# Patient Record
Sex: Male | Born: 1955 | Hispanic: No | Smoking: Former smoker
Health system: Southern US, Community
[De-identification: ages and names within clinical notes are randomized; demographics above are authoritative.]

## PROBLEM LIST (undated history)

## (undated) DIAGNOSIS — E785 Hyperlipidemia, unspecified: Secondary | ICD-10-CM

## (undated) DIAGNOSIS — N189 Chronic kidney disease, unspecified: Secondary | ICD-10-CM

## (undated) DIAGNOSIS — I219 Acute myocardial infarction, unspecified: Secondary | ICD-10-CM

## (undated) DIAGNOSIS — I2510 Atherosclerotic heart disease of native coronary artery without angina pectoris: Secondary | ICD-10-CM

## (undated) DIAGNOSIS — I739 Peripheral vascular disease, unspecified: Secondary | ICD-10-CM

## (undated) DIAGNOSIS — I495 Sick sinus syndrome: Secondary | ICD-10-CM

## (undated) DIAGNOSIS — I509 Heart failure, unspecified: Secondary | ICD-10-CM

## (undated) DIAGNOSIS — Z95810 Presence of automatic (implantable) cardiac defibrillator: Secondary | ICD-10-CM

## (undated) HISTORY — PX: ANGIOPLASTY: SHX39

## (undated) HISTORY — PX: CORONARY ARTERY BYPASS GRAFT: SHX141

## (undated) HISTORY — DX: Chronic kidney disease, unspecified: N18.9

## (undated) HISTORY — DX: Hyperlipidemia, unspecified: E78.5

## (undated) HISTORY — DX: Sick sinus syndrome: I49.5

## (undated) HISTORY — DX: Atherosclerotic heart disease of native coronary artery without angina pectoris: I25.10

## (undated) HISTORY — DX: Heart failure, unspecified: I50.9

## (undated) HISTORY — DX: Peripheral vascular disease, unspecified: I73.9

## (undated) HISTORY — PX: HERNIA REPAIR: SHX51

## (undated) HISTORY — PX: CERVICAL FUSION: SHX112

## (undated) HISTORY — DX: Presence of automatic (implantable) cardiac defibrillator: Z95.810

---

## 2000-06-13 ENCOUNTER — Ambulatory Visit (HOSPITAL_COMMUNITY): Admission: RE | Admit: 2000-06-13 | Discharge: 2000-06-13 | Payer: Self-pay | Admitting: Infectious Diseases

## 2000-06-13 ENCOUNTER — Encounter: Payer: Self-pay | Admitting: Infectious Diseases

## 2006-02-23 ENCOUNTER — Encounter: Payer: Self-pay | Admitting: Vascular Surgery

## 2006-02-23 ENCOUNTER — Inpatient Hospital Stay (HOSPITAL_COMMUNITY): Admission: EM | Admit: 2006-02-23 | Discharge: 2006-03-04 | Payer: Self-pay | Admitting: Emergency Medicine

## 2007-06-07 ENCOUNTER — Ambulatory Visit: Payer: Self-pay | Admitting: Vascular Surgery

## 2007-06-20 ENCOUNTER — Ambulatory Visit: Payer: Self-pay | Admitting: Vascular Surgery

## 2007-09-26 ENCOUNTER — Ambulatory Visit: Payer: Self-pay | Admitting: Vascular Surgery

## 2007-12-28 ENCOUNTER — Encounter: Payer: Self-pay | Admitting: Endocrinology

## 2008-01-26 ENCOUNTER — Ambulatory Visit: Payer: Self-pay | Admitting: Endocrinology

## 2008-01-26 DIAGNOSIS — E0590 Thyrotoxicosis, unspecified without thyrotoxic crisis or storm: Secondary | ICD-10-CM | POA: Insufficient documentation

## 2008-01-26 DIAGNOSIS — F39 Unspecified mood [affective] disorder: Secondary | ICD-10-CM

## 2008-01-26 DIAGNOSIS — I10 Essential (primary) hypertension: Secondary | ICD-10-CM | POA: Insufficient documentation

## 2008-01-31 ENCOUNTER — Encounter: Admission: RE | Admit: 2008-01-31 | Discharge: 2008-01-31 | Payer: Self-pay | Admitting: Endocrinology

## 2008-02-06 ENCOUNTER — Telehealth (INDEPENDENT_AMBULATORY_CARE_PROVIDER_SITE_OTHER): Payer: Self-pay | Admitting: *Deleted

## 2008-09-24 ENCOUNTER — Ambulatory Visit: Payer: Self-pay | Admitting: Vascular Surgery

## 2009-02-03 LAB — LIPID PANEL
Cholesterol: 163 mg/dL (ref 0–200)
HDL: 47 mg/dL (ref 35–70)
LDL Cholesterol: 92 mg/dL
Triglycerides: 119 mg/dL (ref 40–160)

## 2009-02-07 ENCOUNTER — Ambulatory Visit (HOSPITAL_COMMUNITY): Admission: RE | Admit: 2009-02-07 | Discharge: 2009-02-07 | Payer: Self-pay | Admitting: Cardiovascular Disease

## 2009-02-27 ENCOUNTER — Encounter (HOSPITAL_COMMUNITY): Admission: RE | Admit: 2009-02-27 | Discharge: 2009-05-28 | Payer: Self-pay | Admitting: Cardiovascular Disease

## 2009-07-25 LAB — BASIC METABOLIC PANEL
BUN: 27 mg/dL — AB (ref 4–21)
Creatinine: 2.1 mg/dL — AB (ref <=1.3)

## 2009-09-19 ENCOUNTER — Ambulatory Visit: Payer: Self-pay | Admitting: Vascular Surgery

## 2010-05-29 ENCOUNTER — Ambulatory Visit: Payer: Self-pay | Admitting: Vascular Surgery

## 2010-06-05 ENCOUNTER — Ambulatory Visit (HOSPITAL_COMMUNITY): Admission: RE | Admit: 2010-06-05 | Discharge: 2010-06-05 | Payer: Self-pay | Admitting: General Surgery

## 2010-09-03 ENCOUNTER — Encounter: Payer: Self-pay | Admitting: Cardiovascular Disease

## 2010-09-03 DIAGNOSIS — E785 Hyperlipidemia, unspecified: Secondary | ICD-10-CM | POA: Insufficient documentation

## 2010-09-03 DIAGNOSIS — N189 Chronic kidney disease, unspecified: Secondary | ICD-10-CM | POA: Insufficient documentation

## 2010-09-03 DIAGNOSIS — I252 Old myocardial infarction: Secondary | ICD-10-CM | POA: Insufficient documentation

## 2010-09-03 DIAGNOSIS — Z951 Presence of aortocoronary bypass graft: Secondary | ICD-10-CM | POA: Insufficient documentation

## 2010-09-03 DIAGNOSIS — I2510 Atherosclerotic heart disease of native coronary artery without angina pectoris: Secondary | ICD-10-CM | POA: Insufficient documentation

## 2010-09-03 DIAGNOSIS — I739 Peripheral vascular disease, unspecified: Secondary | ICD-10-CM | POA: Insufficient documentation

## 2010-09-03 DIAGNOSIS — I5022 Chronic systolic (congestive) heart failure: Secondary | ICD-10-CM | POA: Insufficient documentation

## 2010-09-25 ENCOUNTER — Ambulatory Visit (INDEPENDENT_AMBULATORY_CARE_PROVIDER_SITE_OTHER): Payer: Self-pay | Admitting: Cardiovascular Disease

## 2010-09-25 DIAGNOSIS — E7800 Pure hypercholesterolemia, unspecified: Secondary | ICD-10-CM

## 2010-09-25 DIAGNOSIS — Z951 Presence of aortocoronary bypass graft: Secondary | ICD-10-CM

## 2010-10-28 LAB — SURGICAL PCR SCREEN: Staphylococcus aureus: POSITIVE — AB

## 2010-10-28 LAB — COMPREHENSIVE METABOLIC PANEL
ALT: 25 U/L (ref 0–53)
AST: 22 U/L (ref 0–37)
Albumin: 4.2 g/dL (ref 3.5–5.2)
Alkaline Phosphatase: 35 U/L — ABNORMAL LOW (ref 39–117)
BUN: 20 mg/dL (ref 6–23)
CO2: 26 mEq/L (ref 19–32)
Calcium: 9.5 mg/dL (ref 8.4–10.5)
Chloride: 106 mEq/L (ref 96–112)
Creatinine, Ser: 1.96 mg/dL — ABNORMAL HIGH (ref 0.4–1.5)
GFR calc Af Amer: 43 mL/min — ABNORMAL LOW (ref >60)
GFR calc non Af Amer: 36 mL/min — ABNORMAL LOW (ref >60)
Glucose, Bld: 99 mg/dL (ref 70–99)
Potassium: 4.6 mEq/L (ref 3.5–5.1)
Sodium: 138 mEq/L (ref 135–145)
Total Bilirubin: 0.7 mg/dL (ref 0.3–1.2)
Total Protein: 7.5 g/dL (ref 6.0–8.3)

## 2010-10-28 LAB — CBC
HCT: 42.9 % (ref 39.0–52.0)
Hemoglobin: 14.7 g/dL (ref 13.0–17.0)
MCH: 31 pg (ref 26.0–34.0)
MCHC: 34.3 g/dL (ref 30.0–36.0)
MCV: 90.2 fL (ref 78.0–100.0)
Platelets: 177 10*3/uL (ref 150–400)
RBC: 4.75 MIL/uL (ref 4.22–5.81)
RDW: 13.9 % (ref 11.5–15.5)
WBC: 8.1 10*3/uL (ref 4.0–10.5)

## 2010-10-28 LAB — DIFFERENTIAL
Basophils Absolute: 0 10*3/uL (ref 0.0–0.1)
Basophils Relative: 1 % (ref 0–1)
Eosinophils Absolute: 0.2 10*3/uL (ref 0.0–0.7)
Eosinophils Relative: 3 % (ref 0–5)
Lymphocytes Relative: 18 % (ref 12–46)
Lymphs Abs: 1.4 10*3/uL (ref 0.7–4.0)
Monocytes Absolute: 0.6 10*3/uL (ref 0.1–1.0)
Monocytes Relative: 8 % (ref 3–12)
Neutro Abs: 5.8 10*3/uL (ref 1.7–7.7)
Neutrophils Relative %: 71 % (ref 43–77)

## 2010-10-28 LAB — PROTIME-INR
INR: 0.96 (ref 0.00–1.49)
Prothrombin Time: 13 seconds (ref 11.6–15.2)

## 2010-11-23 ENCOUNTER — Other Ambulatory Visit: Payer: Self-pay | Admitting: Cardiovascular Disease

## 2010-11-23 DIAGNOSIS — I10 Essential (primary) hypertension: Secondary | ICD-10-CM

## 2010-11-23 LAB — BASIC METABOLIC PANEL
BUN: 21 mg/dL (ref 6–23)
CO2: 18 mEq/L — ABNORMAL LOW (ref 19–32)
Calcium: 9.4 mg/dL (ref 8.4–10.5)
Chloride: 107 mEq/L (ref 96–112)
Creatinine, Ser: 1.78 mg/dL — ABNORMAL HIGH (ref 0.4–1.5)
GFR calc Af Amer: 48 mL/min — ABNORMAL LOW (ref >60)
GFR calc non Af Amer: 40 mL/min — ABNORMAL LOW (ref >60)
Glucose, Bld: 131 mg/dL — ABNORMAL HIGH (ref 70–99)
Potassium: 4.4 mEq/L (ref 3.5–5.1)
Sodium: 139 mEq/L (ref 135–145)

## 2010-11-23 LAB — CBC
HCT: 42.1 % (ref 39.0–52.0)
Hemoglobin: 14.2 g/dL (ref 13.0–17.0)
MCHC: 33.8 g/dL (ref 30.0–36.0)
MCV: 91 fL (ref 78.0–100.0)
Platelets: 163 10*3/uL (ref 150–400)
RBC: 4.62 MIL/uL (ref 4.22–5.81)
RDW: 13.6 % (ref 11.5–15.5)
WBC: 6.1 10*3/uL (ref 4.0–10.5)

## 2010-11-23 LAB — PROTIME-INR
INR: 1 (ref 0.00–1.49)
Prothrombin Time: 13.5 seconds (ref 11.6–15.2)

## 2010-11-23 LAB — APTT: aPTT: 28 seconds (ref 24–37)

## 2010-11-23 MED ORDER — LISINOPRIL 5 MG PO TABS: 5.0000 mg | ORAL_TABLET | Freq: Every day | ORAL | Status: DC

## 2010-11-23 NOTE — Telephone Encounter (Signed)
Fax received from pharmacy. Jodette Suheyb Raucci RN  

## 2010-11-24 ENCOUNTER — Other Ambulatory Visit: Payer: Self-pay | Admitting: Cardiovascular Disease

## 2010-11-24 DIAGNOSIS — I10 Essential (primary) hypertension: Secondary | ICD-10-CM

## 2010-11-24 MED ORDER — LISINOPRIL 5 MG PO TABS: 5.0000 mg | ORAL_TABLET | Freq: Every day | ORAL | Status: DC

## 2010-11-24 NOTE — Telephone Encounter (Signed)
Fax received from pharmacy. Refill completed. Jodette Cyntia Staley RN  

## 2010-12-25 ENCOUNTER — Other Ambulatory Visit: Payer: Self-pay | Admitting: Cardiovascular Disease

## 2010-12-29 NOTE — Assessment & Plan Note (Signed)
OFFICE VISIT   Logan Burton, Logan Burton  DOB:  1956-03-31                                       09/26/2007  CHART#:[id number]   I saw the patient in the office today for continued followup of his  peripheral vascular disease.  I had originally seen him in consultation  November 2008.  At that time, he had evidence of bilateral infrainguinal  arterial occlusive disease.  ABI on the right was 45% and on the left  59%.  He had successfully quit smoking after his open heart surgery in  July 2007.  We discussed the importance of a structured walking program,  and I also wrote him a prescription for cilostazol.  He comes in for a  routine followup visit.   Since I saw him last, he states the symptoms have improved somewhat.  He  is able to walk approximately 1/2 mile before experiencing symptoms.  His symptoms are more significant on the right side.  He experiences  calf claudication, which is relieved with rest.  He has had no history  of rest pain and no history of non-healing wounds.   REVIEW OF SYSTEMS:  He has had no recent chest pain, chest pressure,  palpitations, or arrhythmias.  He has had no bronchitis, asthma, or wheezing.   PHYSICAL EXAMINATION:  This is a pleasant 55 year old gentleman who  appears his stated age.  Blood pressure 122/80, heart rate is 67.  I do  not detect any carotid bruits.  Lungs clear bilaterally to auscultation.  On cardiac exam, he has a regular rate and rhythm.  His abdomen is soft  and nontender.  He has palpable femoral pulses.  I cannot palpate  popliteal or pedal pulses on either side.  Both feet appear adequately  perfused.  He has no ischemic ulcers on his feet.  He has no significant  lower extremity swelling.   Doppler studies in our office today show ABI of 60% on the right and 83%  on the left.  Both of these have improved compared to the study in  November.  He has monophasic Doppler signals in both feet.   Overall, I am  pleased with his progress.  I have written a prescription  for more cilostazol, as he does think that this helps.  I plan on seeing  him back in 1 year.  He knows to call sooner if he has problems.  We  have again discussed the importance of staying active and trying to get  on a structured walking program.   Di Kindle. Edilia Bo, M.D.  Electronically Signed   CSD/MEDQ  D:  09/26/2007  T:  09/28/2007  Job:  [id number]   cc:   Vesta Mixer, M.D.

## 2010-12-29 NOTE — Assessment & Plan Note (Signed)
OFFICE VISIT   Logan Burton, Logan Burton  DOB:  Dec 21, 1955                                       09/24/2008  CHART#:[id number]   I saw the patient in the office today for continued followup of his  peripheral vascular disease.  I last saw him a year ago in February  2009.  Since that time his claudication symptoms have remained stable.  He experiences claudication in both calves associated with ambulation  and relieved with rest.  He can walk for half a mile before experiencing  symptoms.  His symptoms have been stable and have not changed  significantly.  There are no other aggravating or alleviating factors.  He denies any history of rest pain and he has had no history of  nonhealing ulcers.   PAST MEDICAL HISTORY:  Significant for previous myocardial infarction in  2007 and also a history of hypertension.   SOCIAL HISTORY:  He quit tobacco in 2007 after his heart attack.   REVIEW OF SYSTEMS:  He has had no recent chest pain, chest pressure,  palpitations or arrhythmias.  He has had no bronchitis, asthma or  wheezing.   PHYSICAL EXAMINATION:  This is a pleasant 55 year old gentleman who  appears his stated age.  Blood pressure is 116/72, heart rate is 73.  Neck is supple.  There is no cervical lymphadenopathy.  I did not detect  any carotid bruits.  Lungs are clear bilaterally to auscultation.  Cardiac exam: He has a regular rate and rhythm.  He has a diminished  right femoral pulse with a palpable left femoral pulse.  I cannot  palpate popliteal or pedal pulses on either side.  Both feet appear  adequately perfused without ischemic ulcers.   Doppler study in our office today shows an ABI of 61% on the right and  79% on the left.  These are stable compared to a year ago.  On the right  side he has a monophasic dorsalis pedis and posterior tibial signal.  On  the left side he has a biphasic posterior tibial signal and a monophasic  dorsalis pedis signal.   Overall, I am pleased with his progress and I have encouraged him to  continue to stay on his structured walking program.  We will see him  back in 1 year for followup ABIs.  He knows to call sooner if he has  problems.   Di Kindle. Edilia Bo, M.D.  Electronically Signed   CSD/MEDQ  D:  09/24/2008  T:  09/25/2008  Job:  [id number]   cc:   Vesta Mixer, M.D.

## 2010-12-29 NOTE — Cardiovascular Report (Signed)
NAMEOLUWADARASIMI, REDMON NO.:  [id number]   MEDICAL RECORD NO.:  [id number]          PATIENT TYPE:  OIB   LOCATION:  2899                         FACILITY:  [facility]   PHYSICIAN:  Vesta Mixer, M.D. DATE OF BIRTH:  [birth year]   DATE OF PROCEDURE:  02/07/2009  DATE OF DISCHARGE:  02/07/2009                            CARDIAC CATHETERIZATION   Kolston Lacount is a 55 year old gentleman with a history of an inferolateral  myocardial infarction.  He underwent emergent coronary artery bypass  grafting 3 years ago.  He has recently been having severe shortness of  breath with exertion.  He had a stress echocardiogram, which was  abnormal.  He had LV dilatation and developed severe shortness of  breath.  He does not have any specific chest pain.  He was referred for  heart catheterization for further evaluation.   PROCEDURE:  Left heart catheterization with coronary angiography.   Right femoral artery was easily cannulated using a modified Seldinger  technique.   HEMODYNAMICS:  LV pressure was 131/11 with an aortic pressure of 131/75.   Left anterior descending artery is essentially normal.   The left anterior descending artery is severely and diffusely diseased.  There was a long 70% stenosis in the mid vessel.  There was a 50%  stenosis at the insertion of the left internal mammary artery.  Competitive flow can be seen from LIMA.   The left circumflex artery is a large vessel.  It gives off a very large  obtuse marginal artery, which then quickly branches into two very large  branches.  These were previously bypassed.  There was a subtotal  occlusion just after the bifurcation of these vessels.  There was TIMI  grade 1 flow down these vessels.  There was no evidence of competitive  flow from the saphenous vein graft.   The right coronary artery is severely diseased and is small.  It  supplied a very small posterior descending artery.   The saphenous vein graft to  the posterior descending artery is a very  small graft, but without critical stenosis.  It anastomosis to a very  small posterior descending artery.   The saphenous vein graft to the obtuse marginal artery is occluded  proximally.   The left internal mammary artery is patent.  The vessel is somewhat  tortuous.  At the anastomosis to the LAD, there was a 50% stenosis.  There was evidence of competitive flow from the native LAD.   The left ventriculogram was performed in a 30 RAO position.  It reveals  moderate left ventricular dysfunction.  The apex contracts fairly well.  The anterior base and the posterolateral wall are somewhat akinetic.  The ejection fraction is 35-40%.   COMPLICATIONS:  None.   CONCLUSIONS:  1. Severe coronary artery disease.  His left circumflex system is now      un-revascularized.  His left anterior descending artery is      partially revascularize.  I do not think that we will be able to      improve the flow down its circumflex  system with angioplasty or      stenting.  We will review these films with the surgeons.      Vesta Mixer, M.D.  Electronically Signed     PJN/MEDQ  D:  02/07/2009  T:  02/08/2009  Job:  [id number]   cc:   Evelene Croon, M.D.

## 2010-12-29 NOTE — H&P (Signed)
Logan Burton, RADIN NO.:  [id number]   MEDICAL RECORD NO.:  [id number]           PATIENT TYPE:   LOCATION:                                 FACILITY:   PHYSICIAN:  Vesta Mixer, M.D.      DATE OF BIRTH:   DATE OF ADMISSION:  02/07/2009  DATE OF DISCHARGE:                              HISTORY & PHYSICAL   Mr. Cobern is a middle-aged gentleman with a history of coronary artery  disease.  He is status post coronary artery bypass grafting.  He was  admitted to the hospital after having an abnormal stress echocardiogram.   Mr. Seals is a 55 year old gentleman, who presented with an acute  inferolateral myocardial infarction in July 2007.  We were unable to  open his circumflex artery by angioplasty, and he went for urgent  coronary artery bypass grafting.  He had the left internal mammary  artery to the LAD, a sequential saphenous vein graft to the second and  third obtuse marginal artery and a saphenous vein graft to the posterior  descending artery.  The patient had somewhat of a slow recovery.  He  continued to have episodes of intermittent chest pain and some shortness  breath.  Recently, he complained of severe shortness of breath with  exertion.  These episodes occur any time he exerts himself.  He is able  to walk fairly slowly at a comfortable pace, but any time he walks a  bit, he becomes very short of breath.  He denies any real chest pain.  He denies any PND or orthopnea.  He denies any nausea, vomiting,  syncope, or presyncope.   He had a stress echocardiogram today.  He was able to walk for 6 minutes  on a standard Bruce protocol treadmill test.  With exercise, he had left  ventricular dilatation.  His overall ejection fraction decreased from  the 40% range down to about 30%.  He is scheduled for heart  catheterization for further evaluation.   CURRENT MEDICATIONS:  1. Metoprolol 75 mg twice a day.  2. Aspirin 325 mg a day.  3. Lisinopril 5 mg a  day.  4. Citalopram 40 mg a day.  5. TriCor 145 mg a day.  6. Crestor 20 mg a day.   ALLERGIES:  None.   PAST MEDICAL HISTORY:  1. Coronary artery disease - status post coronary artery bypass      grafting.  2. History of myocardial infarction.  3. Congestive heart failure with an ejection fraction of around 40%.  4. Chronic renal insufficiency.  His creatinine has slowly climbed up,      and his most recent creatinine level is 2.0.  5. Peripheral vascular disease.  This was followed by Dr. Cari Caraway.  He has episodes of claudication, practically in his right      leg.  His ABI on the right side was 61%, and his ABI on the left      side is 79%.  6. History dyslipidemia.   SOCIAL HISTORY:  The patient  has a history of smoking in the past but  does not smoke since his heart attack.  He does not drink alcohol.   FAMILY HISTORY:  Positive for cardiac disease.   REVIEW OF SYSTEMS:  Reviewed, in the HPI.  He denies any heat or cold  intolerance.  He denies any weight gain or weight loss.  He denies any  blood in his urine or blood in his stool.  He denies any rash or skin  nodules.  He denies any problems with his eyes, ears, nose, and throat.  He denies any skin problems.  All other systems were reviewed and are  negative.   PHYSICAL EXAMINATION:  GENERAL:  He is a middle-aged gentleman, in no  acute distress.  He is alert and oriented x3, and his mood and affect  are normal.  NECK:  Supple.  He has 2+ carotids.  There are no bruits.  He has no  JVD.  HEENT:  His sclerae are nonicteric.  His mucous membranes are moist.  LUNGS:  Clear.  BACK:  Nontender.  HEART:  Regular rate.  His PMI is nondisplaced.  ABDOMEN:  Good bowel sounds.  There is no hepatosplenomegaly.  There is  no guarding or rebound.  EXTREMITIES:  No clubbing, cyanosis, or edema.  He has 1+ pulses in the  femoral arteries.  There is no palpable cords.  There is no rash.  SKIN:  Unremarkable.   NEUROLOGIC:  Nonfocal.  His gait is normal.   Mr. Jaros presents with dyspnea on exertion and has a positive stress  echocardiogram.  He has left ventricular dilatation with reduced left  ventricular systolic function with exercise.  We will need to proceed  with heart catheterization for further evaluation.  We have discussed  the risks, benefits, and options of heart catheterization.  He  understands and agrees to proceed.      Vesta Mixer, M.D.  Electronically Signed     PJN/MEDQ  D:  02/05/2009  T:  02/06/2009  Job:  [id number]   cc:   Evelene Croon, M.D.  Oley Balm Georgina Pillion, M.D.

## 2010-12-29 NOTE — Consult Note (Signed)
NEW PATIENT CONSULTATION   Logan Burton, Logan Burton  DOB:  08-Aug-1956                                       06/20/2007  CHART#:[id number]   I saw Logan Burton in the office today in consultation concerning his  bilateral lower extremity claudication.  He was referred by Vesta Mixer, M.D.   This is a pleasant 55 year old gentleman who states that he has pain in  his both calves associated with ambulation and relieved with rest for  over a year.  His symptoms have gradually progressed over the last six  months.  He occasionally gets paresthesias on his right foot.  He has  had no significant thigh or hip claudication.  He denies any history of  rest pain in his feet and has had no history of nonhealing ulcers.  His  symptoms occur at approximately 400 feet.   PAST MEDICAL HISTORY:  Significant for:  1. Coronary artery disease.  He underwent coronary revascularization      by Evelene Croon, M.D. in July of 2007.  2. In addition he has hypertension and hypercholesterolemia.  3. He denies any history of diabetes, history of previous myocardial      infarction, history of congestive heart failure or history of COPD.   PAST MEDICAL HISTORY:  Significant for:  1. Coronary artery bypass surgery in July of 2007.  2. Cervical disk surgery in 1996.   FAMILY HISTORY:  There is no history of premature cardiovascular  disease.   SOCIAL HISTORY:  He is divorced.  He does not have any children.  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  He had smoked  two packs-per-day of cigarettes since he was 55 years old, but quit when  he had his open heart surgery in July of 2007.   MEDICATIONS:  1. Aspirin 325 mg daily.  2. Metoprolol.  3. Lisinopril.  4. Citalopram.  5. Simvastatin.   REVIEW OF SYSTEMS:  GENERAL:  He has about a 20 pounds weight gain since  his coronary artery bypass surgery which maybe related to his tobacco  cessation.  He has had no problems  with his appetite or fever.  CARDIAC:  He does admit to dyspnea on exertion.  ORTHO/__________  :  He does  occasionally get some joint pain.  PULMONARY, GI, GU, NEURO,  PSYCHIATRIC, ENT AND HEMATOLOGIC REVIEW OF SYSTEMS:  Unremarkable.   PHYSICAL EXAMINATION:  This is a pleasant 55 year old gentleman who  appears his stated age and is in no acute distress.  Blood pressure is 123/73, heart rate is 78, saturation is 98%.  HEENT:  I do not detect any carotid bruits.  There is no cervical  lymphadenopathy.  LUNGS:  Clear bilaterally to auscultation.  CARDIAC EXAM:  He has a regular rate and rhythm.  ABDOMEN:  Soft and nontender.  He has normal pitch bowel sounds.  VASCULAR EXAM:  Reveals palpable radial and femoral pulses bilaterally.  I cannot palpate popliteal or pedal pulses on either side.  EXTREMITIES EXAM:  Reveals no significant lower extremity swelling.  He  has had vein harvested from the right leg previously for his open heart  surgery.  He has no ischemic ulcers and no rashes.  NEUROLOGIC EXAM:  He has no focal weakness or paresthesias.   He did have Doppler studies done in  our office which showed an ABI of  45% on the right and 59% on the left.  Doppler signals are monophasic in  both feet.   Logan Burton has evidence of infrainguinal arterial occlusive disease  bilaterally with fairly stable claudication.  I have encouraged him to  remain off of his cigarettes and he seems highly motivated for this.  We  have also discussed the importance of a structured walking program.  In  addition, I have given him a prescription for cilostazol.  We will try  to avoid revascularization unless his symptoms progress and became  disabling or he developed rest pain and a nonhealing ulcer.  I plan on  seeing him back in four months to follow his progress.  He knows to call  sooner if he has problems.  He understands that if his symptoms progress  we would proceed with  arteriography to see what  options we might have for revascularization.  Of note his options on the right will be limited as he has previously  had his vein taken for his open heart surgery.   Di Kindle. Edilia Bo, M.D.  Electronically Signed   CSD/MEDQ  D:  06/20/2007  Burton:  06/21/2007  Job:  [id number]   cc:   Vesta Mixer, M.D.

## 2011-01-01 NOTE — Cardiovascular Report (Signed)
NAMEJUANCARLOS, CRESCENZO NO.:  [id number]   MEDICAL RECORD NO.:  [id number]          PATIENT TYPE:  INP   LOCATION:  2807                         FACILITY:  [facility]   PHYSICIAN:  Vesta Mixer, M.D. DATE OF BIRTH:  1956-07-30   DATE OF PROCEDURE:  02/23/2006  DATE OF DISCHARGE:                              CARDIAC CATHETERIZATION   Mr. Hochmuth is a 55 year old gentleman with a history of hypertension and  cigarette smoking.  He presents to the hospital with 4 days of chest pain.  He was found to have ST-segment elevation in the inferolateral leads.  He is  referred for heart catheterization and emergent PCI.   PROCEDURE:  Left heart catheterization.  The attempt at PTCA of the first  obtuse marginal artery was unsuccessful.   The right femoral artery was easily cannulated using a modified Seldinger  technique.  The patient received 4000 units of heparin and a double-bolus  Integrilin drip after we gained access.  Because of a smoking history he was  having trouble maintaining his oxygen saturations.  We put him on 100% non-  rebreather resulting in 100% saturation of his arterial blood.   HEMODYNAMICS:  LV pressure was 113/22 with an aortic pressure of 115/73.   ANGIOGRAPHY:  Left main:  There are minor luminal irregularities.   The left anterior descending artery has proximal 50% stenosis just prior to  the first septal branch.  There is then an eccentric 90% stenosis.  There  are mild to moderate irregularities following this stenosis between 30% and  40%.  There are several small diagonal branches but no large diagonal  branches.   The left circumflex artery is an extremely large and dominant vessel.  There  is a very small first marginal vessel that has only minimal luminal  irregularities.  There appears to be a large second obtuse marginal artery  that originates at a bend in the vessel.  This second obtuse marginal artery  is occluded.  There is a very  faint amount of blood flow that seems to be  going down into that vessel although it is not clear.   The distal circumflex continues around in the AV groove.  There is a 60%  stenosis after the takeoff of the second obtuse marginal artery.  There is a  70-80% stenosis in the vessel as it gives off the posterior descending  artery.   The right coronary artery is very small and nondominant.  It supplies artery  marginal branches only.  It does supply a very, very tiny amount of the  inferior left ventricle.   Left ventriculogram was performed in a 30 RAO position.  It reveals global  left ventricular dysfunction with an ejection fraction of around 30%.  There  is lateral akinesis.  There is global hypokinesis.  There is preserved  contractility of the anterior wall.  There is moderate to severe hypokinesis  of the inferior wall.   PTCA:  The 6-French sheath was traded out for a 7-French sheath.  The left  main was engaged using a Judkins  left 4, 7-French guide.  The ACT was 234.   An Clinical cytogeneticist wire was placed down this vessel.  We made multiple  attempts to get down into the second obtuse marginal artery.  We were able  to track partially down but were never able to actually establish the fact  that we were in the lumen.  A followup angiography revealed just persistent  dye staining as it had been previously.  We were clearly just embedded in  the plaque and were not successful in getting across this stenosis.  The  stenosis was very hard and difficult and it feels to be much older than 65  days old.  After much thought and consideration we decided to stop the  procedure at this time.  It appears that further attempts would possibly  result in dissection of the vessel.  The troponin at this point came back at  45, indicating that this infarction has been going on for quite some time.  There is probably very little to gain from further attempts and, in fact,  very little to gain by  opening the vessel acutely.  Given the fact that  there is considerable risk of dissection, we decided to stop the procedure.   The patient needs to have his left anterior descending artery  revascularized.  Our options would include a very long stent of the proximal  and mid LAD.  The tight stenosis has at least 30 mm of moderate disease just  prior to that where it would not be feasible to the land the proximal edge  of a stent.  We may need to consider coronary artery bypass grafting.  We  will review the films.  The patient is stable leaving laboratory and in fact  has stated that his chest pain is better.  The patient has been advised to  stop smoking.   The patient received metoprolol 5 mg x2.  This helped with his tachycardia  and helped relieve his pain.           ______________________________  Vesta Mixer, M.D.     PJN/MEDQ  D:  02/23/2006  T:  02/23/2006  Job:  [id number]   cc:   Oley Balm. Georgina Pillion, M.D.  Fax: [phone]

## 2011-01-01 NOTE — Op Note (Signed)
NAMEHARUTYUN, Logan Burton NO.:  [id number]   MEDICAL RECORD NO.:  [id number]          PATIENT TYPE:  INP   LOCATION:  2301                         FACILITY:  [facility]   PHYSICIAN:  Kaylyn Layer. Michelle Piper, M.D.   DATE OF BIRTH:  [birth year]   DATE OF PROCEDURE:  DATE OF DISCHARGE:                                 OPERATIVE REPORT   PROCEDURE:  Transesophageal echocardiographic probe placement and monitoring  during coronary artery bypass grafting.   HISTORY:  I was consulted by Dr. Evelene Croon to place a transesophageal  echocardiogram probe into the patient who presented after having had a  significant myocardial infarction for coronary artery bypass grafting.  It  was noted on his catheterization that his inferior and lateral walls had  significant wall motion abnormalities and with a depressed ventricle in the  face of an acute MI, indications for transesophageal echocardiography was  there.   DESCRIPTION OF PROCEDURE:  After uneventful induction of anesthesia and  endotracheal intubation, the transesophageal echocardiogram was easily  passed into the patient's stomach.  Initial short access view of the left  ventricle showed some inferior wall hypokinesis as well as significant  lateral dyskinesis, however, the septal function was normal.  Turning to the  mitral valve, the valve leaflets capped normally.  Structure of the valve  looked normal in all views.  Placing color flow Doppler across the valve was  trace to 1+ mitral regurgitation into the left atrium.  Left atrium was  normal flow and the pulmonary veins was forward.  Interatrial septum was  normal.  Turning to the aortic valve, valve was tri-leaflet.  Structure  looked normal and there was no significance on placing color flow across the  valve and in the left ventricular outflow tract there was no evidence of  aortic insufficiency or aortic valve disorders.  Right side of the heart was  within normal limits.   The patient was placed on cardiopulmonary bypass by Dr. Laneta Simmers and underwent  coronary artery bypass grafting times 4.  At the end of the bypass just  prior to discontinuation from cardiopulmonary bypass, did another evaluation  which was grossly unchanged.  We discontinued from cardiopulmonary bypass  with minimal inotropes.  At this time the left ventricle showed depressed  function in the inferior wall on the cross section view as well as the  akinesis of the lateral wall.  The inferior wall looked somewhat improved  from preoperative.  Mitral valve, left atrium, aortic valve, interatrial  septum and right side of the heart all were unchanged from preoperative.   At the end of the procedure the transesophageal echo probe was removed from  the patient and the patient was taken in stable condition to the Intensive  Care Unit.           ______________________________  Kaylyn Layer. Michelle Piper, M.D.     KDO/MEDQ  D:  02/25/2006  T:  02/26/2006  Job:  [id number]   cc:   ANESTHESIA DEPARTMENT

## 2011-01-01 NOTE — Discharge Summary (Signed)
Logan Burton, Logan Burton NO.:  [id number]   MEDICAL RECORD NO.:  [id number]          PATIENT TYPE:  INP   LOCATION:  2037                         FACILITY:  [facility]   PHYSICIAN:  Evelene Croon, M.D.     DATE OF BIRTH:  04/29/1956   DATE OF ADMISSION:  02/23/2006  DATE OF DISCHARGE:                                 DISCHARGE SUMMARY   PRIMARY DIAGNOSIS:  Severe 3 vessel coronary artery disease status post  large inferior lateral myocardial infarction.   IN HOSPITAL DIAGNOSES:  1.  Pericarditis.  2.  Acute blood loss anemia postoperatively.  3.  Postoperative pulmonary edema.  4.  Volume overload postoperatively.   SECONDARY DIAGNOSES:  1.  Hypertension.  2.  Hyperlipidemia.  3.  Status post neck surgery for disk disease.  4.  History of lower spinal disk bulging.   IN HOSPITAL OPERATIONS/PROCEDURES:  1.  Cardiac catheterization with left heart catheterization in attempt at      PTCA of the first obtuse marginal artery, which was unsuccessful.  2.  Emergency coronary artery bypass grafting x4, using left internal      mammary artery graft to left anterior descending coronary      artery,sequential saphenous vein graft second, third obtuse marginal      branches of the left circumflex coronary, saphenous vein graft to      posterior descending branch of left circumflex coronary.  Saphenous vein      harvesting from right leg was done.  3.  Transesophageal echocardiogram cardiographic probe placement and      monitoring during coronary artery bypass grafting.   HISTORY AND PHYSICAL AND HOSPITAL COURSE:  The patient is a 55 year old  gentleman, who was admitted on February 23, 2006 with a 4-day history of chest  pain.  Electrocardiogram was done, which showed a inferior lateral ST-  elevation.  His initial CPK was elevated 2,999 with an MB of 277 and  troponin of 0.45.  The patient was taken to the Cath Lab emergently and this  showed the culprit to be a completely  occluded second marginal branch.  Left  circumflex is the dominant vessel.  There is a small first marginal branch.  Second marginal could not be visualized, it appears to cover a large area.  There are very faint collaterals to the lateral wall.  There is also 70% to  80% systolic circumflex stenosis before the posterior descending branch.  The right coronary with a nondominant  vessel had about 60% to 70% proximal  stenosis.  The LAD with large vessel was about 50% proximal disease defuse  and then 90% stenosis proximally.  Ejection fraction was about 30%.  Following catheterization, Dr.  Laneta Simmers was consulted.  The patient continued  to have some mild chest discomfort as well as pain in his left shoulder,  which was felt to be pericarditis.  He did have some asymmetric pulmonary  edema in chest x-ray.  The patient was started on Integrilin and was  continued to follow closely.  The plan was for patient to undergo surgery on  Monday February 28, 2006.  Cardiac enzymes were repeated, and CPK isoenzymes and  troponin slowly decreased as repeat electrocardiogram showed no acute  changes.  On February 25, 2006, the patient was seen by Dr. Elease Hashimoto, the patient  had stated that he did not experience any chest pain and was feeling quite  good.  Shortly after this, the patient began developing worsening pain in  his left chest and shoulder.  A repeat EKG was done, which showed no  significant changes.  He had no arrhythmias and remained hemodynamically  stable.  At that point, Dr. Laneta Simmers evaluated the patient and it was felt  mostly that this was ongoing pericarditis due to the large infarct.  Unfortunately, there is no way to be sure about this and Dr. Laneta Simmers  recommended undergoing emergency coronary artery bypass grafting.  He  discussed risks and benefits with the patient.  The patient acknowledged  understanding and agreed to proceed.   The patient was taken emergently to the operating room on February 25, 2006,  where he underwent emergent coronary artery bypass grafting x4 using a left  internal mammary artery graft to left anterior descending  coronary,sequential saphenous vein graft to second and third obtuse marginal  branches as well as circumflex coronary, saphenous vein graft to second and  third obtuse marginal branches as well as circumflex coronary, sequential  saphenous vein graft to posterior same branch of left circumflex coronary.  Endoscopic vein harvesting was done from the right leg.  The patient did  this procedure well and was transferred up to the Intensive Care Unit in  stable condition.  Immediately following surgery, the patient seemed to be  hemodynamically stable.  During his postoperative course he did develop  acute blood loss anemia.  Postoperatively, hemoglobin and hematocrit dropped  to 7.2 and 21.  The patient did receive 2 units of packed red blood cells at  that time .  Hemoglobin and hematocrit were monitored and it seemed to be  increased on postoperative day 2 to 8.4 and 25.  Over the next several days  this was monitored.  It remained stable.  By postoperative day 6, it had  increased to 9.6 and 29.4.  Also during the patient's postoperative course  he was seen to have persistent pulmonary edema.  He required excessive  diuresis.  He required BiPAP following extubation.  With excessive diuresis  and pulmonary toilet the patient was able to be weaned off BiPAP.  Chest x-  ray improved.  The patient continued to receive aggressive pulmonary toilet.  By postoperative day 6, he was able to be weaned off oxygen sating greater  than 90% on room air.  The patient's weight was also back near baseline  prior to discharge home.  Preoperative weight was 163 and he was at 164  pounds postoperative day 6.  The patient's chest tubes and lines were discontinued in the normal fashion.  He was transferred on postoperative day  4.  The patient did have some hypotensive  episodes and beta blockers as well  as HCTZ were held at that time.  Postoperative day 6, blood pressure  improved with systolic blood pressure being in the 120s.  He was started on  low-dose beta blocker at that time.  His blood pressure remained stable,  restart the patient's HCTZ.  The patient remained in normal sinus rhythm  during his postoperative course.  His incision was clean, dry and intact and  healing well.  He was  seen to have hyperglycemia preoperatively.  Did  require some Lantus insulin postoperatively.  He was able to be discontinued  from the Lantus insulin and site blood sugars remained stable.  The patient  was out of bed ambulating well.  He remained afebrile during his  postoperative course.  Creatinine was not entered and seemed to be stable as  well.  Last creatinine obtained was 7.3 on postoperative day 6.  A BNP was  checked on postoperative day 6, and seemed to be elevated at 1798.  The  patient was continued on diuretics and this was monitored.   DISCHARGE INSTRUCTIONS:  The patient pensively ready for discharge home in  the next 1 to 2 days.  A followup appointment has been arranged with Dr.  Laneta Simmers, for March 22, 2006 at 11:45 a.m.  The patient is to follow up with  Dr. Elease Hashimoto in 2 weeks.  He is to contact Dr. Elease Hashimoto for followup  appointment.  Mr. Plascencia received instructions on diet, activity level, and  incisional care.  He was told no driving till released to do so, no heavy  lifting over 10 pounds.  The patient told that he is allowed to shower,  washing his incisions using soap and waster.  He is to contact the office if  he develops any drainage or any openings from any of his incisions sites.  The patient was told to ambulate 3 to 4 times per day, progress as tolerated  and to continue his breathing exercises.  He was educated on diet to be low  fat, low salt.  He acknowledged understanding.   DISCHARGE MEDICATIONS:  1.  Aspirin 325 mg daily.  2.   Toprol XL 25 mg daily.  3.  Lexapro  20 mg daily.  4.  Diovan 160/25 mg daily.  5.  Oxycodone 50 mg 1-2 tabs every 4-6 hours p.r.n. pain.      Theda Belfast, [place]      Evelene Croon, M.D.  Electronically Signed    KMD/MEDQ  D:  03/03/2006  T:  03/03/2006  Job:  [id number]   cc:   Evelene Croon, M.D.  [address]  [place]  [place] [address]   Vesta Mixer, M.D.  Fax: [phone]

## 2011-01-01 NOTE — Op Note (Signed)
NAMEJEREMIE, GIANGRANDE NO.:  [id number]   MEDICAL RECORD NO.:  [id number]          PATIENT TYPE:  INP   LOCATION:  2301                         FACILITY:  [facility]   PHYSICIAN:  Evelene Croon, M.D.     DATE OF BIRTH:  [birth year]   DATE OF PROCEDURE:  02/25/2006  DATE OF DISCHARGE:                                 OPERATIVE REPORT   PREOPERATIVE DIAGNOSIS:  Severe three vessel coronary disease status post  large inferolateral myocardial infarction.   POSTOPERATIVE DIAGNOSIS:  Severe three vessel coronary disease status post  large inferolateral myocardial infarction.   OPERATIVE PROCEDURE:  Emergency median sternotomy, extracorporeal  circulation, coronary bypass graft surgery x4 using a left internal mammary  artery graft to the left anterior descending coronary, sequential saphenous  vein graft to the second and third obtuse marginal branches of the left  circumflex coronary, and a saphenous vein graft to the posterior descending  branch of the left circumflex coronary.  Endoscopic vein harvesting from the  right leg.   ATTENDING SURGEON:  Evelene Croon, M.D.   ASSISTANT:  Theda Belfast, P.A.-C.   ANESTHESIA:  General endotracheal.   CLINICAL HISTORY:  This patient is a 55 year old gentleman who was admitted  on February 23, 2006, with a four day history of chest pain.  Electrocardiogram  showed inferolateral ST elevation.  His initial CPK was elevated to 2999  with an MB 277 and troponin 0.45.  He was taken to the cath lab emergently  and this showed the culprit to be a completely occluded second marginal  branch.  The left circumflex was a dominant vessel.  There was a small first  marginal branch.  The second marginal could not be visualized but appeared  to cover a large area.  There were very faint collaterals to the lateral  wall.  There was also a 70-80% distal left circumflex stenosis before the  posterior descending branch.  The right coronary was a  nondominant vessel  that had about a 60-70% proximal stenosis.  The LAD was a large vessel was  50% proximal diffuse disease and then a 90% stenosis proximally.  Ejection  fraction was about 30%.  He continued to have some mild chest discomfort as  well as pain in his left shoulder which was felt to be pericarditis.  He did  have some asymmetric pulmonary edema on chest x-ray.  He was started on  Integrilin and we continued to follow him closely.  His CPK isoenzymes and  troponin enzymes slowly decreased as repeat electrocardiogram showed no  acute changes.  This morning when Dr. Elease Hashimoto saw him, he was not complaining  of any pain and looked quite good, but then shortly after that began  developing worsening pain in his left chest and shoulder.  When I saw him, I  felt that the pain was unchanged from his previous pain.  He did not have  any significant EKG changes.  He had no arrhythmias and remained  hemodynamically stable.  I felt this was most likely ongoing pericarditis,  it was a large  infarct, but there was no way to be sure about that, and  therefore, I recommended proceeding with coronary bypass graft surgery in  case his LAD lesion was causing ischemia.  I discussed the operative  procedure with the patient and his family including alternatives, benefits,  and risks including bleeding, blood transfusion, infection, stroke,  myocardial infarction, graft failure, and death.  He understood and agreed  to proceed.   OPERATIVE PROCEDURE:  The patient was taken to the operating room and placed  on the table in a supine position.  After the induction of general  endotracheal anesthesia, a Foley catheter was placed in the bladder using  sterile technique.  Then, the chest, abdomen, and both lower extremities  were prepped and draped in the usual sterile manner.  Transesophageal  echocardiogram was performed by anesthesiology.  This showed akinesis of the  lateral wall.  There was  anterior hypokinesis.  There was inferior  hypokinesis.  There is no significant mitral regurgitation.   Then, the chest was opened through a median sternotomy incision and the  pericardium opened in the midline.  Examination of the heart showed good  ventricular contractility.  The ascending aorta had no palpable plaques in  it.  There was a marked pericarditis present with turbid fluid present.  The  entire pericardial sac appeared inflamed.   Then, the left internal mammary artery was harvested from the chest wall as  a pedicle graft.  This was a medium caliber vessel with excellent blood flow  through it.  At the same time, a segment of greater saphenous vein was  harvested from the right leg using endoscopic vein harvest technique.  This  vein was of medium size and good quality.   Then, the patient was heparinized and when an adequate activated clotting  time was achieved, the distal ascending aorta was cannulated using a 20-  [place] aortic cannula for arterial in flow.  Venous outflow was achieved  using a two-stage venous cannula for the right atrial appendage.  An  antegrade cardioplegia and vent cannula was inserted in the aortic root.   The patient was placed on cardiopulmonary bypass and the distal coronaries  identified.  The LAD was a large graftable vessel that had moderate distal  disease in it.  The first marginal was a relatively small vessel.  The  second marginal was a large vessel.  There was also a large third marginal  branch which must have been also occluded.  There is a large area of infarct  around these two vessels encompassing most of the lateral wall.  The left  circumflex terminated as a moderate sized posterior descending branch.  The  right coronary was small and nondominant.  I tried locate it in the  atrioventricular groove but it was lying quite deep and was small and not  felt to be suitable for grafting.  Then, the aorta was crossclamped and 500  mL of cold blood antegrade  cardioplegia was administered in the aortic root with quick arrest the  heart.  Systemic hypothermia was used to 20 degrees centigrade and topical  hypothermic with iced saline was used.  A temperature probe was placed in  the septum and insulating pad in the pericardium.   The first distal anastomosis was performed to the second marginal branch.  The internal diameter was about 1.75 mm.  The conduit used was a segment of  greater saphenous vein.  Anastomosis was performed in a sequential side-to-  side  manner using continuous 7-0 Prolene suture.  Flow was administered  through the graft and was excellent.   The second distal anastomosis was performed to the third marginal branch.  The internal diameter was 1.75 mm.  The conduit used was the same segment of  greater saphenous vein.  The anastomosis was performed in a sequential end-  to-side manner using continuous 7-0 Prolene suture.  Flow was administered  through the graft and was excellent.  Then, a dose of cardioplegia was given  down the vein graft and in the aortic root.   A third distal anastomosis performed to the posterior descending branch of  the left circumflex coronary.  The internal diameter of about 1.6 mm.  The  conduit used was a second segment of greater saphenous vein. Anastomosis was  performed in an end-to-side manner continuous 7-0 Prolene suture.  Flow was  administered through the graft and was excellent.   The fourth distal anastomosis was performed to the mid portion of the left  anterior descending coronary.  The internal diameter of this vessel was  about 2.5 mm.  The conduit used was the left internal mammary graft and this  was brought through an opening in the left pericardium anterior to the  phrenic nerve.  It was anastomosed to the LAD in an end-to-side manner using  a continuous 8-0 Prolene suture.  The pedicle was sutured to the epicardium  with 6-0 Prolene sutures.  The  patient was rewarmed to 37 degrees  centigrade.  With the crossclamp in place, the two proximal vein graft  anastomoses were performed to the aortic root in an end-to-side manner using  continuous 6-0 Prolene suture.  Then, the clamp was removed from the mammary  pedicle.  There was rapid warming of the ventricular septum and return of  spontaneous ventricular fibrillation.  The crossclamp was removed with a  time of 72 minutes.  There was spontaneous return of sinus rhythm.   The proximal and distal anastomoses appeared hemostatic and the lie of the  grafts satisfactory.  Graft markers were placed around the proximal  anastomoses.  Two temporary right ventricular and right atrial pacing wires  were placed and brought out through the skin.  The patient was rewarmed to  37 degrees centigrade, he was weaned from cardiopulmonary bypass on no  inotropic agents.  Total bypass time was 98 minutes.  Cardiac function appeared excellent with a cardiac output of 5 liters minute.  Protamine was  given.  Venous and aortic cannulae were removed without difficulty.  The  patient was given 10 units of platelets since he was on Integrilin, aspirin,  Plavix, and was thrombocytopenia on pump with a platelet count 82,000.  This  resulted in adequate hemostasis.  Three chest tubes were placed, two in the  posterior pericardium, one in the left pleural space, one in the anterior  mediastinum.  The pericardium was not closed over the heart.  The sternum  was closed with #6 stainless steel wires.  The fascia was closed with  continuous #1 Vicryl suture.  The subcutaneous tissue was closed with  continuous 2-0 Vicryl and the skin with 3-0 Vicryl subcuticular closure.  The lower extremity vein harvest site was closed in layers in a similar  manner.  Sponge, needle, and instrument counts were correct according to the  scrub nurse.  Dry sterile dressings were applied over the incisions and  around the chest tubes  which were hooked to Pleur-Evac suction.  The patient  remained hemodynamically stable and was  transferred to the SICU in guarded  but stable condition.      Evelene Croon, M.D.  Electronically Signed     BB/MEDQ  D:  02/25/2006  T:  02/26/2006  Job:  [id number]   cc:   Vesta Mixer, M.D.  Fax: [phone]

## 2011-01-01 NOTE — H&P (Signed)
Logan Burton, BOECKMAN NO.:  [id number]   MEDICAL RECORD NO.:  [id number]          PATIENT TYPE:  INP   LOCATION:  2910                         FACILITY:  [facility]   PHYSICIAN:  Vesta Mixer, M.D. DATE OF BIRTH:  September 22, 1955   DATE OF ADMISSION:  02/23/2006  DATE OF DISCHARGE:                                HISTORY & PHYSICAL   HISTORY OF PRESENT ILLNESS:  Silvestre Mines is a 55 year old gentleman who  presented today as a code STEMI.  He had recently had onset of his chest  pain 4 days ago.  The pain was intermittent and was fairly severe.  It let  up a little bit one day, but then returned yesterday at 10:00 a.m..  He now  presents 24 hours later with persistent pain.  He originally presented to  his medical doctor.  He received nitroglycerin and became very hypotensive.  He was then brought to the emergency room for further management.   He was found to have ST-segment elevation on his EKG.  He was taken urgently  to the cath lab based on these results.   The patient has persistent episodes of chest pain.  The pain has eased up a  little bit since it started yesterday.  It is now perhaps a 5-6/10.   He had low saturations while he was in the emergency room.  He required 100%  non-rebreather.  The patient is two pack a day smoker and probably has  significant lung disease.   CURRENT MEDICATIONS:  1.  Diovan HCT 160 mg/25 mg once a day.  2.  Toprol XL 100 mg a day.  3.  Lexapro 20 mg a day.   ALLERGIES:  NO KNOWN DRUG ALLERGIES.   PAST MEDICAL HISTORY:  Hypertension.   SOCIAL HISTORY:  The patient smokes two packs a day.  He works as a  Pensions consultant for [employer].  He used to work at [employer].   FAMILY HISTORY:  Noncontributory.   REVIEW OF SYSTEMS:  As per HPI.   PHYSICAL EXAMINATION:  GENERAL:  He is a middle-aged gentleman in no acute  distress.  He is alert and oriented x3, and his mood and affect are normal.  VITAL SIGNS:  His blood pressure is  115/75, heart rate is 95.  HEENT:  2+ carotids.  He has no bruits, no JVD, no thyromegaly.  LUNGS:  Clear to auscultation.  HEART:  Regular rate, S1-S2, and somewhat tachycardia.  ABDOMEN:  Good bowel sounds and is nontender.  EXTREMITIES:  He has no clubbing, cyanosis or edema.  NEUROLOGIC:  Nonfocal.   EKG reveals ST-segment elevation in the inferolateral leads.   He is now admitted with an acute coronary syndrome.  His pain has actually  been going on for at least 24 hours and could be as much as 4 days.  We have  discussed the risks, benefits and options with the patient.  He understands  and agrees to proceed.  We will proceed with urgent catheterization.           ______________________________  Vesta Mixer, M.D.     PJN/MEDQ  D:  02/23/2006  T:  02/23/2006  Job:  [id number]   cc:   Oley Balm. Georgina Pillion, M.D.  Fax: [phone]

## 2011-01-01 NOTE — Consult Note (Signed)
Logan Burton, HORSEY NO.:  [id number]   MEDICAL RECORD NO.:  [id number]          PATIENT TYPE:  INP   LOCATION:  2910                         FACILITY:  [facility]   PHYSICIAN:  Evelene Croon, M.D.     DATE OF BIRTH:  [birth year]   DATE OF CONSULTATION:  02/23/2006  DATE OF DISCHARGE:                                   CONSULTATION   REFERRING PHYSICIAN:  Dr. Kristeen Miss.   REASON FOR CONSULTATION:  Severe three-vessel coronary artery disease status  post acute inferior posterior lateral myocardial infarction.   CLINICAL HISTORY:  I was asked to evaluate this patient for consideration of  coronary bypass surgery by Dr. Elease Hashimoto. He is a 55 year old gentleman with a  history of hypertension and two pack per day smoking since a teenager who  developed severe substernal chest pain on Sunday morning.  He described this  as mid substernal radiating into his left shoulder and down his left arm.  It was associated with some nausea and diaphoresis as well as numbness in  his left arm.  This pain was present most of the day on Sunday and overnight  on Sunday night with a waxing and waning quality.  It resolved completely  early Monday morning.  He said that he had no pain on Monday but then  developed pain yesterday while at work in the morning.  He continued to have  pain all day yesterday which again was severe associated with nausea,  diaphoresis, shortness of breath, and weakness.  He continued to work all  day and went home and continued to have pain all night last night and about  3 o'clock in the morning said that he was still having pain and could not  sleep all night due to the severity of it and decided to seek medical  attention.  He was taken by family to [facility] were he  was given four sublingual nitroglycerin without resolution of his pain and  he did develop hypotension.  He was transferred to [facility] emergency room  and was diagnosed with  an acute MI.  His initial EKG showed changes  consistent with an acute inferior posterior lateral MI.  Initial CPK was  2999 with a MB of 277 and a troponin of 0.45.  He was taken to the cath lab  acutely and this showed the culprit to be a 100% OM2 occlusion.  The left  circumflex was a large dominant vessel.  The second marginal branch is  filled very faintly by collaterals but I could not tell how large the vessel  was.  Given the size of the left circumflex and a small first marginal  branch and small posterior descending branch, I suspect the OM2 was a  vessel.  There was also about 70-80% distal left circumflex stenosis before  a small posterior descending branch.  The right coronary artery was a  nondominant vessel with 60-70% proximal stenosis.  The LAD was a large  vessel that wrapped around the apex.  It had 50% proximal diffuse stenosis  and  then a 90% complex stenosis after that.  Ejection fraction was about 30%  with mid inferior and posterior akinesis and lateral severe hypokinesis.  I  Dr. Elease Hashimoto attempted to open the second obtuse marginal branch but could not  get a wire to pass through the vessel.  Given the length of time he had been  having chest pain and the level of CPK elevation and troponin elevation, it  was felt that he most likely had a completed infarct and attempts to open  the vessel were aborted.  The patient was taken to the CCU in stable  condition having 2/10 chest pain.  At the present time, he said that he is  having mild anterior chest wall discomfort which is mainly associated with  taking deep breaths.  He denies any pain in his left shoulder or left arm at  this time.   REVIEW OF SYSTEMS:  GENERAL:  He denies any fever or chills.  He denies any  recent weight changes.  He denies fatigue.  EYES:  Negative.  ENT:  Negative.  Endocrine he denies diabetes and hypothyroidism.  CARDIOVASCULAR:  He denies any chest pain prior to the initial event on Sunday.   He denies  PND and orthopnea.  He denies peripheral edema.  He has had no palpitations.  He denies exertional shortness of breath.  RESPIRATORY:  He denies cough and  sputum production.  GI:  He denies nausea or vomiting prior to this episode  on Sunday.  He denies melena, bright red blood per rectum.  GU:  He denies  dysuria and hematuria.  MUSCULOSKELETAL:  He does have history of  degenerative disk disease status post surgery on his cervical spine and  bulging disks in his lower back.  PSYCHIATRIC:  Negative. VASCULAR:  He  denies claudication and phlebitis.   PAST MEDICAL HISTORY:  Significant for hypertension.  He has a questionable  history of hyperlipidemia. He is status post surgery on his neck for disk  disease.  He does have lower spinal disk bulging.   FAMILY HISTORY:  Negative for cardiac disease.   SOCIAL HISTORY:  He works at [employer]. He smokes  two packs per day and has since he was a teenager.  He denies alcohol abuse  and drug use.   PHYSICAL EXAMINATION:  VITAL SIGNS:  His blood pressure is 111/69, his pulse  is 90 and regular.  Respiratory rate is 14 and nonlabored. Oxygen saturation  on room air on a face mask is 100%.  GENERAL:  He is a well-developed [place] gentleman in no apparent  distress.  HEENT:  Exam shows him to be normocephalic and atraumatic.  Pupils are equal  and reactive to light accommodation.  Extraocular muscles are intact.  Throat is clear.  NECK:  Exam shows normal carotid pulses bilaterally.  There are no bruits.  There is no adenopathy or thyromegaly.  CARDIAC:  Exam shows a regular rate and rhythm with a normal S1 and S2.  There is no murmur, rub or gallop.  LUNGS:  Clear.  Abdominal exam shows active bowel sounds.  ABDOMEN:  Soft,  flat, nontender.  There are no palpable masses or organomegaly. EXTREMITY:  Exam shows no peripheral edema.  Pedal pulses are palpable  bilaterally.  SKIN:  Warm and dry.   NEUROLOGIC:  Exam shows him to be alert and oriented x3.  Motor and sensory  exams are grossly normal.   IMPRESSION:  Mr. Mecham has severe  three-vessel coronary artery disease status  post acute inferior posterolateral myocardial infarction beginning on Sunday  of this week.  He has continued to have mild chest pain since admission but  this is related to deep breathing and maybe pericarditis from his  infarction.  I suspect he had a fairly large infarction given the rise in  his enzymes.  He did present with asymmetric pulmonary edema on his chest x-  ray as well as shortness of breath and decreased O2 saturations and is a two  pack per day smoker.  I think that coronary artery bypass graft surgery  would probably be the best long-term treatment for this patient since he  would require a fairly long area of stenting in the LAD.  His culprit vessel  is completely occluded and he has remained stable.  I think surgery should  wait at least 5 days, so that his heart has time to recover from this  infarction and his lungs have time to resolve the pulmonary edema.  He will  have a much smoother perioperative course if his lungs are completely clear  prior to performing surgery.  I did discuss the operative procedure with he  and his family including alternatives, benefits, and risks including  bleeding, blood transfusion, infection, stroke, myocardial infarction, graft  failure, and death.  I also discussed the importance of maximum cardiac risk  factor reduction including complete  smoking cessation.  He would like to think about this and discuss it further  with Dr. Elease Hashimoto before making a decision about surgery.  I will continue to  follow the patient closely to aid in further decision making about  treatment.      Evelene Croon, M.D.  Electronically Signed     BB/MEDQ  D:  02/23/2006  T:  02/23/2006  Job:  [id number]   cc:   Vesta Mixer, M.D.  Fax: [phone]

## 2011-01-19 ENCOUNTER — Other Ambulatory Visit: Payer: Self-pay | Admitting: *Deleted

## 2011-01-19 MED ORDER — METOPROLOL SUCCINATE ER 200 MG PO TB24: 200.0000 mg | ORAL_TABLET | Freq: Every day | ORAL | Status: DC

## 2011-01-19 NOTE — Telephone Encounter (Signed)
Strength verified with pt, med reordered.Alfonso Ramus RN

## 2011-02-04 ENCOUNTER — Encounter: Payer: Self-pay | Admitting: Cardiovascular Disease

## 2011-03-15 ENCOUNTER — Other Ambulatory Visit: Payer: Self-pay | Admitting: *Deleted

## 2011-03-15 MED ORDER — FENOFIBRATE 145 MG PO TABS: 145.0000 mg | ORAL_TABLET | Freq: Every day | ORAL | Status: DC

## 2011-03-15 NOTE — Telephone Encounter (Signed)
escribe medication per fax request  

## 2011-03-16 ENCOUNTER — Other Ambulatory Visit: Payer: Self-pay | Admitting: Cardiovascular Disease

## 2011-03-16 MED ORDER — FENOFIBRATE 145 MG PO TABS: 145.0000 mg | ORAL_TABLET | Freq: Every day | ORAL | Status: DC

## 2011-03-16 NOTE — Telephone Encounter (Signed)
Pt needs refill for T[facility] in [place], pt states needs 3 month supplies, he is out of pills now, pt states changing from [facility] [place]

## 2011-03-16 NOTE — Telephone Encounter (Signed)
Called requesting refill on Tricor be sent to [facility] [place] . Will send.

## 2011-03-17 ENCOUNTER — Other Ambulatory Visit: Payer: Self-pay | Admitting: *Deleted

## 2011-03-17 MED ORDER — FENOFIBRATE 145 MG PO TABS: 145.0000 mg | ORAL_TABLET | Freq: Every day | ORAL | Status: DC

## 2011-03-17 NOTE — Telephone Encounter (Signed)
Fax received from pharmacy. Refill completed. Jodette Everlyn Farabaugh RN  

## 2011-07-16 ENCOUNTER — Other Ambulatory Visit: Payer: Self-pay | Admitting: Cardiology

## 2011-07-16 DIAGNOSIS — N189 Chronic kidney disease, unspecified: Secondary | ICD-10-CM

## 2011-07-23 ENCOUNTER — Encounter (INDEPENDENT_AMBULATORY_CARE_PROVIDER_SITE_OTHER): Payer: BC Managed Care – PPO | Admitting: Cardiology

## 2011-07-23 DIAGNOSIS — I714 Abdominal aortic aneurysm, without rupture: Secondary | ICD-10-CM

## 2011-07-23 DIAGNOSIS — N189 Chronic kidney disease, unspecified: Secondary | ICD-10-CM

## 2011-07-23 DIAGNOSIS — I701 Atherosclerosis of renal artery: Secondary | ICD-10-CM

## 2011-08-02 ENCOUNTER — Other Ambulatory Visit: Payer: Self-pay | Admitting: Nephrology

## 2011-08-02 DIAGNOSIS — I701 Atherosclerosis of renal artery: Secondary | ICD-10-CM

## 2011-08-18 ENCOUNTER — Other Ambulatory Visit: Payer: Self-pay | Admitting: *Deleted

## 2011-08-18 MED ORDER — ROSUVASTATIN CALCIUM 20 MG PO TABS: 20.0000 mg | ORAL_TABLET | Freq: Every day | ORAL | Status: DC

## 2011-08-18 NOTE — Telephone Encounter (Signed)
Pt needs appointment then refill can be made, Fax Received. Refill Completed. Logan Burton (R.M.A)    

## 2011-08-19 ENCOUNTER — Other Ambulatory Visit: Payer: Self-pay | Admitting: *Deleted

## 2011-08-19 ENCOUNTER — Ambulatory Visit
Admission: RE | Admit: 2011-08-19 | Discharge: 2011-08-19 | Disposition: A | Discharge status: Home / Self Care | Payer: BC Managed Care – PPO | Source: Ambulatory Visit | Attending: Nephrology | Admitting: Nephrology

## 2011-08-19 DIAGNOSIS — I701 Atherosclerosis of renal artery: Secondary | ICD-10-CM

## 2011-08-19 NOTE — Telephone Encounter (Signed)
Fax Received. Refill Completed. Logan Burton (R.M.A)   

## 2011-08-20 ENCOUNTER — Other Ambulatory Visit: Payer: Self-pay | Admitting: Nephrology

## 2011-08-23 ENCOUNTER — Other Ambulatory Visit (HOSPITAL_COMMUNITY): Payer: Self-pay | Admitting: Nephrology

## 2011-08-23 DIAGNOSIS — I701 Atherosclerosis of renal artery: Secondary | ICD-10-CM

## 2011-08-25 ENCOUNTER — Other Ambulatory Visit: Payer: Self-pay | Admitting: Radiology

## 2011-08-26 ENCOUNTER — Encounter (HOSPITAL_COMMUNITY): Payer: Self-pay | Admitting: General Practice

## 2011-08-26 ENCOUNTER — Other Ambulatory Visit: Payer: Self-pay | Admitting: Radiology

## 2011-08-26 ENCOUNTER — Observation Stay (HOSPITAL_COMMUNITY)
Admission: AD | Admit: 2011-08-26 | Discharge: 2011-08-27 | Disposition: A | Discharge status: Home / Self Care | Payer: BC Managed Care – PPO | Source: Ambulatory Visit | Attending: Interventional Radiology | Admitting: Interventional Radiology

## 2011-08-26 DIAGNOSIS — I2510 Atherosclerotic heart disease of native coronary artery without angina pectoris: Secondary | ICD-10-CM | POA: Insufficient documentation

## 2011-08-26 DIAGNOSIS — I701 Atherosclerosis of renal artery: Principal | ICD-10-CM | POA: Insufficient documentation

## 2011-08-26 DIAGNOSIS — Z951 Presence of aortocoronary bypass graft: Secondary | ICD-10-CM | POA: Insufficient documentation

## 2011-08-26 DIAGNOSIS — I509 Heart failure, unspecified: Secondary | ICD-10-CM | POA: Insufficient documentation

## 2011-08-26 DIAGNOSIS — E7800 Pure hypercholesterolemia, unspecified: Secondary | ICD-10-CM | POA: Insufficient documentation

## 2011-08-26 DIAGNOSIS — I252 Old myocardial infarction: Secondary | ICD-10-CM | POA: Insufficient documentation

## 2011-08-26 HISTORY — DX: Acute myocardial infarction, unspecified: I21.9

## 2011-08-26 LAB — MRSA PCR SCREENING: MRSA by PCR: NEGATIVE

## 2011-08-26 MED ORDER — SODIUM CHLORIDE 0.9 % IV SOLN
INTRAVENOUS | Status: DC
  Administered 2011-08-26 – 2011-08-27 (×2): via INTRAVENOUS
  Administered 2011-08-27: 125 mL/h via INTRAVENOUS

## 2011-08-27 ENCOUNTER — Inpatient Hospital Stay (HOSPITAL_COMMUNITY): Payer: BC Managed Care – PPO

## 2011-08-27 LAB — BASIC METABOLIC PANEL
BUN: 20 mg/dL (ref 6–23)
CO2: 22 mEq/L (ref 19–32)
Calcium: 9.2 mg/dL (ref 8.4–10.5)
Chloride: 109 mEq/L (ref 96–112)
Creatinine, Ser: 1.84 mg/dL — ABNORMAL HIGH (ref 0.50–1.35)

## 2011-08-27 LAB — CBC
HCT: 40.6 % (ref 39.0–52.0)
MCH: 29.7 pg (ref 26.0–34.0)
MCHC: 34 g/dL (ref 30.0–36.0)
MCV: 87.5 fL (ref 78.0–100.0)
Platelets: 170 10*3/uL (ref 150–400)
RDW: 13.5 % (ref 11.5–15.5)
WBC: 6.1 10*3/uL (ref 4.0–10.5)

## 2011-08-27 LAB — APTT: aPTT: 28 seconds (ref 24–37)

## 2011-08-27 MED ORDER — IODIXANOL 320 MG/ML IV SOLN
100.0000 mL | Freq: Once | INTRAVENOUS | Status: AC | PRN
  Administered 2011-08-27: 15 mL via INTRAVENOUS

## 2011-08-27 MED ORDER — CEFAZOLIN SODIUM 1-5 GM-% IV SOLN
INTRAVENOUS | Status: AC
  Administered 2011-08-27: 10:00:00
  Filled 2011-08-27: qty 50

## 2011-08-27 MED ORDER — HEPARIN SODIUM (PORCINE) 1000 UNIT/ML IJ SOLN
INTRAMUSCULAR | Status: AC
  Administered 2011-08-27: 5000 [IU] via INTRAVENOUS
  Filled 2011-08-27: qty 1

## 2011-08-27 MED ORDER — MIDAZOLAM HCL 5 MG/5ML IJ SOLN
INTRAMUSCULAR | Status: AC | PRN
  Administered 2011-08-27: 2 mg via INTRAVENOUS
  Administered 2011-08-27 (×2): 1 mg via INTRAVENOUS
  Administered 2011-08-27: 2 mg via INTRAVENOUS

## 2011-08-27 MED ORDER — FENTANYL CITRATE 0.05 MG/ML IJ SOLN
INTRAMUSCULAR | Status: AC | PRN
  Administered 2011-08-27 (×2): 50 ug via INTRAVENOUS
  Administered 2011-08-27 (×2): 100 ug via INTRAVENOUS

## 2011-08-27 NOTE — Discharge Summary (Signed)
Physician Discharge Summary  Patient ID: Logan Burton MRN: [id number] DOB/AGE: [birth year] 56 y.o.  Admit date: 08/26/2011 Discharge date: 08/27/2011  Admission Diagnoses: Left renal artery stenosis  Discharge Diagnoses: Left renal artery stenosis status post stent placement Secondary diagnoses: CHF, myocardial infarction, coronary artery disease, hypertension, hypercholesterolemia, renal insufficiency, peripheral vascular disease  Discharged Condition: good  Hospital Course: Logan Burton is a 56 year old male patient of Dr. Kathe Mariner who was referred to the interventional radiology service for further evaluation and treatment of a left renal artery stenosis in the setting of poorly controlled hypertension and renal insufficiency. Prior renal Doppler study demonstrated a very small right kidney and a 13 cm left kidney with a peak systolic velocity of greater than 300 cm/s at its origin. The patient has had a recent history of worsening renal insufficiency. Patient was admitted to [facility] on 08/26/2011 for intravenous hydration and Mucomyst prior to planned renal arteriogram and left renal artery stenting. On 08/27/2011 the patient underwent renal arteriogram with left renal artery stenting via IV consciuos sedation by Dr. Lenoria Farrier Hoss without complication. The patient tolerated the procedure well and was observed in the hospital for 6 hours post procedure. The patient was able to void without difficulty and tolerated his diet well. Patient had no significant complaints prior to discharge.  Consults: none  Significant Diagnostic Studies: Renal arteriogram with left renal artery stenting.  Treatments: Left renal artery stenting utilizing 15 cc's of contrast and CO2  Discharge Exam: Blood pressure 126/79, pulse 99, temperature 97.7 F (36.5 C), temperature source Oral, resp. rate 14, SpO2 99.00%. The patient is awake, alert and oriented. Chest is clear to auscultation bilaterally. Heart regular  rate and rhythm. Abdomen is soft, nontender to palpation, positive bowel sounds.Right femoral artery puncture site clean, dry and nontender. Results for orders placed during the hospital encounter of 08/26/11  APTT      Component Value Range   aPTT 28  24 - 37 (seconds)  BASIC METABOLIC PANEL      Component Value Range   Sodium 140  135 - 145 (mEq/L)   Potassium 4.2  3.5 - 5.1 (mEq/L)   Chloride 109  96 - 112 (mEq/L)   CO2 22  19 - 32 (mEq/L)   Glucose, Bld 132 (*) 70 - 99 (mg/dL)   BUN 20  6 - 23 (mg/dL)   Creatinine, Ser 4.09 (*) 0.50 - 1.35 (mg/dL)   Calcium 9.2  8.4 - 81.1 (mg/dL)   GFR calc non Af Amer 39 (*) >90 (mL/min)   GFR calc Af Amer 45 (*) >90 (mL/min)  CBC      Component Value Range   WBC 6.1  4.0 - 10.5 (K/uL)   RBC 4.64  4.22 - 5.81 (MIL/uL)   Hemoglobin 13.8  13.0 - 17.0 (g/dL)   HCT 91.4  78.2 - 95.6 (%)   MCV 87.5  78.0 - 100.0 (fL)   MCH 29.7  26.0 - 34.0 (pg)   MCHC 34.0  30.0 - 36.0 (g/dL)   RDW 21.3  08.6 - 57.8 (%)   Platelets 170  150 - 400 (K/uL)  PROTIME-INR      Component Value Range   Prothrombin Time 14.6  11.6 - 15.2 (seconds)   INR 1.12  0.00 - 1.49   MRSA PCR SCREENING      Component Value Range   MRSA by PCR NEGATIVE  NEGATIVE     Disposition:  discharged to home  Discharge Orders  Future Orders Please Complete By Expires   Discharge patient        Medication List  As of 08/27/2011  4:20 PM   TAKE these medications         amLODipine 5 MG tablet   Commonly known as: NORVASC   Take 5 mg by mouth every evening.      aspirin 325 MG tablet   Take 325 mg by mouth daily.      fenofibrate 145 MG tablet   Commonly known as: TRICOR   Take 1 tablet (145 mg total) by mouth daily.      metoprolol 200 MG 24 hr tablet   Commonly known as: TOPROL-XL   Take 1 tablet (200 mg total) by mouth daily.      RANEXA 1000 MG SR tablet   Generic drug: ranolazine   TAKE 1 TABLET BY MOUTH TWICE A DAY      rosuvastatin 20 MG tablet   Commonly  known as: CRESTOR   Take 1 tablet (20 mg total) by mouth daily.           Follow-up Information    Follow up with PATEL,JAY K., MD. ( continue follow up as scheduled)        Patient will be scheduled for follow up renal Doppler study in approximately 6 months with interventional radiology clinic visit afterwards with Dr. Bonnielee Haff. He will resume preprocedure diet. Post arteriogram instructions were reviewed with patient. He will contact our service at [phone] with any questions.  Signed: Chinita Pester 08/27/2011, 4:20 PM

## 2011-08-27 NOTE — ED Notes (Signed)
Patient denies pain and is resting comfortably.  

## 2011-08-27 NOTE — Procedures (Signed)
L RA stent 8 by 24 mm  No comp

## 2011-08-27 NOTE — ED Notes (Signed)
Renal artery pressure 100/74 (87) Aorta pressure  130/83   (101)

## 2011-08-27 NOTE — H&P (Signed)
[name] is an 56 y.o. male.   Chief Complaint: Poorly controlled HTN despite several antihypertensives.  Patient with presumed renal artery stenosis- presents for C02 renal angiogram with possible angioplasty and or stent.  HPI:   RADIOLOGIST EVALUATION AND MANAGEMENT - OUTPATIENT OFFICE CONSULT - LEVEL 3:   August 19, 2011   Zetta Bills, M.D. [facility] [address] [place], [place]  [address]   RE:  Logan Burton (Oct 23, 1955)   Dear Dr. Allena Katz:   Thank you for your referral of Logan Burton regarding renal insufficiency and renal artery stenosis.  He is a 57 year old male with a recent history of worsening renal insufficiency.  He also has a history of hypertension and is on amlodipine as well as metoprolol.    He was on lisinopril but this was removed secondary to an elevated creatinine level.  A subsequent renal Doppler demonstrates a very small right kidney and a 13 cm left kidney with a peak systolic velocity of greater than 300 cm per second at its origin.  He is referred for carbon dioxide renal artery stenting. He also has a history of coronary artery bypass and peripheral vascular disease but no history of stroke.  His blood pressure is relatively controlled according to the patient.   Past Medical History:  CHF, myocardial infarction, coronary artery disease, hypertension, hypercholesterolemia, renal insufficiency.   Past Surgical History:  Cervical fusion, coronary artery bypass, hernia repair.   Medications:  Crestor, metoprolol, Ranexa, TriCor, amlodipine, aspirin 325.   Allergies:  None.   Social History:  Negative tobacco and negative alcohol.   Review of Systems:  No fevers, chills, nausea, vomiting, diaphoresis.  No recent chest pain or dyspnea.   Physical Exam:  Blood pressure 120/80, pulse 80, respirations 16, temperature 98, pulse ox 97 percent on room air.  Heart:  Regular rate and rhythm without murmur.  Lungs are clear to  auscultation bilaterally.  Abdomen:  Soft and nontender.  Extremities:  No cyanosis, edema or clubbing.  Femoral pulses 2+ bilaterally. Dorsalis pedis and posterior tibial pulses are 1+ bilaterally. Neurological exam is nonfocal.   As described above, an ultrasound report demonstrates a markedly elevated velocity at the origin of the left renal artery worrisome for renal artery stenosis.   Labs:  Most recent creatinine performed 07/02/11 resulted in a creatinine of 1.87 and BUN of 19.  Hemoglobin was 14.4 and platelet count was 197,000.   In summary, Mr. Logan Burton does have what I believe is significant renal artery stenosis and because it is his primary kidney, he does have renal insufficiency.  His creatinine  clearance level is probably just above 30 with a creatinine less than 2.  Recommendations are for carbon dioxide renal artery stenting.  I will plan on admitting him the night before for hydration and Mucomyst with subsequent renal artery stenting and further hydration.  The procedure may require the usage of 10-20 cc iodinated contrast.   Thank you for the referral of this pleasant patient.   Sincerely,   Jolaine Click, M.D.   Lear Ng   08/27/2011 exam :  Past Medical History  Diagnosis Date  . Coronary artery disease   . 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 History of heart attack   . CHF (congestive heart failure)   . Chronic renal insufficiency   . PVD (peripheral vascular disease)   . Dyslipidemia   . Myocardial infarction     Past Surgical History  Procedure Date  .  Coronary artery bypass graft   . Cervical fusion   . Hernia repair   . Angioplasty    No complications with previous anesthesia per patient.   History reviewed. No pertinent family history. Social History:  reports that he quit smoking about 5 years ago. His smoking use included Cigarettes. He has a 60 pack-year smoking history. He has never used smokeless tobacco. He reports  that he drinks alcohol. He reports that he does not use illicit drugs.  Allergies: No Known Allergies  Medications Prior to Admission  Medication Dose Route Frequency Provider Last Rate Last Dose  . 0.9 %  sodium chloride infusion   Intravenous Continuous Robet Leu, [place] 125 mL/hr at 08/27/11 0848 125 mL/hr at 08/27/11 0848   Medications Prior to Admission  Medication Sig Dispense Refill  . amLODipine (NORVASC) 5 MG tablet Take 5 mg by mouth every evening.      Marland Kitchen aspirin 325 MG tablet Take 325 mg by mouth daily.        . fenofibrate (TRICOR) 145 MG tablet Take 1 tablet (145 mg total) by mouth daily.  90 tablet  3  . metoprolol (TOPROL-XL) 200 MG 24 hr tablet Take 1 tablet (200 mg total) by mouth daily.  90 tablet  1  . RANEXA 1000 MG SR tablet TAKE 1 TABLET BY MOUTH TWICE A DAY  180 tablet  3  . rosuvastatin (CRESTOR) 20 MG tablet Take 1 tablet (20 mg total) by mouth daily.  30 tablet  0    Results for orders placed during the hospital encounter of 08/26/11 (from the past 48 hour(s))  MRSA PCR SCREENING     Status: Normal   Collection Time   08/26/11  4:00 PM      Component Value Range Comment   MRSA by PCR NEGATIVE  NEGATIVE    APTT     Status: Normal   Collection Time   08/27/11  6:03 AM      Component Value Range Comment   aPTT 28  24 - 37 (seconds)   BASIC METABOLIC PANEL     Status: Abnormal   Collection Time   08/27/11  6:03 AM      Component Value Range Comment   Sodium 140  135 - 145 (mEq/L)    Potassium 4.2  3.5 - 5.1 (mEq/L)    Chloride 109  96 - 112 (mEq/L)    CO2 22  19 - 32 (mEq/L)    Glucose, Bld 132 (*) 70 - 99 (mg/dL)    BUN 20  6 - 23 (mg/dL)    Creatinine, Ser 1.61 (*) 0.50 - 1.35 (mg/dL)    Calcium 9.2  8.4 - 10.5 (mg/dL)    GFR calc non Af Amer 39 (*) >90 (mL/min)    GFR calc Af Amer 45 (*) >90 (mL/min)   CBC     Status: Normal   Collection Time   08/27/11  6:03 AM      Component Value Range Comment   WBC 6.1  4.0 - 10.5 (K/uL)    RBC 4.64  4.22 -  5.81 (MIL/uL)    Hemoglobin 13.8  13.0 - 17.0 (g/dL)    HCT 09.6  04.5 - 40.9 (%)    MCV 87.5  78.0 - 100.0 (fL)    MCH 29.7  26.0 - 34.0 (pg)    MCHC 34.0  30.0 - 36.0 (g/dL)    RDW 81.1  91.4 - 78.2 (%)    Platelets 170  150 - 400 (K/uL)   PROTIME-INR     Status: Normal   Collection Time   08/27/11  6:03 AM      Component Value Range Comment   Prothrombin Time 14.6  11.6 - 15.2 (seconds)    INR 1.12  0.00 - 1.49     No results found.  Review of Systems  Constitutional: Negative for fever and chills.  HENT: Negative.   Eyes: Negative.   Respiratory: Positive for shortness of breath.   Cardiovascular: Positive for orthopnea. Negative for chest pain and leg swelling.  Gastrointestinal: Negative.   Genitourinary: Negative.   Musculoskeletal: Negative.   Skin: Negative.   Neurological: Negative.  Negative for weakness.  Endo/Heme/Allergies: Negative.   Psychiatric/Behavioral: Negative.     Blood pressure 119/79, pulse 80, temperature 97.6 F (36.4 C), temperature source Oral, resp. rate 18, SpO2 97.00%. Physical Exam  Constitutional: He is oriented to person, place, and time. He appears well-developed and well-nourished.  HENT:  Head: Normocephalic and atraumatic.  Neck: Normal range of motion.  Cardiovascular: Normal rate and regular rhythm.  Exam reveals no gallop and no friction rub.   No murmur heard. Respiratory: Effort normal and breath sounds normal. He has no wheezes. He has no rales.  GI: Soft. Bowel sounds are normal. He exhibits no distension.  Musculoskeletal: Normal range of motion. He exhibits no edema.  Neurological: He is alert and oriented to person, place, and time.  Skin: Skin is warm and dry.  Psychiatric: He has a normal mood and affect. His behavior is normal. Judgment and thought content normal.     Assessment/Plan Patient was met in consultation in regards to procedure.  He was admitted overnight for hydration.  Procedure details reviewed with  patient.  He verbalized the understanding of potential complications including but not limited to infection, bleeding, vessel damage, worsening renal functioning, complications with moderate sedation and inability to be successful with procedure.  Written consent obtained. Physical exam and lab values are appropriate to proceed this morning.   Jahayra Mazo D 08/27/2011, 8:52 AM

## 2011-08-30 ENCOUNTER — Other Ambulatory Visit: Payer: Self-pay | Admitting: Interventional Radiology

## 2011-08-30 DIAGNOSIS — N289 Disorder of kidney and ureter, unspecified: Secondary | ICD-10-CM

## 2011-08-30 DIAGNOSIS — I701 Atherosclerosis of renal artery: Secondary | ICD-10-CM

## 2011-10-27 ENCOUNTER — Other Ambulatory Visit: Payer: Self-pay | Admitting: Cardiovascular Disease

## 2011-10-27 MED ORDER — METOPROLOL SUCCINATE ER 200 MG PO TB24: 200.0000 mg | ORAL_TABLET | Freq: Every day | ORAL | Status: DC

## 2011-10-27 NOTE — Telephone Encounter (Signed)
Fax Received. Refill Completed. Logan Burton (R.M.A). Pt will get more refills after Appointment.

## 2011-10-27 NOTE — Telephone Encounter (Signed)
Out of pills 

## 2011-11-13 ENCOUNTER — Encounter: Payer: Self-pay | Admitting: Cardiovascular Disease

## 2011-12-03 ENCOUNTER — Ambulatory Visit (INDEPENDENT_AMBULATORY_CARE_PROVIDER_SITE_OTHER): Payer: BC Managed Care – PPO | Admitting: Cardiovascular Disease

## 2011-12-03 ENCOUNTER — Encounter: Payer: Self-pay | Admitting: Cardiovascular Disease

## 2011-12-03 VITALS — BP 146/80 | HR 105 | Resp 18 | Ht 73.0 in | Wt 191.0 lb

## 2011-12-03 DIAGNOSIS — I509 Heart failure, unspecified: Secondary | ICD-10-CM

## 2011-12-03 DIAGNOSIS — I10 Essential (primary) hypertension: Secondary | ICD-10-CM

## 2011-12-03 DIAGNOSIS — I2510 Atherosclerotic heart disease of native coronary artery without angina pectoris: Secondary | ICD-10-CM

## 2011-12-03 DIAGNOSIS — E785 Hyperlipidemia, unspecified: Secondary | ICD-10-CM

## 2011-12-03 LAB — BASIC METABOLIC PANEL
BUN: 18 mg/dL (ref 6–23)
CO2: 25 mEq/L (ref 19–32)
Calcium: 9.7 mg/dL (ref 8.4–10.5)
GFR: 43.07 mL/min — ABNORMAL LOW (ref >60.00)
Glucose, Bld: 86 mg/dL (ref 70–99)

## 2011-12-03 LAB — HEPATIC FUNCTION PANEL
ALT: 33 U/L (ref 0–53)
AST: 25 U/L (ref 0–37)
Bilirubin, Direct: 0.1 mg/dL (ref 0.0–0.3)
Total Bilirubin: 0.5 mg/dL (ref 0.3–1.2)

## 2011-12-03 LAB — LIPID PANEL: Total CHOL/HDL Ratio: 3

## 2011-12-03 MED ORDER — METOPROLOL SUCCINATE ER 200 MG PO TB24: 200.0000 mg | ORAL_TABLET | Freq: Every day | ORAL | Status: DC

## 2011-12-03 MED ORDER — FENOFIBRATE 145 MG PO TABS: 145.0000 mg | ORAL_TABLET | Freq: Every day | ORAL | Status: DC

## 2011-12-03 NOTE — Assessment & Plan Note (Signed)
He's not having any episodes of chest pain. We'll continue with same medications. He was unable to afford Ranexa but fortunately he's not having any angina.

## 2011-12-03 NOTE — Assessment & Plan Note (Signed)
He has a history of congestive heart failure with an ejection fraction of around 45%. We will anticipate repeating an echocardiogram later this year. I would like to restart all of his medications prior to getting a  repeat echo.  He  is relatively asymptomatic at this time.

## 2011-12-03 NOTE — Patient Instructions (Signed)
Your physician recommends that you schedule a follow-up appointment in: 3 MONTHS   Your physician has recommended you make the following change in your medication:   STOP RANEXA DUE TO COST RESTART METOPROLOL XL 200 MG BY TAKING 1/4 OF A TABLET X 6 DAYS THEN INCREASE TO 1/2 TABLET FOR 6 DAYS THEN GO TO FULL 200 MG DOSE DAILY.   Your physician recommends that you return for a FASTING lipid profile: TODAY  Your physician recommends that you return for lab work in: 3 MONTHS/ BMET

## 2011-12-03 NOTE — Progress Notes (Signed)
Logan Burton Date of Birth  1956/06/26 [facility]     [place] Office  [address]    [address]   [address] [place], [place]  [address]    [place], [place]  [address] [phone]  Fax  [phone]  [phone]  Fax [phone]  Status post CABG 2. 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 Congestive heart failure-EF of 45% 4. Hyperlipidemia  5. Constipation  History of Present Illness:  Logan Burton is doing fairly well. It's been about again absence of staining. We lost contact with him when we moved our office. He ran out of medicines recently and presents today for followup visit. He has not had metoprolol in a week or so.  He continues to have episodes of shortness of breath particularly with any sort of exertion. His shortness breath has not worsened recently. He denies any episodes of chest pain. He is able to do all of his normal activities without any significant problems.  He stopped taking the Ranexa on his own because it was too expensive.  Current Outpatient Prescriptions on File Prior to Visit  Medication Sig Dispense Refill  . amLODipine (NORVASC) 5 MG tablet Take 5 mg by mouth every evening.      Marland Kitchen aspirin 325 MG tablet Take 325 mg by mouth daily.        . metoprolol (TOPROL-XL) 200 MG 24 hr tablet Take 1 tablet (200 mg total) by mouth daily.  30 tablet  0  . rosuvastatin (CRESTOR) 20 MG tablet Take 1 tablet (20 mg total) by mouth daily.  30 tablet  0  . fenofibrate (TRICOR) 145 MG tablet Take 1 tablet (145 mg total) by mouth daily.  90 tablet  3  . RANEXA 1000 MG SR tablet TAKE 1 TABLET BY MOUTH TWICE A DAY  180 tablet  3    No Known Allergies  Past Medical History  Diagnosis Date  . Coronary artery disease   . 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 History of heart attack   . CHF (congestive heart failure)   . Chronic renal insufficiency   . PVD (peripheral vascular disease)   . Dyslipidemia   . Myocardial  infarction     Past Surgical History  Procedure Date  . Coronary artery bypass graft   . Cervical fusion   . Hernia repair   . Angioplasty     History  Smoking status  . Former Smoker -- 2.0 packs/day for 30 years  . Types: Cigarettes  . Quit date: 02/23/2006  Smokeless tobacco  . Never Used    History  Alcohol Use  . Yes    only rare social occasions    No family history on file.  Reviw of Systems:  Reviewed in the HPI.  All other systems are negative.  Physical Exam: Blood pressure 146/80, pulse 105, resp. rate 18, height 6\' 1"  (1.854 m), weight 191 lb (86.637 kg). General: Well developed, well nourished, in no acute distress.  Head: Normocephalic, atraumatic, sclera non-icteric, mucus membranes are moist,   Neck: Supple. Carotids are 2 + without bruits. No JVD  Lungs: Clear bilaterally to auscultation.  Heart: regular rate.  normal  S1 S2. No murmurs, gallops or rubs.  Abdomen: Soft, non-tender, non-distended with normal bowel sounds. No hepatomegaly. No rebound/guarding. No masses.  Msk:  Strength and tone are normal  Extremities: No clubbing or cyanosis. No edema.  Distal pedal pulses are 2+ and  equal bilaterally.  Neuro: Alert and oriented X 3. Moves all extremities spontaneously.  Psych:  Responds to questions appropriately with a normal affect.  ECG:  Assessment / Plan:

## 2011-12-03 NOTE — Assessment & Plan Note (Signed)
We'll check fasting labs today. 

## 2011-12-03 NOTE — Assessment & Plan Note (Signed)
Logan Burton is doing well. His blood pressures up little bit but this is likely to be due to affected he's not taking his metoprolol recently.  We will refill his prescription for Toprol-XL 200 mg a day. He's to start with one quarter of a tablet a day for the first 4-6 days. He then can increase it to a half a tablet a day for the next 6 days. He can then resume his typical 200 mg a day dosing.  I seen again in 3 months.

## 2011-12-07 ENCOUNTER — Telehealth: Payer: Self-pay | Admitting: Cardiovascular Disease

## 2011-12-07 NOTE — Telephone Encounter (Signed)
Fu call °Patient returning your call °

## 2011-12-07 NOTE — Telephone Encounter (Signed)
Given results of labs.

## 2012-01-26 ENCOUNTER — Encounter: Payer: Self-pay | Admitting: Cardiovascular Disease

## 2012-03-03 ENCOUNTER — Encounter: Payer: Self-pay | Admitting: Cardiovascular Disease

## 2012-03-03 ENCOUNTER — Ambulatory Visit (INDEPENDENT_AMBULATORY_CARE_PROVIDER_SITE_OTHER): Payer: BC Managed Care – PPO | Admitting: Cardiovascular Disease

## 2012-03-03 VITALS — BP 119/71 | HR 76 | Ht 73.0 in | Wt 192.4 lb

## 2012-03-03 DIAGNOSIS — I509 Heart failure, unspecified: Secondary | ICD-10-CM

## 2012-03-03 DIAGNOSIS — I2510 Atherosclerotic heart disease of native coronary artery without angina pectoris: Secondary | ICD-10-CM

## 2012-03-03 DIAGNOSIS — E785 Hyperlipidemia, unspecified: Secondary | ICD-10-CM

## 2012-03-03 NOTE — Progress Notes (Addendum)
     Logan Burton Date of Birth  1956/02/17 [facility]     [place] Office  [address]    [address]   [address] [place], [place]  [address]    [place], [place]  [address] [phone]  Fax  [phone]  [phone]  Fax [phone]  Status post CABG 2. 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 Congestive heart failure-EF of 45% 4. Hyperlipidemia  5. Constipation  History of Present Illness:  Logan Burton is doing fairly well.   He continues to have episodes of shortness of breath particularly with any sort of exertion. His shortness breath has not worsened recently. He denies any episodes of chest pain. He is able to do all of his normal activities without any significant problems.  He still has significant dyspnea with heavy exertion.  He has been gradually getting stronger and stronger.   Current Outpatient Prescriptions on File Prior to Visit  Medication Sig Dispense Refill  . aspirin 325 MG tablet Take 325 mg by mouth daily.        Marland Kitchen co-enzyme Q-10 30 MG capsule Take 100 mg by mouth 2 (two) times daily.       . fenofibrate (TRICOR) 145 MG tablet Take 1 tablet (145 mg total) by mouth daily.  90 tablet  3  . metoprolol (TOPROL-XL) 200 MG 24 hr tablet Take 1 tablet (200 mg total) by mouth daily.  90 tablet  3  . rosuvastatin (CRESTOR) 20 MG tablet Take 1 tablet (20 mg total) by mouth daily.  30 tablet  0    No Known Allergies  Past Medical History  Diagnosis Date  . Coronary artery disease   . 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 History of heart attack   . CHF (congestive heart failure)   . Chronic renal insufficiency   . PVD (peripheral vascular disease)   . Dyslipidemia   . Myocardial infarction     Past Surgical History  Procedure Date  . Coronary artery bypass graft   . Cervical fusion   . Hernia repair   . Angioplasty     History  Smoking status  . Former Smoker -- 2.0 packs/day for 30 years  . Types: Cigarettes    . Quit date: 02/23/2006  Smokeless tobacco  . Never Used    History  Alcohol Use  . Yes    only rare social occasions    No family history on file.  Reviw of Systems:  Reviewed in the HPI.  All other systems are negative.  Physical Exam: Blood pressure 119/71, pulse 76, height 6\' 1"  (1.854 m), weight 192 lb 6.4 oz (87.272 kg). General: Well developed, well nourished, in no acute distress.  Head: Normocephalic, atraumatic, sclera non-icteric, mucus membranes are moist,   Neck: Supple. Carotids are 2 + without bruits. No JVD  Lungs: Clear bilaterally to auscultation.  Heart: regular rate.  normal  S1 S2. No murmurs, gallops or rubs.  Abdomen: Soft, non-tender, non-distended with normal bowel sounds. No hepatomegaly. No rebound/guarding. No masses.  Msk:  Strength and tone are normal  Extremities: No clubbing or cyanosis. No edema.  Distal pedal pulses are 2+ and equal bilaterally.  Neuro: Alert and oriented X 3. Moves all extremities spontaneously.  Psych:  Responds to questions appropriately with a normal affect.  ECG:  March 03, 2012 - NSR, LAE, RBBB. Inferior lateral MI - Old.   Assessment / Plan:

## 2012-03-03 NOTE — Assessment & Plan Note (Signed)
Harbor has significant CHF from ischemic heart disease.  He has gradually improved and is able to do most of his normal activities.  We will continue his current meds.  I will see in 6 months.

## 2012-03-03 NOTE — Patient Instructions (Addendum)
Your physician wants you to follow-up in: 6 MONTHS You will receive a reminder letter in the mail two months in advance. If you don't receive a letter, please call our office to schedule the follow-up appointment.  Your physician recommends that you return for a FASTING lipid profile: 6 MONTHS    

## 2012-03-08 ENCOUNTER — Other Ambulatory Visit: Payer: Self-pay | Admitting: Cardiology

## 2012-03-08 DIAGNOSIS — I701 Atherosclerosis of renal artery: Secondary | ICD-10-CM

## 2012-03-10 ENCOUNTER — Encounter (INDEPENDENT_AMBULATORY_CARE_PROVIDER_SITE_OTHER): Payer: BC Managed Care – PPO

## 2012-03-10 DIAGNOSIS — I701 Atherosclerosis of renal artery: Secondary | ICD-10-CM

## 2012-03-21 ENCOUNTER — Ambulatory Visit
Admission: RE | Admit: 2012-03-21 | Discharge: 2012-03-21 | Disposition: A | Discharge status: Home / Self Care | Payer: BC Managed Care – PPO | Source: Ambulatory Visit | Attending: Interventional Radiology | Admitting: Interventional Radiology

## 2012-03-21 DIAGNOSIS — I701 Atherosclerosis of renal artery: Secondary | ICD-10-CM

## 2012-03-21 DIAGNOSIS — N289 Disorder of kidney and ureter, unspecified: Secondary | ICD-10-CM

## 2012-04-03 NOTE — Addendum Note (Signed)
Addended by: Micki Riley C on: 04/03/2012 03:43 PM   Modules accepted: Orders

## 2012-06-06 ENCOUNTER — Other Ambulatory Visit: Payer: Self-pay | Admitting: *Deleted

## 2012-06-06 MED ORDER — METOPROLOL SUCCINATE ER 200 MG PO TB24: 200.0000 mg | ORAL_TABLET | Freq: Every day | ORAL | Status: DC

## 2012-09-05 ENCOUNTER — Other Ambulatory Visit: Payer: Self-pay | Admitting: Interventional Radiology

## 2012-09-05 DIAGNOSIS — I701 Atherosclerosis of renal artery: Secondary | ICD-10-CM

## 2012-09-18 ENCOUNTER — Other Ambulatory Visit: Payer: Self-pay | Admitting: Nephrology

## 2012-09-18 DIAGNOSIS — I701 Atherosclerosis of renal artery: Secondary | ICD-10-CM

## 2012-10-19 ENCOUNTER — Ambulatory Visit
Admission: RE | Admit: 2012-10-19 | Discharge: 2012-10-19 | Disposition: A | Discharge status: Home / Self Care | Payer: BC Managed Care – PPO | Source: Ambulatory Visit | Attending: Interventional Radiology | Admitting: Interventional Radiology

## 2012-10-19 ENCOUNTER — Ambulatory Visit
Admission: RE | Admit: 2012-10-19 | Discharge: 2012-10-19 | Disposition: A | Discharge status: Home / Self Care | Payer: BC Managed Care – PPO | Source: Ambulatory Visit | Attending: Nephrology | Admitting: Nephrology

## 2012-10-19 DIAGNOSIS — I701 Atherosclerosis of renal artery: Secondary | ICD-10-CM

## 2012-10-19 NOTE — Progress Notes (Signed)
No complaints, saw Dr Allena Katz 2 weeks ago and he did labs that were drawn in Jan. 2014 Next follow up with Dr Allena Katz will be in 4-6 months. BP's have been stable, however, creat. Level still high.

## 2012-10-30 ENCOUNTER — Telehealth: Payer: Self-pay | Admitting: Cardiovascular Disease

## 2012-10-30 DIAGNOSIS — E785 Hyperlipidemia, unspecified: Secondary | ICD-10-CM

## 2012-10-30 NOTE — Telephone Encounter (Signed)
New Problem:     Patient called in wanting to switch his pharmacy [facility] in [place] and wanted to know if he would be able to take a 90 day supply of the generic of rosuvastatin (CRESTOR) 20 MG tablet.  Please call back if you have any questions.

## 2012-10-30 NOTE — Telephone Encounter (Signed)
Pt wants a 90 day supply of a generic medication stating crestor is too expensive. Please review and advise.

## 2012-10-31 MED ORDER — ATORVASTATIN CALCIUM 80 MG PO TABS: 80.0000 mg | ORAL_TABLET | Freq: Every day | ORAL | Status: DC

## 2012-10-31 NOTE — Telephone Encounter (Signed)
     I'm going to try to start dictating the medication list when I typed your result notes. That will help [place] when we need to look back at lab results to try to remember which medication he was on that gave [place] the results .  We will discontinue the Crestor. Start atorvastatin 80 mg a day. Check fasting lipids, basic metabolic profile, and hepatic profile in 3 months.

## 2012-10-31 NOTE — Telephone Encounter (Signed)
Pt informed/ MAR adjusted, 3 month lab date set.

## 2012-10-31 NOTE — Addendum Note (Signed)
Addended by: Antony Odea on: 10/31/2012 04:43 PM   Modules accepted: Orders, Medications

## 2012-10-31 NOTE — Telephone Encounter (Signed)
I'm going to try to start dictating the medication list when I typed your result notes. That will help [place] when we need to look back at lab results to try to remember which medication he was on that gave [place] the results .  We will discontinue the Crestor. Start atorvastatin 80 mg a day. Check fasting lipids, basic metabolic profile, and hepatic profile in 3 months.

## 2012-12-20 ENCOUNTER — Other Ambulatory Visit: Payer: Self-pay | Admitting: *Deleted

## 2012-12-20 MED ORDER — FENOFIBRATE 145 MG PO TABS: 145.0000 mg | ORAL_TABLET | Freq: Every day | ORAL | Status: DC

## 2012-12-20 NOTE — Telephone Encounter (Signed)
Fax Received. Refill Completed. Harmani Neto Chowoe (R.M.A)   

## 2013-01-12 ENCOUNTER — Encounter: Payer: Self-pay | Admitting: Cardiovascular Disease

## 2013-01-12 ENCOUNTER — Ambulatory Visit (INDEPENDENT_AMBULATORY_CARE_PROVIDER_SITE_OTHER): Payer: BC Managed Care – PPO | Admitting: Cardiovascular Disease

## 2013-01-12 VITALS — BP 132/80 | HR 61 | Ht 73.0 in | Wt 197.0 lb

## 2013-01-12 DIAGNOSIS — I509 Heart failure, unspecified: Secondary | ICD-10-CM

## 2013-01-12 DIAGNOSIS — Z951 Presence of aortocoronary bypass graft: Secondary | ICD-10-CM

## 2013-01-12 DIAGNOSIS — I5022 Chronic systolic (congestive) heart failure: Secondary | ICD-10-CM

## 2013-01-12 DIAGNOSIS — R0989 Other specified symptoms and signs involving the circulatory and respiratory systems: Secondary | ICD-10-CM

## 2013-01-12 DIAGNOSIS — R0600 Dyspnea, unspecified: Secondary | ICD-10-CM

## 2013-01-12 NOTE — Progress Notes (Signed)
spo2 at rest= 95% Ambulation greater than 50 ft = 93% Post amb Rest spo2 = 98%

## 2013-01-12 NOTE — Patient Instructions (Addendum)
1) Your physician has requested that you have a lexiscan myoview.  Please follow instruction sheet, as given.  2) Your physician has requested that you have an echocardiogram. Echocardiography is a painless test that uses sound waves to create images of your heart. It provides your doctor with information about the size and shape of your heart and how well your heart's chambers and valves are working. This procedure takes approximately one hour. There are no restrictions for this procedure.  3) Your physician has recommended that you have a pulmonary function test. Pulmonary Function Tests are a group of tests that measure how well air moves in and out of your lungs.  Your physician recommends that you schedule a follow-up appointment in: in 3 months / dr Elease Hashimoto    DASH Diet The DASH diet stands for "Dietary Approaches to Stop Hypertension." It is a healthy eating plan that has been shown to reduce high blood pressure (hypertension) in as little as 14 days, while also possibly providing other significant health benefits. These other health benefits include reducing the risk of breast cancer after menopause and reducing the risk of type 2 diabetes, heart disease, colon cancer, and stroke. Health benefits also include weight loss and slowing kidney failure in patients with chronic kidney disease.  DIET GUIDELINES  Limit salt (sodium). Your diet should contain less than 1500 mg of sodium daily.  Limit refined or processed carbohydrates. Your diet should include mostly whole grains. Desserts and added sugars should be used sparingly.  Include small amounts of heart-healthy fats. These types of fats include nuts, oils, and tub margarine. Limit saturated and trans fats. These fats have been shown to be harmful in the body. CHOOSING FOODS  The following food groups are based on a 2000 calorie diet. See your Registered Dietitian for individual calorie needs. Grains and Grain Products (6 to 8 servings  daily)  Eat More Often: Whole-wheat bread, brown rice, whole-grain or wheat pasta, quinoa, popcorn without added fat or salt (air popped).  Eat Less Often: White bread, white pasta, white rice, cornbread. Vegetables (4 to 5 servings daily)  Eat More Often: Fresh, frozen, and canned vegetables. Vegetables may be raw, steamed, roasted, or grilled with a minimal amount of fat.  Eat Less Often/Avoid: Creamed or fried vegetables. Vegetables in a cheese sauce. Fruit (4 to 5 servings daily)  Eat More Often: All fresh, canned (in natural juice), or frozen fruits. Dried fruits without added sugar. One hundred percent fruit juice ( cup [237 mL] daily).  Eat Less Often: Dried fruits with added sugar. Canned fruit in light or heavy syrup. [employer], Fish, and Poultry (2 servings or less daily. One serving is 3 to 4 oz [85-114 g]).  Eat More Often: Ninety percent or leaner ground beef, tenderloin, sirloin. Round cuts of beef, chicken breast, [place] breast. All fish. Grill, bake, or broil your meat. Nothing should be fried.  Eat Less Often/Avoid: Fatty cuts of meat, [place], or chicken leg, thigh, or wing. Fried cuts of meat or fish. Dairy (2 to 3 servings)  Eat More Often: Low-fat or fat-free milk, low-fat plain or light yogurt, reduced-fat or part-skim cheese.  Eat Less Often/Avoid: Milk (whole, 2%).Whole milk yogurt. Full-fat cheeses. Nuts, Seeds, and Legumes (4 to 5 servings per week)  Eat More Often: All without added salt.  Eat Less Often/Avoid: Salted nuts and seeds, canned beans with added salt. Fats and Sweets (limited)  Eat More Often: Vegetable oils, tub margarines without trans fats, sugar-free gelatin.  Mayonnaise and salad dressings.  Eat Less Often/Avoid: Coconut oils, palm oils, butter, stick margarine, cream, half and half, cookies, candy, pie. FOR MORE INFORMATION The Dash Diet Eating Plan: www.dashdiet.org Document Released: 07/22/2011 Document Revised: 10/25/2011 Document  Reviewed: 07/22/2011 [facility] Patient Information 2014 [place], [place].

## 2013-01-12 NOTE — Progress Notes (Signed)
Logan Burton Date of Birth  Jun 20, 1956 [facility]     [place] Office  [address]    [address]   [address] [place], [place]  [address]    [place], [place]  [address] [phone]  Fax  [phone]  [phone]  Fax [phone]  Problem List:  1.  Status post CABG 2007 2. 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 Congestive heart failure-EF of 45% 4. Hyperlipidemia  5. Constipation 6. Left renal artery stenosis - s/p stenting ( [facility]) 7. CKD - baseline Creatinine is 2.0  History of Present Illness:  [name] is doing fairly well.   He continues to have episodes of shortness of breath particularly with any sort of exertion. His shortness breath has not worsened recently. He denies any episodes of chest pain. He is able to do all of his normal activities without any significant problems.  He still has significant dyspnea with heavy exertion.  He has been gradually getting stronger and stronger.  Jan 12, 2013:  Logan Burton continues to have some severe DOE.      Current Outpatient Prescriptions on File Prior to Visit  Medication Sig Dispense Refill  . amLODipine (NORVASC) 10 MG tablet Take 10 mg by mouth daily.      Marland Kitchen aspirin 325 MG tablet Take 325 mg by mouth daily.        Marland Kitchen co-enzyme Q-10 30 MG capsule Take 100 mg by mouth 2 (two) times daily.       . fenofibrate (TRICOR) 145 MG tablet Take 1 tablet (145 mg total) by mouth daily.  90 tablet  0  . metoprolol (TOPROL-XL) 200 MG 24 hr tablet Take 1 tablet (200 mg total) by mouth daily.  90 tablet  3   No current facility-administered medications on file prior to visit.    No Known Allergies  Past Medical History  Diagnosis Date  . Coronary artery disease   . 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 History of heart attack   . CHF (congestive heart failure)   . Chronic renal insufficiency   . PVD (peripheral vascular disease)   . Dyslipidemia   . Myocardial  infarction     Past Surgical History  Procedure Laterality Date  . Coronary artery bypass graft    . Cervical fusion    . Hernia repair    . Angioplasty      History  Smoking status  . Former Smoker -- 2.00 packs/day for 30 years  . Types: Cigarettes  . Quit date: 02/23/2006  Smokeless tobacco  . Never Used    History  Alcohol Use  . Yes    Comment: only rare social occasions    No family history on file.  Reviw of Systems:  Reviewed in the HPI.  All other systems are negative.  Physical Exam: Blood pressure 132/80, pulse 61, height 6\' 1"  (1.854 m), weight 197 lb (89.359 kg). General: Well developed, well nourished, in no acute distress.  Head: Normocephalic, atraumatic, sclera non-icteric, mucus membranes are moist,   Neck: Supple. Carotids are 2 + without bruits. No JVD  Lungs: Clear bilaterally to auscultation.  Heart: regular rate.  normal  S1 S2. No murmurs, gallops or rubs.  Abdomen: Soft, non-tender, non-distended with normal bowel sounds. No hepatomegaly. No rebound/guarding. No masses.  Msk:  Strength and tone are normal  Extremities: No clubbing or cyanosis. No edema.  Distal pedal pulses are  2+ and equal bilaterally.  Neuro: Alert and oriented X 3. Moves all extremities spontaneously.  Psych:  Responds to questions appropriately with a normal affect.  ECG:  Jan 12, 2013:  NSR at 23, RBBB, old lateral MI.    Assessment / Plan:

## 2013-01-12 NOTE — Assessment & Plan Note (Addendum)
Logan Burton is having more problems with shortness of breath with exertion. This had resolved  For a year or so but now seems to be worse. It is apparent that he still eating some extra salt. We'll give him information on the DASH diet.    We'll get an echocardiogram for further evaluation of his left ventricular size and function. We'll also get a Myoview study to make sure that is not having ischemic heart disease.  We'll check pulmonary  function tests.   Will order a CXR.  He has a long history cigarette smoking although he quit in 2007. We'll ambulate late him today with the pulse oximeter.   I seen again in 3 months for followup office visit.

## 2013-01-25 ENCOUNTER — Other Ambulatory Visit (INDEPENDENT_AMBULATORY_CARE_PROVIDER_SITE_OTHER): Payer: BC Managed Care – PPO

## 2013-01-25 ENCOUNTER — Ambulatory Visit (HOSPITAL_COMMUNITY): Payer: BC Managed Care – PPO | Attending: Cardiology | Admitting: Radiology

## 2013-01-25 VITALS — BP 114/79 | HR 56 | Ht 73.0 in | Wt 197.0 lb

## 2013-01-25 DIAGNOSIS — I10 Essential (primary) hypertension: Secondary | ICD-10-CM | POA: Insufficient documentation

## 2013-01-25 DIAGNOSIS — R0989 Other specified symptoms and signs involving the circulatory and respiratory systems: Secondary | ICD-10-CM | POA: Insufficient documentation

## 2013-01-25 DIAGNOSIS — I4510 Unspecified right bundle-branch block: Secondary | ICD-10-CM | POA: Insufficient documentation

## 2013-01-25 DIAGNOSIS — E785 Hyperlipidemia, unspecified: Secondary | ICD-10-CM

## 2013-01-25 DIAGNOSIS — R42 Dizziness and giddiness: Secondary | ICD-10-CM | POA: Insufficient documentation

## 2013-01-25 DIAGNOSIS — I739 Peripheral vascular disease, unspecified: Secondary | ICD-10-CM | POA: Insufficient documentation

## 2013-01-25 DIAGNOSIS — R5381 Other malaise: Secondary | ICD-10-CM | POA: Insufficient documentation

## 2013-01-25 DIAGNOSIS — R0600 Dyspnea, unspecified: Secondary | ICD-10-CM

## 2013-01-25 DIAGNOSIS — R9439 Abnormal result of other cardiovascular function study: Secondary | ICD-10-CM | POA: Insufficient documentation

## 2013-01-25 DIAGNOSIS — Z951 Presence of aortocoronary bypass graft: Secondary | ICD-10-CM

## 2013-01-25 DIAGNOSIS — R0609 Other forms of dyspnea: Secondary | ICD-10-CM | POA: Insufficient documentation

## 2013-01-25 DIAGNOSIS — R0602 Shortness of breath: Secondary | ICD-10-CM

## 2013-01-25 DIAGNOSIS — I5022 Chronic systolic (congestive) heart failure: Secondary | ICD-10-CM

## 2013-01-25 DIAGNOSIS — R002 Palpitations: Secondary | ICD-10-CM | POA: Insufficient documentation

## 2013-01-25 DIAGNOSIS — I2510 Atherosclerotic heart disease of native coronary artery without angina pectoris: Secondary | ICD-10-CM

## 2013-01-25 DIAGNOSIS — Z87891 Personal history of nicotine dependence: Secondary | ICD-10-CM | POA: Insufficient documentation

## 2013-01-25 LAB — LIPID PANEL
Cholesterol: 161 mg/dL (ref 0–200)
LDL Cholesterol: 105 mg/dL — ABNORMAL HIGH (ref 0–99)
VLDL: 18.6 mg/dL (ref 0.0–40.0)

## 2013-01-25 LAB — BASIC METABOLIC PANEL
BUN: 19 mg/dL (ref 6–23)
Chloride: 108 mEq/L (ref 96–112)
Creatinine, Ser: 1.8 mg/dL — ABNORMAL HIGH (ref 0.4–1.5)
GFR: 42.06 mL/min — ABNORMAL LOW (ref >60.00)
Glucose, Bld: 113 mg/dL — ABNORMAL HIGH (ref 70–99)
Potassium: 4.6 mEq/L (ref 3.5–5.1)

## 2013-01-25 LAB — HEPATIC FUNCTION PANEL
Alkaline Phosphatase: 36 U/L — ABNORMAL LOW (ref 39–117)
Bilirubin, Direct: 0 mg/dL (ref 0.0–0.3)
Total Bilirubin: 0.9 mg/dL (ref 0.3–1.2)
Total Protein: 7.3 g/dL (ref 6.0–8.3)

## 2013-01-25 MED ORDER — TECHNETIUM TC 99M SESTAMIBI GENERIC - CARDIOLITE
30.0000 | Freq: Once | INTRAVENOUS | Status: AC | PRN
  Administered 2013-01-25: 30 via INTRAVENOUS

## 2013-01-25 MED ORDER — REGADENOSON 0.4 MG/5ML IV SOLN
0.4000 mg | Freq: Once | INTRAVENOUS | Status: AC
  Administered 2013-01-25: 0.4 mg via INTRAVENOUS

## 2013-01-25 MED ORDER — TECHNETIUM TC 99M SESTAMIBI GENERIC - CARDIOLITE
10.0000 | Freq: Once | INTRAVENOUS | Status: AC | PRN
  Administered 2013-01-25: 10 via INTRAVENOUS

## 2013-01-25 NOTE — Progress Notes (Signed)
[facility] SITE 3 NUCLEAR MED [address] [place], [place] [address] [phone]    Cardiology Nuclear Med Study  Logan Burton is a 57 y.o. male     MRN : [id number]     DOB: February 26, 1956  Procedure Date: 01/25/2013  Nuclear Med Background Indication for Stress Test:  Evaluation for Ischemia and Graft Patency History:  '07 ILWMI>CABG; '10 Stress Echo:Abnormal, EF=50%; Cath:severe CAD, EF=35-40% Cardiac Risk Factors: History of Smoking, Hypertension, Lipids, PVD and RBBB  Symptoms:  Dizziness, DOE, Fatigue and Palpitations   Nuclear Pre-Procedure Caffeine/Decaff Intake:  None NPO After: 6pm   Lungs:  Clear. O2 Sat: 98% on room air. IV 0.9% NS with Angio Cath:  22g  IV Site: R Hand  IV Started by:  Bonnita Levan, RN  Chest Size (in):  44 Cup Size: n/a  Height: 6\' 1"  (1.854 m)  Weight:  197 lb (89.359 kg)  BMI:  Body mass index is 26 kg/(m^2). Tech Comments:  Toprol held x 48 hours    Nuclear Med Study 1 or 2 day study: 1 day  Stress Test Type:  Lexiscan  Reading MD: Marca Ancona, MD  Order Authorizing Provider:  Kristeen Miss, MD  Resting Radionuclide: Technetium 62m Sestamibi  Resting Radionuclide Dose: 11.0 mCi   Stress Radionuclide:  Technetium 68m Sestamibi  Stress Radionuclide Dose: 33.0 mCi           Stress Protocol Rest HR: 56 Stress HR: 75  Rest BP: 114/79 Stress BP: 121/79  Exercise Time (min): n/a METS: n/a   Predicted Max HR: 161 bpm % Max HR: 46.58 bpm Rate Pressure Product: 9075   Dose of Adenosine (mg):  n/a Dose of Lexiscan: 0.4 mg  Dose of Atropine (mg): n/a Dose of Dobutamine: n/a mcg/kg/min (at max HR)  Stress Test Technologist: Smiley Houseman, CMA-N  Nuclear Technologist:  Doyne Keel, CNMT     Rest Procedure:  Myocardial perfusion imaging was performed at rest 45 minutes following the intravenous administration of Technetium 68m Sestamibi.  Rest ECG: NSR-RBBB  Stress Procedure:  The patient received IV Lexiscan 0.4 mg over  15-seconds.  Technetium 34m Sestamibi injected at 30-seconds.  Quantitative spect images were obtained after a 45 minute delay.  Stress ECG: No significant change from baseline ECG  QPS Raw Data Images:  Normal; no motion artifact; normal heart/lung ratio. Stress Images:  Large, severe inferior and inferolateral perfusion defect. Rest Images:  Large, severe inferior and inferolateral perfusion defect.  Subtraction (SDS):  Fixed, large severe inferior and inferolateral perfusion defect.  Transient Ischemic Dilatation (Normal <1.22):  0.91 Lung/Heart Ratio (Normal <0.45):  0.29  Quantitative Gated Spect Images QGS EDV:  174 ml QGS ESV:  128 ml  Impression Exercise Capacity:  Lexiscan with no exercise. BP Response:  Normal blood pressure response. Clinical Symptoms:  Short of breath, lightheaded.  ECG Impression:  RBBB, no change with infusion.  Comparison with Prior Nuclear Study: No previous nuclear study performed  Overall Impression:  High risk stress nuclear study with large, severe fixed inferior and inferolateral perfusion defect.  There is no ischemia.  LV Ejection Fraction: 26%.  LV Wall Motion:  Inferior and inferolateral akinesis.  The remainder of the LV appears hypokinetic.   Marca Ancona 01/25/2013

## 2013-01-26 ENCOUNTER — Other Ambulatory Visit: Payer: BC Managed Care – PPO

## 2013-02-02 ENCOUNTER — Ambulatory Visit (HOSPITAL_COMMUNITY): Payer: BC Managed Care – PPO | Attending: Cardiology | Admitting: Radiology

## 2013-02-02 ENCOUNTER — Ambulatory Visit (INDEPENDENT_AMBULATORY_CARE_PROVIDER_SITE_OTHER): Payer: BC Managed Care – PPO | Admitting: Internal Medicine

## 2013-02-02 ENCOUNTER — Other Ambulatory Visit (HOSPITAL_COMMUNITY): Payer: BC Managed Care – PPO

## 2013-02-02 DIAGNOSIS — I2510 Atherosclerotic heart disease of native coronary artery without angina pectoris: Secondary | ICD-10-CM | POA: Insufficient documentation

## 2013-02-02 DIAGNOSIS — Z951 Presence of aortocoronary bypass graft: Secondary | ICD-10-CM

## 2013-02-02 DIAGNOSIS — R0600 Dyspnea, unspecified: Secondary | ICD-10-CM

## 2013-02-02 DIAGNOSIS — R0609 Other forms of dyspnea: Secondary | ICD-10-CM | POA: Insufficient documentation

## 2013-02-02 DIAGNOSIS — I5022 Chronic systolic (congestive) heart failure: Secondary | ICD-10-CM | POA: Insufficient documentation

## 2013-02-02 DIAGNOSIS — I509 Heart failure, unspecified: Secondary | ICD-10-CM | POA: Insufficient documentation

## 2013-02-02 DIAGNOSIS — R0989 Other specified symptoms and signs involving the circulatory and respiratory systems: Secondary | ICD-10-CM | POA: Insufficient documentation

## 2013-02-02 DIAGNOSIS — R0602 Shortness of breath: Secondary | ICD-10-CM

## 2013-02-02 LAB — PULMONARY FUNCTION TEST

## 2013-02-02 MED ORDER — PERFLUTREN PROTEIN A MICROSPH IV SUSP
2.0000 mL | Freq: Once | INTRAVENOUS | Status: AC
  Administered 2013-02-02: 2 mL via INTRAVENOUS

## 2013-02-02 NOTE — Progress Notes (Signed)
PFT done today. 

## 2013-02-02 NOTE — Progress Notes (Signed)
Echocardiogram performed with optison. Optison was given by Burna Mortimer Deal CNMT

## 2013-02-07 ENCOUNTER — Telehealth: Payer: Self-pay | Admitting: *Deleted

## 2013-02-07 DIAGNOSIS — R9430 Abnormal result of cardiovascular function study, unspecified: Secondary | ICD-10-CM

## 2013-02-07 DIAGNOSIS — R942 Abnormal results of pulmonary function studies: Secondary | ICD-10-CM

## 2013-02-07 NOTE — Telephone Encounter (Signed)
Message copied by Antony Odea on Wed Feb 07, 2013  4:56 PM ------      Message from: Rebecca, Deloris Ping      Created: Fri Feb 02, 2013 10:21 PM       Celine Ahr shows a large inferior lateral MI.  No ischemia.  Continue current meds.             ------

## 2013-02-07 NOTE — Telephone Encounter (Signed)
Pt is to be ref to EP for consult for low EF/ ICD

## 2013-02-07 NOTE — Telephone Encounter (Signed)
msg left to call back for pulm test results, informed him he will need to see a pulmonologist and he will here from our office to schedule, asked him to call for results which were DX: mild copd, i will take to MR to have scanned in.

## 2013-02-08 ENCOUNTER — Telehealth: Payer: Self-pay | Admitting: *Deleted

## 2013-02-08 NOTE — Telephone Encounter (Signed)
Message copied by Antony Odea on Thu Feb 08, 2013  3:55 PM ------      Message from: Omar Person B      Created: Thu Feb 08, 2013  3:30 PM      Regarding: RE: ep consult/pulmonary consult       Patient is aware and E-mail sent.       02-23-13 @ 3 pm with Dr. Sherene Sires      03-02-13 @ 8:30 with Dr. Graciela Husbands      ----- Message -----         From: Connye Burkitt         Sent: 02/08/2013  12:06 PM           To: Pricilla Holm, Antony Odea, RN      Subject: ep consult/pulmonary consult                                Message forward to sharon. We are working by [employer]      ----- Message -----         From: Antony Odea, RN         Sent: 02/07/2013   4:58 PM           To: Antony Odea, RN, [facility]            Needs app/ consult with EP for low EF,       He also needs pulm consult, pt needs Friday app with both please, thank you, jodette             ------

## 2013-02-08 NOTE — Telephone Encounter (Signed)
noted 

## 2013-02-23 ENCOUNTER — Ambulatory Visit (INDEPENDENT_AMBULATORY_CARE_PROVIDER_SITE_OTHER)
Admission: RE | Admit: 2013-02-23 | Discharge: 2013-02-23 | Disposition: A | Discharge status: Home / Self Care | Payer: BC Managed Care – PPO | Source: Ambulatory Visit | Attending: Internal Medicine | Admitting: Internal Medicine

## 2013-02-23 ENCOUNTER — Ambulatory Visit (INDEPENDENT_AMBULATORY_CARE_PROVIDER_SITE_OTHER): Payer: BC Managed Care – PPO | Admitting: Internal Medicine

## 2013-02-23 ENCOUNTER — Encounter: Payer: Self-pay | Admitting: Internal Medicine

## 2013-02-23 VITALS — BP 126/74 | HR 63 | Temp 98.6°F | Ht 72.0 in | Wt 198.0 lb

## 2013-02-23 DIAGNOSIS — J449 Chronic obstructive pulmonary disease, unspecified: Secondary | ICD-10-CM

## 2013-02-23 MED ORDER — ACLIDINIUM BROMIDE 400 MCG/ACT IN AEPB: 1.0000 | INHALATION_SPRAY | Freq: Two times a day (BID) | RESPIRATORY_TRACT | Status: DC

## 2013-02-23 NOTE — Patient Instructions (Addendum)
tudorza one twice daily x 6 week trial  Please remember to go to the x-ray department downstairs for your tests - we will call you with the results when they are available.     Please schedule a follow up office visit in 6 weeks, call sooner if needed

## 2013-02-23 NOTE — Progress Notes (Signed)
  Subjective:    Patient ID: Logan Burton, male    DOB: 02-Jan-1956  MRN: [id number]  HPI  57 yo [place] male quit smoking 2007 at cabg at wt 140 and progressive wt gain doe since then referred 02/23/2013 to pulmonary clinic  by [facility] to pulmonary clinic with abn pft.  Primary is [name]   02/23/2013 1st pulmonary eval cc indolent but progressive worse doe x 7 years esp x oneyear to point where sob to mailbox and back and occ sob at rest and p sleeping p 2 hours nightly. Also every time bends over. Has never tried an inhaler. No tendency to exacerbations   No obvious daytime variabilty or assoc chronic cough or cp or chest tightness, subjective wheeze overt sinus or hb symptoms. No unusual exp hx or h/o childhood pna/ asthma or knowledge of premature birth.    Also denies any obvious fluctuation of symptoms with weather or environmental changes or other aggravating or alleviating factors except as outlined above    Review of Systems  Constitutional: Negative for fever, chills, activity change, appetite change and unexpected weight change.  HENT: Negative for congestion, sore throat, rhinorrhea, sneezing, trouble swallowing, dental problem, voice change and postnasal drip.   Eyes: Negative for visual disturbance.  Respiratory: Positive for shortness of breath. Negative for cough and choking.   Cardiovascular: Negative for chest pain and leg swelling.  Gastrointestinal: Negative for nausea, vomiting and abdominal pain.  Genitourinary: Negative for difficulty urinating.  Musculoskeletal: Negative for arthralgias.  Skin: Negative for rash.  Psychiatric/Behavioral: Negative for behavioral problems and confusion.       Objective:   Physical Exam  amb very pleasant male nad  Wt Readings from Last 3 Encounters:  02/23/13 198 lb (89.812 kg)  01/25/13 197 lb (89.359 kg)  01/12/13 197 lb (89.359 kg)    HEENT mild turbinate edema.  Oropharynx edentulous with dentures in place no thrush  or excess pnd or cobblestoning.  No JVD or cervical adenopathy. Mild accessory muscle hypertrophy. Trachea midline, nl thryroid. Chest was hyperinflated by percussion with diminished breath sounds and slt  increased exp time without wheeze. Hoover sign positive at  End  inspiration. Regular rate and rhythm without murmur gallop or rub or increase P2 or edema.  Abd: mod obese no hsm, nl excursion. Ext warm without cyanosis or clubbing.    CXR  02/23/2013 :   1. No evidence of active cardiopulmonary disease.  2. 5 mm nodule overlapping the lower left lung. The density for  size favors calcification      Assessment & Plan:

## 2013-02-24 NOTE — Assessment & Plan Note (Addendum)
-   PFTs 02/02/13  FEV1  2.58 (66%) ratio 70 with FV curve c/w obst - 02/23/2013  Walked RA x 3 laps @ 185 ft each stopped due to  End of study, no sob or desat - tudorza trial 02/23/2013 >>>   When respiratory symptoms begin or become refractory well after a patient reports complete smoking cessation,  Especially when this wasn't the case while they were smoking, a red flag is raised based on the work of Dr Primitivo Gauze which states:  if you quit smoking when your best day FEV1 is still well preserved it is highly unlikely you will progress to severe disease - this is clearly the case here as well   That is to say, once the smoking stops,  the symptoms should not suddenly erupt or markedly worsen.  If so, the differential diagnosis should include  obesity/deconditioning,  LPR/Reflux/Aspiration syndromes,  occult CHF, or  especially side effect of medications commonly used in this population.  I strongly doubt his copd explains any of his symptoms but he may enough "small airways dz" to benefit from [place] trial  The proper method of use, as well as anticipated side effects, of a metered-dose inhaler are discussed and demonstrated to the patient. Improved effectiveness after extensive coaching during this visit to a level of approximately  90% so given 6 week trial

## 2013-03-02 ENCOUNTER — Encounter: Payer: Self-pay | Admitting: Internal Medicine

## 2013-03-02 ENCOUNTER — Ambulatory Visit (INDEPENDENT_AMBULATORY_CARE_PROVIDER_SITE_OTHER): Payer: BC Managed Care – PPO | Admitting: Internal Medicine

## 2013-03-02 VITALS — BP 111/74 | HR 59 | Ht 72.0 in | Wt 196.0 lb

## 2013-03-02 DIAGNOSIS — I2589 Other forms of chronic ischemic heart disease: Secondary | ICD-10-CM

## 2013-03-02 DIAGNOSIS — I739 Peripheral vascular disease, unspecified: Secondary | ICD-10-CM

## 2013-03-02 DIAGNOSIS — I255 Ischemic cardiomyopathy: Secondary | ICD-10-CM | POA: Insufficient documentation

## 2013-03-02 DIAGNOSIS — I428 Other cardiomyopathies: Secondary | ICD-10-CM

## 2013-03-02 DIAGNOSIS — I429 Cardiomyopathy, unspecified: Secondary | ICD-10-CM

## 2013-03-02 NOTE — Assessment & Plan Note (Signed)
He has claudication and has history of present illness that were quite striking 7 years ago. It would be appropriate for him to see somebody with regarding his vascular disease.

## 2013-03-02 NOTE — Assessment & Plan Note (Signed)
As above.

## 2013-03-02 NOTE — Patient Instructions (Addendum)
Your physician recommends that you schedule a follow-up appointment in: AS NEEDED  

## 2013-03-02 NOTE — Assessment & Plan Note (Signed)
The patient has ischemic cardiomyopathy and class III congestive heart failure. His medical regime is limited to high-dose beta blockers; he is on Toprol 200 mg. His creatinine is 1.8 and he is not on ACE inhibitors. Nor is he on Aldactone. It is also not on hydralazine nitrates. I think prior to pursuing device implantation, we should try to get him on maximum medical therapy. I sought to discuss this with his nephrologist today who is on vacation. We'll need to do that when he returns.  In the event that he is not a candidate for either ACE inhibitor therapy for Aldactone I would put him on hydralazine nitrates. He would then need reassessment of LV function.  Furthermore, with his right bundle branch block and class III symptoms if they do not improve, he would be a reasonable candidate to consider CRT

## 2013-03-02 NOTE — Progress Notes (Signed)
ELECTROPHYSIOLOGY CONSULT NOTE  Patient ID: Logan Burton, MRN: [id number], DOB/AGE: 1955-11-24 57 y.o. Admit date: (Not on file) Date of Consult: 03/02/2013  Primary Physician: [name], MD Primary Cardiologist: PNahser   Chief Complaint:     HPI Logan Burton is a 57 y.o. male   Referred for consideration of an ICD.  He has a history of ischemic cardiomyopathy. He had an inferolateral MI and underwent cardiac bypass surgery in 2007. Last catheterization 2010 demonstrated occlusion of his circumflex graft, patency of his LIMA with a 50% stenosis at the anastomosis,RCA bheavily diseased with the patent but small graft. Ejection fraction at that time was 35-40%  He has severe shortness of breath. He was recently seen by Dr. Sherene Sires.  Conclusions are not clear to me. No BNP is on the record.  He has renal insufficiency with creatinine 1.7 range. He is on beta blockers at high doses.  He has claudication. ABIs in 2000 at 0.40.5.    Past Medical History  Diagnosis Date  . Coronary artery disease   . 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 History of heart attack   . CHF (congestive heart failure)   . Chronic renal insufficiency   . PVD (peripheral vascular disease)   . Dyslipidemia   . Myocardial infarction       Surgical History:  Past Surgical History  Procedure Laterality Date  . Coronary artery bypass graft    . Cervical fusion    . Hernia repair    . Angioplasty       Home Meds: Prior to Admission medications   Medication Sig Start Date End Date Taking? Authorizing Provider  Aclidinium Bromide (TUDORZA PRESSAIR) 400 MCG/ACT AEPB Inhale 1 puff into the lungs 2 (two) times daily. One twice daily 02/23/13  Yes Nyoka Cowden, MD  amLODipine (NORVASC) 10 MG tablet Take 10 mg by mouth daily.   Yes Historical Provider, MD  aspirin 325 MG tablet Take 325 mg by mouth daily.     Yes Historical Provider, MD  atorvastatin (LIPITOR) 80 MG tablet Take 80 mg by  mouth daily.   Yes Historical Provider, MD  buPROPion (WELLBUTRIN SR) 150 MG 12 hr tablet Take 150 mg by mouth daily.   Yes Historical Provider, MD  escitalopram (LEXAPRO) 20 MG tablet Take 1 tablet by mouth daily. 12/18/12  Yes Historical Provider, MD  fenofibrate (TRICOR) 145 MG tablet Take 1 tablet (145 mg total) by mouth daily. 12/20/12  Yes Vesta Mixer, MD  metoprolol (TOPROL-XL) 200 MG 24 hr tablet Take 1 tablet (200 mg total) by mouth daily. 06/06/12  Yes Vesta Mixer, MD  Omega-3 Fatty Acids (FISH OIL) 600 MG CAPS Take 1 capsule by mouth daily.   Yes Historical Provider, MD  Vitamin D, Ergocalciferol, (DRISDOL) 50000 UNITS CAPS once a week. 11/23/12  Yes Historical Provider, MD       Allergies: No Known Allergies  History   Social History  . Marital Status: Divorced    Spouse Name: N/A    Number of Children: N/A  . Years of Education: N/A   Occupational History  . [employer]    Social History Main Topics  . Smoking status: Former Smoker -- 2.00 packs/day for 30 years    Types: Cigarettes    Quit date: 02/23/2006  . Smokeless tobacco: Never Used  . Alcohol Use: Yes     Comment: only rare social occasions  . Drug Use:  No  . Sexually Active: Not Currently   Other Topics Concern  . Not on file   Social History Narrative  . No narrative on file     No family history on file.   ROS:  Please see the history of present illness.   Negative except depression  All other systems reviewed and negative.    Physical Exam:   Blood pressure 111/74, pulse 59, height 6' (1.829 m), weight 196 lb (88.905 kg). General: Well developed, well nourished male in no acute distress. Head: Normocephalic, atraumatic, sclera non-icteric, no xanthomas, nares are without discharge. EENT: normal Lymph Nodes:  none Back: without scoliosis/kyphosis , no CVA tendersness Neck: Negative for carotid bruits. JVD not elevated. Lungs: Clear bilaterally to auscultation without wheezes,  rales, or rhonchi. Breathing is unlabored. Heart: RRR with S1 S2. No murmur , rubs, or gallops appreciated. Abdomen: Soft, non-tender, non-distended with normoactive bowel sounds. No hepatomegaly. No rebound/guarding. No obvious abdominal masses. Msk:  Strength and tone appear normal for age. Extremities: No clubbing or cyanosis. No edema.  Distal pedal pulses are 2+ and equal bilaterally. Skin: Warm and Dry Neuro: Alert and oriented X 3. CN III-XII intact Grossly normal sensory and motor function . Psych:  Responds to questions appropriately with a normal affect.      Labs: Cardiac Enzymes No results found for this basename: CKTOTAL, CKMB, TROPONINI,  in the last 72 hours CBC Lab Results  Component Value Date   WBC 6.1 08/27/2011   HGB 13.8 08/27/2011   HCT 40.6 08/27/2011   MCV 87.5 08/27/2011   PLT 170 08/27/2011   PROTIME: No results found for this basename: LABPROT, INR,  in the last 72 hours Chemistry No results found for this basename: NA, K, CL, CO2, BUN, CREATININE, CALCIUM, LABALBU, PROT, BILITOT, ALKPHOS, ALT, AST, GLUCOSE,  in the last 168 hours Lipids Lab Results  Component Value Date   CHOL 161 01/25/2013   HDL 37.00* 01/25/2013   LDLCALC 105* 01/25/2013   TRIG 93.0 01/25/2013   BNP No results found for this basename: probnp   Miscellaneous No results found for this basename: DDIMER    Radiology/Studies:  Dg Chest 2 View  02/23/2013   *RADIOLOGY REPORT*  Clinical Data: Shortness of breath, worsening.  COPD.  CHEST - 2 VIEW  Comparison: 06/03/2010.  Findings: No acute osseous findings.  No cardiomegaly.  Status post CABG.  Tortuous thoracic aorta, unchanged morphology from prior.  Hyperinflated lungs.  No infiltrate, edema, effusion, pneumothorax.  There is a 5 mm nodular opacity projecting over the peripheral left lower lung.  This was not seen previously.  Density for size favors calcification.  IMPRESSION:  1.  No evidence of active cardiopulmonary disease. 2.  5 mm  nodule overlapping the lower left lung.  The density for size favors calcification, but given COPD, CT confirmation or follow up radiography is suggested.   Original Report Authenticated By: Tiburcio Pea    EKG sinus rhythm at 59 Intervals 15/14/44 Axis CCXLVII Inferior lateral Q waves   Assessment and Plan:    Sherryl Manges

## 2013-04-06 ENCOUNTER — Ambulatory Visit: Payer: BC Managed Care – PPO | Admitting: Internal Medicine

## 2013-04-13 ENCOUNTER — Encounter: Payer: Self-pay | Admitting: Internal Medicine

## 2013-04-13 ENCOUNTER — Ambulatory Visit (INDEPENDENT_AMBULATORY_CARE_PROVIDER_SITE_OTHER): Payer: BC Managed Care – PPO | Admitting: Internal Medicine

## 2013-04-13 VITALS — BP 118/70 | HR 55 | Temp 98.0°F | Ht 72.0 in | Wt 193.0 lb

## 2013-04-13 DIAGNOSIS — J449 Chronic obstructive pulmonary disease, unspecified: Secondary | ICD-10-CM

## 2013-04-13 NOTE — Progress Notes (Signed)
  Subjective:    Patient ID: Logan Burton, male    DOB: 1956-01-25  MRN: [id number]  Primary is Logan Burton   Brief patient profile:  57 yo [place] male quit smoking 2007 at cabg at wt 140 and progressive wt gain doe since then referred 02/23/2013 to pulmonary clinic  by [facility] to pulmonary clinic with minimal airflow osbruction by pfts 02/02/13     HPI 02/23/2013 1st pulmonary eval cc indolent but progressive worse doe x 7 years esp x one year to point where sob to mailbox and back and occ sob at rest and p sleeping p 2 hours nightly. Also every time bends over. Has never tried an inhaler. No tendency to exacerbations. rec tudorza  04/13/2013 f/u ov/[place] [facility]  Chief Complaint  Patient presents with  . Follow-up    Pt reports breathing is unchanged since his last visit. No new co's today.     Admits very inactive, no change doe   No obvious daytime variabilty or assoc chronic cough or cp or chest tightness, subjective wheeze overt sinus or hb symptoms. No unusual exp hx or h/o childhood pna/ asthma or knowledge of premature birth.   Sleeping ok without nocturnal  or early am exacerbation  of respiratory  c/o's or need for noct saba. Also denies any obvious fluctuation of symptoms with weather or environmental changes or other aggravating or alleviating factors except as outlined above  Current Medications, Allergies, Past Medical History, Past Surgical History, Family History, and Social History were reviewed in [employer] record.  ROS  The following are not active complaints unless bolded sore throat, dysphagia, dental problems, itching, sneezing,  nasal congestion or excess/ purulent secretions, ear ache,   fever, chills, sweats, unintended wt loss, pleuritic or exertional cp, hemoptysis,  orthopnea pnd or leg swelling, presyncope, palpitations, heartburn, abdominal pain, anorexia, nausea, vomiting, diarrhea  or change in bowel or urinary habits, change in stools or  urine, dysuria,hematuria,  rash, arthralgias, visual complaints, headache, numbness weakness or ataxia or problems with walking or coordination,  change in mood/affect or memory.             Objective:   Physical Exam  amb very pleasant male nad  Wt Readings from Last 3 Encounters:  04/13/13 193 lb (87.544 kg)  03/02/13 196 lb (88.905 kg)  02/23/13 198 lb (89.812 kg)      HEENT mild turbinate edema.  Oropharynx edentulous with dentures in place no thrush or excess pnd or cobblestoning.  No JVD or cervical adenopathy. Mild accessory muscle hypertrophy. Trachea midline, nl thryroid. Chest was hyperinflated by percussion with diminished breath sounds and slt  increased exp time without wheeze. Hoover sign positive at  End  inspiration. Regular rate and rhythm without murmur gallop or rub or increase P2 or edema.  Abd: mod obese no hsm, nl excursion. Ext warm without cyanosis or clubbing.    CXR  02/23/2013 :   1. No evidence of active cardiopulmonary disease.  2. 5 mm nodule overlapping the lower left lung. The density for  size favors calcification      Assessment & Plan:

## 2013-04-13 NOTE — Patient Instructions (Addendum)
We will call you in Jan 2015 and make sure you have a follow up cxr   Weight control is simply a matter of calorie balance which needs to be tilted in your favor by eating less and exercising more.  To get the most out of exercise, you need to be continuously aware that you are short of breath, but never out of breath, for 30 minutes daily. As you improve, it will actually be easier for you to do the same amount of exercise  in  30 minutes so always push to the level where you are short of breath.  If this does not result in gradual weight reduction then I strongly recommend you see a nutritionist with a food diary x 2 weeks so that we can work out a negative calorie balance which is universally effective in steady weight loss programs.  Think of your calorie balance like you do your bank account where in this case you want the balance to go down so you must take in less calories than you burn up.  It's just that simple:  Hard to do, but easy to understand.  Good luck!   Once you've been walking consistently try off tudorza to see what difference if any this makes

## 2013-04-14 NOTE — Assessment & Plan Note (Signed)
-   PFTs 02/02/13  FEV1  2.58 (66%) ratio 70 with FV curve c/w obst - 02/23/2013  Walked RA x 3 laps @ 185 ft each stopped due to  End of study, no sob or desat - tudorza trial 02/23/2013 > no def benefit so d/c when samples done   I had an extended discussion with the patient today lasting 15 to 20 minutes of a 25 minute visit on the following issues:  His main problem appears to be obesity deconditioning, not copd> See instructions for specific recommendations which were reviewed directly with the patient who was given a copy with highlighter outlining the key components.

## 2013-04-20 ENCOUNTER — Other Ambulatory Visit: Payer: Self-pay | Admitting: Cardiovascular Disease

## 2013-04-26 ENCOUNTER — Other Ambulatory Visit: Payer: Self-pay | Admitting: Cardiovascular Disease

## 2013-04-27 ENCOUNTER — Other Ambulatory Visit: Payer: Self-pay | Admitting: Cardiovascular Disease

## 2013-04-27 ENCOUNTER — Ambulatory Visit (INDEPENDENT_AMBULATORY_CARE_PROVIDER_SITE_OTHER): Payer: BC Managed Care – PPO | Admitting: Cardiovascular Disease

## 2013-04-27 ENCOUNTER — Encounter: Payer: Self-pay | Admitting: Cardiovascular Disease

## 2013-04-27 VITALS — BP 106/77 | HR 60 | Ht 72.0 in | Wt 195.6 lb

## 2013-04-27 DIAGNOSIS — I5022 Chronic systolic (congestive) heart failure: Secondary | ICD-10-CM

## 2013-04-27 DIAGNOSIS — I10 Essential (primary) hypertension: Secondary | ICD-10-CM

## 2013-04-27 DIAGNOSIS — I509 Heart failure, unspecified: Secondary | ICD-10-CM

## 2013-04-27 DIAGNOSIS — N289 Disorder of kidney and ureter, unspecified: Secondary | ICD-10-CM

## 2013-04-27 MED ORDER — HYDRALAZINE HCL 25 MG PO TABS: 12.5000 mg | ORAL_TABLET | Freq: Three times a day (TID) | ORAL | Status: DC

## 2013-04-27 MED ORDER — AMLODIPINE BESYLATE 5 MG PO TABS: 5.0000 mg | ORAL_TABLET | Freq: Every day | ORAL | Status: DC

## 2013-04-27 NOTE — Progress Notes (Addendum)
Gerre Pebbles Date of Birth  07/10/1956 [facility]     [facility]  [address]    [address]   [address] [place], [place]  [address]    [place], [place]  [address] [phone]  Fax  [phone]  [phone]  Fax [phone]  Problem List:  1.  Status post CABG 2007 2. 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 Congestive heart failure-EF of 45% 4. Hyperlipidemia  5. Constipation 6. Left renal artery stenosis - s/p stenting ( [facility]) 7. CKD - baseline Creatinine is 2.0  History of Present Illness:  Vandy is doing fairly well.   He continues to have episodes of shortness of breath particularly with any sort of exertion. His shortness breath has not worsened recently. He denies any episodes of chest pain. He is able to do all of his normal activities without any significant problems.  He still has significant dyspnea with heavy exertion.  He has been gradually getting stronger and stronger.  Jan 12, 2013:  Edwar continues to have some severe DOE.     Sept. 12, 2014:  Yosmar is about the same.  He has been tried on an inhaler.  He had an echocardiogram in June, 2014. His son have an ejection fraction of around 30-35%.  He has been seen by Dr. Graciela Husbands. Dr. Graciela Husbands wanted him on additional medical therapy before considering an ICD. He has chronic kidney disease with a creatinine of around 1.8. We have avoided ACE inhibitors because of that creatinine. We have also been very cautious about starting Aldactone.   He had PFTs and was found to have mild COPD.     Current Outpatient Prescriptions on File Prior to Visit  Medication Sig Dispense Refill  . Aclidinium Bromide (TUDORZA PRESSAIR) 400 MCG/ACT AEPB Inhale 1 puff into the lungs 2 (two) times daily. One twice daily  1 each  11  . amLODipine (NORVASC) 10 MG tablet Take 10 mg by mouth daily.      Marland Kitchen aspirin 325 MG tablet Take 325 mg by mouth daily.        Marland Kitchen atorvastatin (LIPITOR) 80 MG  tablet TAKE 1 TABLET (80 MG TOTAL) BY MOUTH DAILY.  90 tablet  1  . buPROPion (WELLBUTRIN SR) 150 MG 12 hr tablet Take 150 mg by mouth daily.      Marland Kitchen escitalopram (LEXAPRO) 20 MG tablet Take 1 tablet by mouth daily.      . metoprolol (TOPROL-XL) 200 MG 24 hr tablet Take 1 tablet (200 mg total) by mouth daily.  90 tablet  3  . Omega-3 Fatty Acids (FISH OIL) 600 MG CAPS Take 1 capsule by mouth daily.      . Vitamin D, Ergocalciferol, (DRISDOL) 50000 UNITS CAPS once a week.       No current facility-administered medications on file prior to visit.    No Known Allergies  Past Medical History  Diagnosis Date  . Coronary artery disease   . 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 History of heart attack   . CHF (congestive heart failure)   . Chronic renal insufficiency   . PVD (peripheral vascular disease)   . Dyslipidemia   . Myocardial infarction     Past Surgical History  Procedure Laterality Date  . Coronary artery bypass graft    . Cervical fusion    . Hernia repair    . Angioplasty  History  Smoking status  . Former Smoker -- 2.00 packs/day for 30 years  . Types: Cigarettes  . Quit date: 02/23/2006  Smokeless tobacco  . Never Used    History  Alcohol Use  . Yes    Comment: only rare social occasions    No family history on file.  Reviw of Systems:  Reviewed in the HPI.  All other systems are negative.  Physical Exam: Blood pressure 106/77, pulse 60, height 6' (1.829 m). General: Well developed, well nourished, in no acute distress.  Head: Normocephalic, atraumatic, sclera non-icteric, mucus membranes are moist,   Neck: Supple. Carotids are 2 + without bruits. No JVD  Lungs: Clear bilaterally to auscultation.  Heart: regular rate.  normal  S1 S2. No murmurs, gallops or rubs.  Abdomen: Soft, non-tender, non-distended with normal bowel sounds. No hepatomegaly. No rebound/guarding. No masses.  Msk:  Strength and tone are  normal  Extremities: No clubbing or cyanosis. No edema.  Distal pedal pulses are 2+ and equal bilaterally.  Neuro: Alert and oriented X 3. Moves all extremities spontaneously.  Psych:  Responds to questions appropriately with a normal affect.  ECG:  Jan 12, 2013:  NSR at 66, RBBB, old lateral MI.    Assessment / Plan:

## 2013-04-27 NOTE — Patient Instructions (Addendum)
Your physician recommends that you schedule a follow-up appointment in: 3 WEEKS WITH [name] NP Your physician recommends that you return for lab work in: 3 WEEKS // BMET  Your physician recommends that you schedule a follow-up appointment in: 3 MONTHS WITH DR [facility]  Your physician has recommended you make the following change in your medication:   DECREASE AMLODIPINE TO 5 MG DAILY START HYDRALAZINE 12.5 MG THREE TIMES A DAY/ EVERY 8 HOURS, TABLET WILL BE 25 MG AND YOU WILL BREAK IN HALF.

## 2013-05-18 ENCOUNTER — Ambulatory Visit (INDEPENDENT_AMBULATORY_CARE_PROVIDER_SITE_OTHER): Payer: BC Managed Care – PPO | Admitting: Nurse Practitioner

## 2013-05-18 ENCOUNTER — Other Ambulatory Visit: Payer: BC Managed Care – PPO

## 2013-05-18 ENCOUNTER — Encounter: Payer: Self-pay | Admitting: Nurse Practitioner

## 2013-05-18 ENCOUNTER — Other Ambulatory Visit: Payer: Self-pay | Admitting: Cardiovascular Disease

## 2013-05-18 VITALS — BP 110/70 | HR 64 | Ht 72.0 in | Wt 194.4 lb

## 2013-05-18 DIAGNOSIS — I5022 Chronic systolic (congestive) heart failure: Secondary | ICD-10-CM

## 2013-05-18 LAB — BASIC METABOLIC PANEL
BUN: 22 mg/dL (ref 6–23)
CO2: 24 mEq/L (ref 19–32)
Calcium: 9.5 mg/dL (ref 8.4–10.5)
Chloride: 108 mEq/L (ref 96–112)
Creatinine, Ser: 1.8 mg/dL — ABNORMAL HIGH (ref 0.4–1.5)
GFR: 42.57 mL/min — ABNORMAL LOW (ref >60.00)
Glucose, Bld: 102 mg/dL — ABNORMAL HIGH (ref 70–99)
Potassium: 4.4 mEq/L (ref 3.5–5.1)
Sodium: 137 mEq/L (ref 135–145)

## 2013-05-18 MED ORDER — HYDRALAZINE HCL 25 MG PO TABS: 25.0000 mg | ORAL_TABLET | Freq: Three times a day (TID) | ORAL | Status: DC

## 2013-05-18 NOTE — Progress Notes (Signed)
Logan Burton Date of Birth: 12-07-1955 Medical Record #[id number]  History of Present Illness: Mr. Logan Burton is seen back today for a 3 week check. Seen for Dr. Elease Hashimoto. 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 Reduced EF, HLD, left renal artery stenosis with past stenting and CKD - with only one functioning kidney. Last catheterization 2010 demonstrated occlusion of his circumflex graft, patency of his LIMA with a 50% stenosis at the anastomosis,RCA heavily diseased with a patent but small graft. Ejection fraction at that time was 35-40%  Has had his last echo in June - EF is 30 to 35% - Dr. Graciela Husbands wanted him on additional medical therapy before considering ICD. He has not been on ACE due to his CKD and being cautious about starting Aldactone as well.   Last here 3 weeks - started Hydralazine and cut the Norvasc back as well. No nitrates added.   Comes in today. Here alone. Remains short of breath - says it is no worse but no better. Weight is stable. No swelling. No chest pain. Little dizziness if stands up too quick. No syncope. Using Viagra for ED - not on his list. He is not interested in taking nitrates.   Current Outpatient Prescriptions  Medication Sig Dispense Refill  . Aclidinium Bromide (TUDORZA PRESSAIR) 400 MCG/ACT AEPB Inhale 1 puff into the lungs 2 (two) times daily. One twice daily  1 each  11  . amLODipine (NORVASC) 5 MG tablet Take 1 tablet (5 mg total) by mouth daily.  90 tablet  3  . aspirin 325 MG tablet Take 325 mg by mouth daily.        Marland Kitchen atorvastatin (LIPITOR) 80 MG tablet TAKE 1 TABLET (80 MG TOTAL) BY MOUTH DAILY.  90 tablet  1  . buPROPion (WELLBUTRIN SR) 150 MG 12 hr tablet Take 150 mg by mouth daily.      Marland Kitchen escitalopram (LEXAPRO) 20 MG tablet Take 1 tablet by mouth daily.      . fenofibrate (TRICOR) 145 MG tablet Take 1 tab daily      . hydrALAZINE (APRESOLINE) 25 MG tablet Take 0.5 tablets (12.5 mg total) by mouth 3 (three) times daily.  45  tablet  6  . metoprolol (TOPROL-XL) 200 MG 24 hr tablet Take 1 tablet (200 mg total) by mouth daily.  90 tablet  3  . Omega-3 Fatty Acids (FISH OIL) 600 MG CAPS Take 1 capsule by mouth daily.      . Vitamin D, Ergocalciferol, (DRISDOL) 50000 UNITS CAPS once a week.       No current facility-administered medications for this visit.    No Known Allergies  Past Medical History  Diagnosis Date  . Coronary artery disease   . 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 History of heart attack   . CHF (congestive heart failure)   . Chronic renal insufficiency   . PVD (peripheral vascular disease)   . Dyslipidemia   . Myocardial infarction     Past Surgical History  Procedure Laterality Date  . Coronary artery bypass graft    . Cervical fusion    . Hernia repair    . Angioplasty      History  Smoking status  . Former Smoker -- 2.00 packs/day for 30 years  . Types: Cigarettes  . Quit date: 02/23/2006  Smokeless tobacco  . Never Used    History  Alcohol Use  . Yes  Comment: only rare social occasions    History reviewed. No pertinent family history.  Review of Systems: The review of systems is per the HPI.  All other systems were reviewed and are negative.  Physical Exam: BP 110/70  Pulse 64  Ht 6' (1.829 m)  Wt 194 lb 6.4 oz (88.179 kg)  BMI 26.36 kg/m2 Patient is a very pleasant [place] who is in no acute distress. Skin is warm and dry. Color is normal.  HEENT is unremarkable. Normocephalic/atraumatic. PERRL. Sclera are nonicteric. Neck is supple. No masses. No JVD. Lungs are clear. Cardiac exam shows a regular rate and rhythm. Abdomen is soft. Extremities are without edema. Gait and ROM are intact. No gross neurologic deficits noted.  LABORATORY DATA: BMET is pending  Lab Results  Component Value Date   WBC 6.1 08/27/2011   HGB 13.8 08/27/2011   HCT 40.6 08/27/2011   PLT 170 08/27/2011   GLUCOSE 113* 01/25/2013   CHOL 161 01/25/2013   TRIG 93.0  01/25/2013   HDL 37.00* 01/25/2013   LDLCALC 105* 01/25/2013   ALT 20 01/25/2013   AST 19 01/25/2013   NA 138 01/25/2013   K 4.6 01/25/2013   CL 108 01/25/2013   CREATININE 1.8* 01/25/2013   BUN 19 01/25/2013   CO2 24 01/25/2013   INR 1.12 08/27/2011   Echo Study Conclusions from June 2014  - Left ventricle: The cavity size was normal. Wall thickness was increased in a pattern of mild LVH. Systolic function was moderately to severely reduced. The estimated ejection fraction was in the range of 30% to 35%. There is akinesis of the inferolateral and posterior myocardium. Features are consistent with a pseudonormal left ventricular filling pattern, with concomitant abnormal relaxation and increased filling pressure (grade 2 diastolic dysfunction). - Left atrium: The atrium was mildly dilated. - Right ventricle: The cavity size was mildly dilated. Systolic function was mildly reduced.  Myoview Impression from June 2014  Exercise Capacity: Lexiscan with no exercise.  BP Response: Normal blood pressure response.  Clinical Symptoms: Short of breath, lightheaded.  ECG Impression: RBBB, no change with infusion.  Comparison with Prior Nuclear Study: No previous nuclear study performed  Overall Impression: High risk stress nuclear study with large, severe fixed inferior and inferolateral perfusion defect. There is no ischemia.  LV Ejection Fraction: 26%. LV Wall Motion: Inferior and inferolateral akinesis. The remainder of the LV appears hypokinetic.  Marca Ancona  01/25/2013   Assessment / Plan: 1. Ischemic CM - EF of 30% - trying to manage medically. On high dose beta blocker. Low dose hydralazine added at last visit. He is not really wanting to use nitrates since he does take drugs for ED which would be a contraindication. I have explained to him that this is not the best option. Norvasc is stopped today. Hydralazine is increased.   2. Dyspnea - hard to say but probably multifactorial - seems  like Dr. Sherene Sires has felt this is more deconditioning/obesity related - may need CPX in the future to further define.   3. CKD - tells me that he only has one functioning kidney ?congenital - will recheck labs today.   Patient is agreeable to this plan and will call if any problems develop in the interim.   Rosalio Macadamia, RN, ANP-C [facility] [address] [place], [place]  [address]

## 2013-05-18 NOTE — Patient Instructions (Addendum)
Stop the Norvasc  Increase the Hydralazine to a whole tablet three times a day  We will check labs today  I will see you in a month  Call the [facility] office at [phone] if you have any questions, problems or concerns.

## 2013-05-21 NOTE — Assessment & Plan Note (Addendum)
Logan Burton continues to have progressive systolic CHF.  Dr. Graciela Husbands has seen him for consideration of ICD and thought that he needed additional medications before we could consider him for ICD.   He has stage 3 CKD - Creatinine is around 1.8.  K is normal.   I do not want to try Aldactone given the risk of hyperkalemia.  He is also not a candidate for ACE-inhibitor or ARB because of his renal dysfunction.    Will start to transition from amlodipine to hydralazine.  Will decrease his amlodipine to 1/2 current dose and start Hydralizine.  Will see him back in several weeks and DC amlodipine and increase hydralazine as tolerated.

## 2013-06-15 ENCOUNTER — Encounter (INDEPENDENT_AMBULATORY_CARE_PROVIDER_SITE_OTHER): Payer: BC Managed Care – PPO

## 2013-06-15 ENCOUNTER — Encounter: Payer: Self-pay | Admitting: Nurse Practitioner

## 2013-06-15 ENCOUNTER — Ambulatory Visit (INDEPENDENT_AMBULATORY_CARE_PROVIDER_SITE_OTHER): Payer: BC Managed Care – PPO | Admitting: Nurse Practitioner

## 2013-06-15 VITALS — BP 120/90 | HR 59 | Ht 72.0 in | Wt 194.1 lb

## 2013-06-15 DIAGNOSIS — I255 Ischemic cardiomyopathy: Secondary | ICD-10-CM

## 2013-06-15 DIAGNOSIS — R002 Palpitations: Secondary | ICD-10-CM

## 2013-06-15 DIAGNOSIS — I2589 Other forms of chronic ischemic heart disease: Secondary | ICD-10-CM

## 2013-06-15 NOTE — Progress Notes (Signed)
Logan Burton Date of Birth: 03/21/1956 Medical Record #[id number]  History of Present Illness: Logan Burton is seen back today for a 1 month check. Seen for Dr. Elease Hashimoto. 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 Reduced EF, HLD, left renal artery stenosis with past stenting and CKD - with only one functioning kidney. Last catheterization 2010 demonstrated occlusion of his circumflex graft, patency of his LIMA with a 50% stenosis at the anastomosis,RCA heavily diseased with a patent but small graft. Ejection fraction at that time was 35-40%   Has had his last echo in June - EF is 30 to 35% - Dr. Graciela Husbands wanted him on additional medical therapy before considering ICD. He has not been on ACE due to his CKD and being cautious about starting Aldactone as well.   Was started on Hydralazine and his Norvasc was cut back as well by Dr. Elease Hashimoto. No nitrates added. I then saw him a month ago. He was still short of breath - says it is no worse but no better. Weight is stable. No swelling. No chest pain. Little dizziness if stands up too quick. No syncope. Using Viagra for ED - not on his list and was not interested in taking nitrates. I stopped his Norvasc altogether and increased his Hydralazine.   Comes back today. Here alone. Remains short of breath. No worse but no better. Having more "skips" of his heart - this makes his anxious - happens mostly at night. Feels ok on his medicines but admits that it is hard for him to take hydralazine TID. Still not interested in taking nitrates due to therapy for ED. Not a candidiate for ACE/ARB or aldactone due to CKD and having only one kidney. Some dizziness at times. No swelling. Weight is stable.    Current Outpatient Prescriptions  Medication Sig Dispense Refill  . aspirin 325 MG tablet Take 325 mg by mouth daily.        Marland Kitchen atorvastatin (LIPITOR) 80 MG tablet TAKE 1 TABLET (80 MG TOTAL) BY MOUTH DAILY.  90 tablet  1  . buPROPion (WELLBUTRIN SR) 150 MG  12 hr tablet Take 150 mg by mouth daily.      Marland Kitchen escitalopram (LEXAPRO) 20 MG tablet Take 1 tablet by mouth daily.      . fenofibrate (TRICOR) 145 MG tablet Take 1 tab daily      . hydrALAZINE (APRESOLINE) 25 MG tablet Take 1 tablet (25 mg total) by mouth 3 (three) times daily.  270 tablet  3  . metoprolol (TOPROL-XL) 200 MG 24 hr tablet TAKE 1 TABLET BY MOUTH EVERY DAY  90 tablet  2  . Omega-3 Fatty Acids (FISH OIL) 600 MG CAPS Take 1 capsule by mouth daily.      . Vitamin D, Ergocalciferol, (DRISDOL) 50000 UNITS CAPS once a week.       No current facility-administered medications for this visit.    No Known Allergies  Past Medical History  Diagnosis Date  . Coronary artery disease   . 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 History of heart attack   . CHF (congestive heart failure)   . Chronic renal insufficiency   . PVD (peripheral vascular disease)   . Dyslipidemia   . Myocardial infarction     Past Surgical History  Procedure Laterality Date  . Coronary artery bypass graft    . Cervical fusion    . Hernia repair    .  Angioplasty      History  Smoking status  . Former Smoker -- 2.00 packs/day for 30 years  . Types: Cigarettes  . Quit date: 02/23/2006  Smokeless tobacco  . Never Used    History  Alcohol Use  . Yes    Comment: only rare social occasions    No family history on file.  Review of Systems: The review of systems is per the HPI.  All other systems were reviewed and are negative.  Physical Exam: BP 120/90  Pulse 59  Ht 6' (1.829 m)  Wt 194 lb 1.9 oz (88.052 kg)  BMI 26.32 kg/m2  SpO2 95% Patient is very pleasant and in no acute distress. Skin is warm and dry. Color is normal.  HEENT is unremarkable. Normocephalic/atraumatic. PERRL. Sclera are nonicteric. Neck is supple. No masses. No JVD. Lungs are clear. Cardiac exam shows a regular rate and rhythm. Abdomen is soft. Extremities are without edema. Gait and ROM are intact. No gross  neurologic deficits noted.  LABORATORY DATA: Lab Results  Component Value Date   WBC 6.1 08/27/2011   HGB 13.8 08/27/2011   HCT 40.6 08/27/2011   PLT 170 08/27/2011   GLUCOSE 102* 05/18/2013   CHOL 161 01/25/2013   TRIG 93.0 01/25/2013   HDL 37.00* 01/25/2013   LDLCALC 105* 01/25/2013   ALT 20 01/25/2013   AST 19 01/25/2013   NA 137 05/18/2013   K 4.4 05/18/2013   CL 108 05/18/2013   CREATININE 1.8* 05/18/2013   BUN 22 05/18/2013   CO2 24 05/18/2013   INR 1.12 08/27/2011   Echo Study Conclusions from June 2014   - Left ventricle: The cavity size was normal. Wall thickness was increased in a pattern of mild LVH. Systolic function was moderately to severely reduced. The estimated ejection fraction was in the range of 30% to 35%. There is akinesis of the inferolateral and posterior myocardium. Features are consistent with a pseudonormal left ventricular filling pattern, with concomitant abnormal relaxation and increased filling pressure (grade 2 diastolic dysfunction). - Left atrium: The atrium was mildly dilated. - Right ventricle: The cavity size was mildly dilated. Systolic function was mildly reduced.  Myoview Impression from June 2014  Exercise Capacity: Lexiscan with no exercise.  BP Response: Normal blood pressure response.  Clinical Symptoms: Short of breath, lightheaded.  ECG Impression: RBBB, no change with infusion.  Comparison with Prior Nuclear Study: No previous nuclear study performed  Overall Impression: High risk stress nuclear study with large, severe fixed inferior and inferolateral perfusion defect. There is no ischemia.  LV Ejection Fraction: 26%. LV Wall Motion: Inferior and inferolateral akinesis. The remainder of the LV appears hypokinetic.  Marca Ancona  01/25/2013   Assessment / Plan:  1. Ischemic CM - EF of 30% - trying to manage medically. On high dose beta blocker. Now on hydralazine as well. He is not really wanting to use nitrates since he does take drugs  for ED which would be a contraindication. Not a candidate for ACE/ARB or aldactone with CKD and only one functioning kidney. May need to consider referral to CHF clinic. Will get him back to talk with Dr. Graciela Husbands about ICD.   2. Dyspnea - hard to say but probably multifactorial - seems like Dr. Sherene Sires has felt this is more deconditioning/obesity related - but has decreased LV function as well. Will proceed with CPX in the future to further define.   3. CKD - not a candidate for ACE/ARB/aldactone.  4. Palpitations -  he is quite worried - happens every day - will place 24 hour Holter.   Patient is agreeable to this plan and will call if any problems develop in the interim.   Rosalio Macadamia, RN, ANP-C  [facility]  [address]  [place], [place] [address]

## 2013-06-15 NOTE — Patient Instructions (Signed)
We are going to place a heart monitor for 24 hours - this will look at your "skips"  We are going to arrange for a cardiopulmonary stress test - this will help tell us more about why you are short of breath  Stay on your current medicines  I am going to get you a visit again with Dr. Graciela Husbands  We need to think about going to the Heart failure clinic  See Dr. Elease Hashimoto in December as planned  Call the [facility] office at [phone] if you have any questions, problems or concerns.

## 2013-06-21 ENCOUNTER — Other Ambulatory Visit: Payer: Self-pay

## 2013-06-27 ENCOUNTER — Telehealth: Payer: Self-pay | Admitting: *Deleted

## 2013-06-27 NOTE — Telephone Encounter (Signed)
lmtcb for results of the 24 hr monitor, NSR/ no significant arrhythmias seen.

## 2013-06-27 NOTE — Telephone Encounter (Signed)
Follow Up:  Pt states he is calling for his results. Pt states it is ok to leave a voice mail with his results.

## 2013-06-27 NOTE — Telephone Encounter (Signed)
Spoke with pt/ he wonders if he still needs to have another appointment with Dr Graciela Husbands.  Review last note from [name] NP.  Please advise.

## 2013-06-28 NOTE — Telephone Encounter (Signed)
He appears to be fairly well medically optimized.  Please set up apt with Dr. Graciela Husbands

## 2013-06-28 NOTE — Telephone Encounter (Signed)
Before we schedule him for an apt with Dr  Graciela Husbands, we should get a repeat echo in January - 3 months after the last medication adjustment

## 2013-07-02 NOTE — Telephone Encounter (Signed)
Spoke with Dr Nahser/ pt will keep the already scheduled app and will order echo after 12/23 app with him and he is aware he has an app with dr Graciela Husbands 12/17. No changes to be made with the arrangements.

## 2013-07-05 ENCOUNTER — Ambulatory Visit (HOSPITAL_COMMUNITY): Payer: BC Managed Care – PPO | Attending: Cardiovascular Disease

## 2013-07-05 DIAGNOSIS — R0989 Other specified symptoms and signs involving the circulatory and respiratory systems: Secondary | ICD-10-CM | POA: Insufficient documentation

## 2013-07-05 DIAGNOSIS — I255 Ischemic cardiomyopathy: Secondary | ICD-10-CM

## 2013-07-05 DIAGNOSIS — I2589 Other forms of chronic ischemic heart disease: Secondary | ICD-10-CM

## 2013-07-05 DIAGNOSIS — R0609 Other forms of dyspnea: Secondary | ICD-10-CM | POA: Insufficient documentation

## 2013-07-17 ENCOUNTER — Other Ambulatory Visit: Payer: Self-pay | Admitting: Cardiovascular Disease

## 2013-07-17 ENCOUNTER — Telehealth: Payer: Self-pay | Admitting: Nurse Practitioner

## 2013-07-17 NOTE — Telephone Encounter (Signed)
New Problem: ° °Pt is requesting a call back with his test results.  °

## 2013-08-01 ENCOUNTER — Encounter: Payer: Self-pay | Admitting: Internal Medicine

## 2013-08-01 ENCOUNTER — Ambulatory Visit (INDEPENDENT_AMBULATORY_CARE_PROVIDER_SITE_OTHER): Payer: BC Managed Care – PPO | Admitting: Internal Medicine

## 2013-08-01 VITALS — BP 116/70 | HR 60 | Ht 72.0 in | Wt 192.0 lb

## 2013-08-01 DIAGNOSIS — I509 Heart failure, unspecified: Secondary | ICD-10-CM

## 2013-08-01 DIAGNOSIS — I255 Ischemic cardiomyopathy: Secondary | ICD-10-CM

## 2013-08-01 DIAGNOSIS — I2589 Other forms of chronic ischemic heart disease: Secondary | ICD-10-CM

## 2013-08-01 DIAGNOSIS — I5022 Chronic systolic (congestive) heart failure: Secondary | ICD-10-CM

## 2013-08-01 DIAGNOSIS — N183 Chronic kidney disease, stage 3 unspecified: Secondary | ICD-10-CM

## 2013-08-01 NOTE — Assessment & Plan Note (Addendum)
We have reviewed the role of an ICD in the potential benefits of CRT-D is a IIb indication this patient with class III heart failure and a QRS duration 134 with right bundle branch block.  I still wonder whether it be a candidate for ACE inhibitors and/or Aldactone. Recent recommendations for Aldactone are from GFR greater than 30. I will defer these decisions to heart failure consultation and will reassess left ventricular function after therapy is initiated. As noted previously he is disinclined 2 take nitrates  We'll arrange for him to be seen in heart failure clinic and following maximization of heart failure medications we can proceed with ICD implantation his left ventricular function remains depressed. At that juncture, CRT-D would also be appropriate

## 2013-08-01 NOTE — Patient Instructions (Signed)
Your physician recommends that you schedule a follow-up appointment in 12 weeks with Dr. Elease Hashimoto  We will set up new evaluation with Dr. Gala Romney with heart failure clinic. (see Dr Graciela Husbands after you see Dr. Gala Romney)  Your physician recommends that you schedule a follow-up appointment in: 8 weeks with Dr. Graciela Husbands .  Your physician recommends that you continue on your current medications as directed. Please refer to the Current Medication list given to you today.

## 2013-08-01 NOTE — Progress Notes (Signed)
      Patient Care Team: [name], MD as PCP - General (Family Medicine) [name], MD as Attending Physician (Family Medicine)   HPI  Logan Burton is a 57 y.o. male Seen in followup for consideration of an ICD. At that time he had an ischemic cardio myopathy and was on beta blockers. Other medications were discussed  He saw Dr. Jamse Mead it was his a  recommendation that he avoid ACE inhibitors and/or Aldactone. Hydralazine nitrates were chosen. Nitrates were not used because of his desire to use Viagra. He is referred back for consideration of an ICD.  Cardiopulmonary stress was also markedly abnormal  ECG July 14 demonstrated right bundle branch block with a QRS duration of 135 ms  He has class III symptoms, and short of breath with effort above normal exertion. There've been no edema. Past Medical History  Diagnosis Date  . Coronary artery disease   . 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 History of heart attack   . CHF (congestive heart failure)   . Chronic renal insufficiency   . PVD (peripheral vascular disease)   . Dyslipidemia   . Myocardial infarction     Past Surgical History  Procedure Laterality Date  . Coronary artery bypass graft    . Cervical fusion    . Hernia repair    . Angioplasty      Current Outpatient Prescriptions  Medication Sig Dispense Refill  . aspirin 325 MG tablet Take 325 mg by mouth daily.        Marland Kitchen atorvastatin (LIPITOR) 80 MG tablet TAKE 1 TABLET (80 MG TOTAL) BY MOUTH DAILY.  90 tablet  1  . buPROPion (WELLBUTRIN SR) 150 MG 12 hr tablet Take 150 mg by mouth daily.      Marland Kitchen escitalopram (LEXAPRO) 20 MG tablet Take 1 tablet by mouth daily.      . fenofibrate (TRICOR) 145 MG tablet Take 1 tab daily      . fenofibrate (TRICOR) 145 MG tablet TAKE 1 TABLET BY MOUTH EVERY DAY  90 tablet  0  . hydrALAZINE (APRESOLINE) 25 MG tablet Take 1 tablet (25 mg total) by mouth 3 (three) times daily.  270 tablet  3  . metoprolol  (TOPROL-XL) 200 MG 24 hr tablet TAKE 1 TABLET BY MOUTH EVERY DAY  90 tablet  2  . Omega-3 Fatty Acids (FISH OIL) 600 MG CAPS Take 1 capsule by mouth daily.      . Vitamin D, Ergocalciferol, (DRISDOL) 50000 UNITS CAPS once a week.       No current facility-administered medications for this visit.    No Known Allergies  Review of Systems negative except from HPI and PMH  Physical Exam BP 116/70  Pulse 60  Ht 6' (1.829 m)  Wt 192 lb (87.091 kg)  BMI 26.03 kg/m2 Well developed and well nourished in no acute distress HENT normal E scleral and icterus clear Neck Supple JVP f8 carotids brisk and full Clear to ausculation  Regular rate and rhythm, no murmurs gallops or rub Soft with active bowel sounds No clubbing cyanosis none Edema Alert and oriented, grossly normal motor and sensory function Skin Warm and Dry  ECG from July demonstrated sinus rhythm with right bundle branch block and a QRS of 134 Assessment and  Plan

## 2013-08-02 NOTE — Assessment & Plan Note (Signed)
As above  is modest renal insufficiency attenuate some benefit from an ICD I don't think his renal insufficiency is sufficient to preclude further afterload reduction

## 2013-08-07 ENCOUNTER — Ambulatory Visit: Payer: BC Managed Care – PPO | Admitting: Cardiovascular Disease

## 2013-08-24 ENCOUNTER — Encounter (HOSPITAL_COMMUNITY): Payer: Self-pay

## 2013-08-24 ENCOUNTER — Ambulatory Visit (HOSPITAL_COMMUNITY)
Admission: RE | Admit: 2013-08-24 | Discharge: 2013-08-24 | Disposition: A | Discharge status: Home / Self Care | Payer: BC Managed Care – PPO | Source: Ambulatory Visit | Attending: Internal Medicine | Admitting: Internal Medicine

## 2013-08-24 VITALS — BP 126/78 | HR 66 | Ht 72.0 in | Wt 196.1 lb

## 2013-08-24 DIAGNOSIS — Z87891 Personal history of nicotine dependence: Secondary | ICD-10-CM | POA: Insufficient documentation

## 2013-08-24 DIAGNOSIS — I4510 Unspecified right bundle-branch block: Secondary | ICD-10-CM | POA: Insufficient documentation

## 2013-08-24 DIAGNOSIS — I2510 Atherosclerotic heart disease of native coronary artery without angina pectoris: Secondary | ICD-10-CM | POA: Insufficient documentation

## 2013-08-24 DIAGNOSIS — I739 Peripheral vascular disease, unspecified: Secondary | ICD-10-CM

## 2013-08-24 DIAGNOSIS — I2589 Other forms of chronic ischemic heart disease: Secondary | ICD-10-CM | POA: Insufficient documentation

## 2013-08-24 DIAGNOSIS — Z79899 Other long term (current) drug therapy: Secondary | ICD-10-CM | POA: Insufficient documentation

## 2013-08-24 DIAGNOSIS — I509 Heart failure, unspecified: Secondary | ICD-10-CM

## 2013-08-24 DIAGNOSIS — N189 Chronic kidney disease, unspecified: Secondary | ICD-10-CM

## 2013-08-24 DIAGNOSIS — I5022 Chronic systolic (congestive) heart failure: Secondary | ICD-10-CM

## 2013-08-24 DIAGNOSIS — E785 Hyperlipidemia, unspecified: Secondary | ICD-10-CM | POA: Insufficient documentation

## 2013-08-24 DIAGNOSIS — I252 Old myocardial infarction: Secondary | ICD-10-CM | POA: Insufficient documentation

## 2013-08-24 DIAGNOSIS — N269 Renal sclerosis, unspecified: Secondary | ICD-10-CM | POA: Insufficient documentation

## 2013-08-24 DIAGNOSIS — Z951 Presence of aortocoronary bypass graft: Secondary | ICD-10-CM | POA: Insufficient documentation

## 2013-08-24 DIAGNOSIS — Z7982 Long term (current) use of aspirin: Secondary | ICD-10-CM | POA: Insufficient documentation

## 2013-08-24 MED ORDER — ISOSORBIDE MONONITRATE ER 30 MG PO TB24: 30.0000 mg | ORAL_TABLET | Freq: Every day | ORAL | Status: DC

## 2013-08-24 MED ORDER — DIGOXIN 125 MCG PO TABS: 0.0625 mg | ORAL_TABLET | ORAL | Status: DC

## 2013-08-24 NOTE — Patient Instructions (Addendum)
Follow up in 3-4 months  Take Imdur 30 mg daily  Take digoxin 0.0625 mg every other day   Do the following things EVERYDAY: 1) Weigh yourself in the morning before breakfast. Write it down and keep it in a log. 2) Take your medicines as prescribed 3) Eat low salt foods-Limit salt (sodium) to 2000 mg per day.  4) Stay as active as you can everyday 5) Limit all fluids for the day to less than 2 liters

## 2013-08-24 NOTE — Progress Notes (Signed)
Patient ID: Logan Burton, male   DOB: 02-21-1956, 58 y.o.   MRN: [id number]  EP: Dr Caryl Comes Cardiology: Dr Acie Fredrickson Pulmonary : Dr Melvyn Novas  PCP: Dr Modena Morrow Vascular: Dr Scot Dock Nephrology: Dr Posey Pronto  Referring MD : Dr Caryl Comes  HPI: Mr Wasserman is a 58 year old with a history of ICM, S/P CABG [id number] , chronic systolic heart failure [id number] EF 30-35%, CRI creatinine baseline 2.0, L renal artery stenosis S/P stent 2013, atrophic  r kidney , RBBB, PAD, and hyperlipidemia referred to the HF clinic by Dr Caryl Comes. He has not been on ace/spiro due CKD. He has been on hydralazine but not nitrates due to viagra use.   Evaluated by Dr Caryl Comes 07/2013 for possible CRT-D and at that time additional medication titration was recommended.   Overall he feels like he has noticed increased fatigue and dyspnea over the last 6 months.  worse. Denies SOB at rest and when walking slowly. SOB and CP when walking briskly or exercising. Gets SOB at work due to hectic pace. Complains SOB with steps. Occasionally PND. + Orthopnea Sleep on 2 pillows. Weight at home 190-192 pounds. He does not take diuretics. Snores. He says he would like to try nitrates and will not take viagra. Works  full time.  6/12/ 2014  Nuclear Stress test - LVEF 26% Inferior and inferolateral akinesis with the remainder LV hypokinetic.   02/07/2009 LHC LAD 70% stenosis in the mid vessel and 50% stenosis at the insertion of the left internal mammary artery. Circumflex occluded.   PFTs 02/02/13 FEV1 2.58 (66%) ratio 70 with FV curve c/w obst  CPX 07/05/13  Peak VO2: 15.4 (48.2% predicted peak VO2) VE/VCO2 slope: 41.7 OUES: 1.51 Peak RER: 1.16  Labs 05/18/13 K 4.4 Creatinine 1.8  FH: No family members with cardiac history SH: Divorced.  Lives alone.  Quit smoking 2007. Does not drink alcohol. Works full time to sterilize surgical equipment   Review of Systems:     Cardiac Review of Systems: {Y] = yes [ ]  = no  Chest Pain [   Y ]  Resting SOB [   ] Exertional SOB   [Y  ]  Orthopnea [ Y ]   Pedal Edema [   ]    Palpitations [  ] Syncope  [  ]   Presyncope [   ]  General Review of Systems: [Y] = yes [  ]=no Constitional: recent weight change [  ]; anorexia [  ]; fatigue [  ]; nausea [  ]; night sweats [  ]; fever [  ]; or chills [  ];                                                                                                                                          Dental: poor dentition[  ]; Last Dentist visit:   Eye : blurred vision [  ];  diplopia [   ]; vision changes [  ];  Amaurosis fugax[  ]; Resp: cough [  ];  wheezing[  ];  hemoptysis[  ]; shortness of breath[  ]; paroxysmal nocturnal dyspnea[ Y ]; dyspnea on exertion[  Y]; or orthopnea[ Y ];  GI:  gallstones[  ], vomiting[  ];  dysphagia[  ]; melena[  ];  hematochezia [  ]; heartburn[  ];   Hx of  Colonoscopy[  ]; GU: kidney stones [  ]; hematuria[  ];   dysuria [  ];  nocturia[  ];  history of     obstruction [  ];                 Skin: rash, swelling[  ];, hair loss[  ];  peripheral edema[  ];  or itching[  ]; Musculosketetal: myalgias[  ];  joint swelling[  ];  joint erythema[  ];  joint pain[  ];  back pain[  ];  Heme/Lymph: bruising[  ];  bleeding[  ];  anemia[  ];  Neuro: TIA[  ];  headaches[  ];  stroke[  ];  vertigo[  ];  seizures[  ];   paresthesias[  ];  difficulty walking[  ];  Psych:depression[  ]; anxiety[  ];  Endocrine: diabetes[  ];  thyroid dysfunction[  ];  Immunizations: Flu Jazmín.Cullens  ]; Pneumococcal[ Y ];  Other:    Past Medical History  Diagnosis Date  . Coronary artery disease   . 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 History of heart attack   . CHF (congestive heart failure)   . Chronic renal insufficiency   . PVD (peripheral vascular disease)   . Dyslipidemia   . Myocardial infarction     Current Outpatient Prescriptions  Medication Sig Dispense Refill  . aspirin 325 MG tablet Take 325 mg by mouth daily.        Marland Kitchen atorvastatin (LIPITOR) 80 MG  tablet TAKE 1 TABLET (80 MG TOTAL) BY MOUTH DAILY.  90 tablet  1  . buPROPion (WELLBUTRIN SR) 150 MG 12 hr tablet Take 150 mg by mouth daily.      Marland Kitchen escitalopram (LEXAPRO) 20 MG tablet Take 1 tablet by mouth daily.      . fenofibrate (TRICOR) 145 MG tablet TAKE 1 TABLET BY MOUTH EVERY DAY  90 tablet  0  . hydrALAZINE (APRESOLINE) 25 MG tablet Take 1 tablet (25 mg total) by mouth 3 (three) times daily.  270 tablet  3  . metoprolol (TOPROL-XL) 200 MG 24 hr tablet TAKE 1 TABLET BY MOUTH EVERY DAY  90 tablet  2  . Omega-3 Fatty Acids (FISH OIL) 600 MG CAPS Take 1 capsule by mouth daily.      . Vitamin D, Ergocalciferol, (DRISDOL) 50000 UNITS CAPS once a week.       No current facility-administered medications for this encounter.     No Known Allergies  History   Social History  . Marital Status: Divorced    Spouse Name: N/A    Number of Children: N/A  . Years of Education: N/A   Occupational History  . [employer]    Social History Main Topics  . Smoking status: Former Smoker -- 2.00 packs/day for 30 years    Types: Cigarettes    Quit date: 02/23/2006  . Smokeless tobacco: Never Used  . Alcohol Use: Yes     Comment: only rare social occasions  . Drug Use: No  .  Sexual Activity: Not Currently   Other Topics Concern  . Not on file   Social History Narrative  . No narrative on file    No family history on file.  PHYSICAL EXAM: Filed Vitals:   08/24/13 1002  BP: 126/78  Pulse: 66   General:  Well appearing. No respiratory difficulty HEENT: normal Neck: supple. no JVD. Carotids 2+ bilat; no bruits. No lymphadenopathy or thryomegaly appreciated. Cor: PMI nondisplaced. Regular rate & rhythm. No rubs, gallops or murmurs. Lungs: clear Abdomen: soft, nontender, nondistended. No hepatosplenomegaly. No bruits or masses. Good bowel sounds. Extremities: no cyanosis, clubbing, rash, edema Neuro: alert & oriented x 3, cranial nerves grossly intact. moves all 4 extremities  w/o difficulty. Affect pleasant.    No results found for this or any previous visit (from the past 24 hour(s)). No results found.   ASSESSMENT & PLAN:  1. Chronic Systolic Heart Failure ICM, 01/2013 ECHO EF 30-35%  NYHA II-III. SOB walking briskly. Volume status stable.  Continue Toprol XL 200 mg daily . Continue hydralazine 25 mg tid. Add Imdur 30 mg daily . He understands he can not use viagra  Will not add ace or spiro due to CKD. L RAS--> S/P stent 2013 .  Add digoxin 0. 0625 mg every other day. Follow dig level closely. Check Dig level in 2 weeks  CPX results dicussed. Mod--Severel limitation due to HF.  Will need RHC to further assess hemodynamics at some point.   Briefly discussed advanced therapies- to include heart transplant, LVAD and inotropes.   Encouraged to start exercising to improve endurance.  2. RBBB  3. CKD creatinine baseline 2.0 - no ace, spiro 4. L renal aretery stenosis S/P stent 2013. Atrophic R kidney.  Follow up in 3 months    CLEGG,AMY NP-C  11:27 AM   Patient seen and examined with Darrick Grinder, NP. We discussed all aspects of the encounter. I agree with the assessment and plan as stated above. i have reviewed Mr. Gabbard situation with him at length. Currently NYHA III. CPX test shows moderate to severe HF limitation but does not yet meet criteria for advanced therapies. We discussed advanced therapies at length and I told him that he would likely not be a candidate for heart transplant due to severe PAD and renal failure however he might be VAD candidate down the road if renal function is stable.  We will follow him closely in HF clinic. Encouraged him to start exercise program. We also discussed applying for disability if he can no longer keep up with his job.  Will start digoxin cautiously. No ACE/ARB due to solitary kidney. Titrate hydralazine/nitrates as tolerated.   Benay Spice 6:32 PM

## 2013-08-29 ENCOUNTER — Telehealth: Payer: Self-pay | Admitting: *Deleted

## 2013-08-29 DIAGNOSIS — R911 Solitary pulmonary nodule: Secondary | ICD-10-CM

## 2013-08-29 NOTE — Telephone Encounter (Signed)
Message copied by Rosana Berger on Wed Aug 29, 2013  9:37 AM ------      Message from: Christinia Gully B      Created: Fri Apr 13, 2013  4:35 PM       Be sure he has f/u cxr this month re LLL nodule ------

## 2013-08-29 NOTE — Telephone Encounter (Signed)
LMTCB

## 2013-08-30 NOTE — Telephone Encounter (Signed)
lmomtcb x 2  

## 2013-08-30 NOTE — Telephone Encounter (Signed)
Pt called back and he is aware that he will need to come in for the cxr this month.  Will forward back to leslie to make her aware that the pt did call back.

## 2013-09-05 ENCOUNTER — Telehealth (HOSPITAL_COMMUNITY): Payer: Self-pay | Admitting: *Deleted

## 2013-09-05 ENCOUNTER — Encounter (HOSPITAL_COMMUNITY): Payer: Self-pay | Admitting: *Deleted

## 2013-09-05 NOTE — Telephone Encounter (Signed)
Pt called c/o increased fatigue since last OV, as discussed then he feels like he needs disability that work is getting to much for him and he would like a note to get out of work starting on Mon 1/26, discussed w/Amy Clegg, NP she is ok with this and recommends f/u appt.  Pt is aware and agreeable, appt sch for 2/4, letter completed and mailed to pt

## 2013-09-07 ENCOUNTER — Ambulatory Visit (INDEPENDENT_AMBULATORY_CARE_PROVIDER_SITE_OTHER): Payer: BC Managed Care – PPO | Admitting: *Deleted

## 2013-09-07 ENCOUNTER — Ambulatory Visit (INDEPENDENT_AMBULATORY_CARE_PROVIDER_SITE_OTHER)
Admission: RE | Admit: 2013-09-07 | Discharge: 2013-09-07 | Disposition: A | Discharge status: Home / Self Care | Payer: BC Managed Care – PPO | Source: Ambulatory Visit | Attending: Internal Medicine | Admitting: Internal Medicine

## 2013-09-07 DIAGNOSIS — I10 Essential (primary) hypertension: Secondary | ICD-10-CM

## 2013-09-07 DIAGNOSIS — I509 Heart failure, unspecified: Secondary | ICD-10-CM

## 2013-09-07 DIAGNOSIS — I5022 Chronic systolic (congestive) heart failure: Secondary | ICD-10-CM

## 2013-09-07 DIAGNOSIS — R911 Solitary pulmonary nodule: Secondary | ICD-10-CM

## 2013-09-07 DIAGNOSIS — N289 Disorder of kidney and ureter, unspecified: Secondary | ICD-10-CM

## 2013-09-07 LAB — BASIC METABOLIC PANEL
BUN: 25 mg/dL — ABNORMAL HIGH (ref 6–23)
CALCIUM: 9.2 mg/dL (ref 8.4–10.5)
CHLORIDE: 109 meq/L (ref 96–112)
CO2: 23 meq/L (ref 19–32)
CREATININE: 2 mg/dL — AB (ref 0.4–1.5)
GFR: 36.66 mL/min — ABNORMAL LOW (ref >60.00)
Glucose, Bld: 119 mg/dL — ABNORMAL HIGH (ref 70–99)
Potassium: 4.2 mEq/L (ref 3.5–5.1)
Sodium: 139 mEq/L (ref 135–145)

## 2013-09-08 LAB — DIGOXIN LEVEL: DIGOXIN LVL: 0.3 ng/mL — AB (ref 0.8–2.0)

## 2013-09-10 NOTE — Progress Notes (Signed)
Quick Note:  Spoke with pt and notified of results per Dr. Wert. Pt verbalized understanding and denied any questions.  ______ 

## 2013-09-14 ENCOUNTER — Ambulatory Visit: Payer: BC Managed Care – PPO | Admitting: Internal Medicine

## 2013-09-19 ENCOUNTER — Encounter (HOSPITAL_COMMUNITY): Payer: Self-pay

## 2013-09-19 ENCOUNTER — Encounter (HOSPITAL_COMMUNITY): Payer: Self-pay | Admitting: Cardiology

## 2013-09-19 ENCOUNTER — Other Ambulatory Visit (HOSPITAL_COMMUNITY): Payer: Self-pay | Admitting: Adult Health

## 2013-09-19 ENCOUNTER — Ambulatory Visit (HOSPITAL_COMMUNITY)
Admission: RE | Admit: 2013-09-19 | Discharge: 2013-09-19 | Disposition: A | Discharge status: Home / Self Care | Payer: BC Managed Care – PPO | Source: Ambulatory Visit | Attending: Internal Medicine | Admitting: Internal Medicine

## 2013-09-19 VITALS — BP 130/64 | HR 65 | Resp 16 | Wt 194.0 lb

## 2013-09-19 DIAGNOSIS — Z79899 Other long term (current) drug therapy: Secondary | ICD-10-CM | POA: Insufficient documentation

## 2013-09-19 DIAGNOSIS — I5022 Chronic systolic (congestive) heart failure: Secondary | ICD-10-CM

## 2013-09-19 DIAGNOSIS — I2510 Atherosclerotic heart disease of native coronary artery without angina pectoris: Secondary | ICD-10-CM | POA: Insufficient documentation

## 2013-09-19 DIAGNOSIS — N189 Chronic kidney disease, unspecified: Secondary | ICD-10-CM | POA: Insufficient documentation

## 2013-09-19 DIAGNOSIS — I509 Heart failure, unspecified: Secondary | ICD-10-CM | POA: Insufficient documentation

## 2013-09-19 DIAGNOSIS — I774 Celiac artery compression syndrome: Secondary | ICD-10-CM | POA: Insufficient documentation

## 2013-09-19 DIAGNOSIS — I4510 Unspecified right bundle-branch block: Secondary | ICD-10-CM | POA: Insufficient documentation

## 2013-09-19 DIAGNOSIS — Z951 Presence of aortocoronary bypass graft: Secondary | ICD-10-CM | POA: Insufficient documentation

## 2013-09-19 DIAGNOSIS — Z7982 Long term (current) use of aspirin: Secondary | ICD-10-CM | POA: Insufficient documentation

## 2013-09-19 DIAGNOSIS — Z87891 Personal history of nicotine dependence: Secondary | ICD-10-CM | POA: Insufficient documentation

## 2013-09-19 DIAGNOSIS — N269 Renal sclerosis, unspecified: Secondary | ICD-10-CM | POA: Insufficient documentation

## 2013-09-19 DIAGNOSIS — E785 Hyperlipidemia, unspecified: Secondary | ICD-10-CM | POA: Insufficient documentation

## 2013-09-19 DIAGNOSIS — I252 Old myocardial infarction: Secondary | ICD-10-CM | POA: Insufficient documentation

## 2013-09-19 DIAGNOSIS — I739 Peripheral vascular disease, unspecified: Secondary | ICD-10-CM | POA: Insufficient documentation

## 2013-09-19 LAB — CBC WITH DIFFERENTIAL/PLATELET
Basophils Absolute: 0 10*3/uL (ref 0.0–0.1)
Basophils Relative: 0 % (ref 0–1)
Eosinophils Absolute: 0.3 10*3/uL (ref 0.0–0.7)
Eosinophils Relative: 3 % (ref 0–5)
HCT: 45.4 % (ref 39.0–52.0)
Hemoglobin: 15.5 g/dL (ref 13.0–17.0)
LYMPHS PCT: 25 % (ref 12–46)
Lymphs Abs: 2.2 10*3/uL (ref 0.7–4.0)
MCH: 29.8 pg (ref 26.0–34.0)
MCHC: 34.1 g/dL (ref 30.0–36.0)
MCV: 87.1 fL (ref 78.0–100.0)
MONOS PCT: 11 % (ref 3–12)
Monocytes Absolute: 0.9 10*3/uL (ref 0.1–1.0)
NEUTROS PCT: 61 % (ref 43–77)
Neutro Abs: 5.3 10*3/uL (ref 1.7–7.7)
PLATELETS: 206 10*3/uL (ref 150–400)
RBC: 5.21 MIL/uL (ref 4.22–5.81)
RDW: 14.6 % (ref 11.5–15.5)
WBC: 8.8 10*3/uL (ref 4.0–10.5)

## 2013-09-19 LAB — PROTIME-INR
INR: 1.01 (ref 0.00–1.49)
PROTHROMBIN TIME: 13.1 s (ref 11.6–15.2)

## 2013-09-19 MED ORDER — DIGOXIN 125 MCG PO TABS: 0.0625 mg | ORAL_TABLET | ORAL | Status: DC

## 2013-09-19 MED ORDER — ISOSORBIDE MONONITRATE ER 30 MG PO TB24: 30.0000 mg | ORAL_TABLET | Freq: Every day | ORAL | Status: DC

## 2013-09-19 NOTE — Patient Instructions (Signed)
Pt scheduled for [place] 09/20/13 cpt 93453 icd9 428.0 With pts current insurance: [employer] no pre cert is required ref #[id number]

## 2013-09-19 NOTE — Patient Instructions (Signed)
        Do the following things EVERYDAY: 1) Weigh yourself in the morning before breakfast. Write it down and keep it in a log. 2) Take your medicines as prescribed 3) Eat low salt foods-Limit salt (sodium) to 2000 mg per day.  4) Stay as active as you can everyday 5) Limit all fluids for the day to less than 2 liters

## 2013-09-19 NOTE — Progress Notes (Signed)
Patient ID: Logan Burton, male   DOB: 08/23/1955, 57 y.o.   MRN: [id number]  EP: Dr Klein Cardiology: Dr Nahser Pulmonary : Dr Wert  PCP: Dr Spear Vascular: Dr Dickson Nephrology: Dr Patel  Referring MD : Dr Klein  HPI: Logan Burton is a 57 year old with a history of ICM, S/P CABG 2007 , chronic systolic heart failure 01/2013 EF 30-35%, CRI creatinine baseline 2.0, L renal artery stenosis S/P stent 2013, atrophic  r kidney , RBBB, PAD, and hyperlipidemia referred to the HF clinic by Dr Klein. He has not been on ace/spiro due CKD. He has been on hydralazine but not nitrates due to viagra use.   Evaluated by Dr Klein 07/2013 for possible CRT-D and at that time additional medication titration was recommended.   He returns for an acute work indue to increased fatigue. Increased fatigued with exertion. He stopped working about 1 week ago. SOB after walking 50 feet. + PND. Denies CP. Compliant with medications. Weight at home 192 pounds.   6/12/ 2014  Nuclear Stress test - LVEF 26% Inferior and inferolateral akinesis with the remainder LV hypokinetic.   02/07/2009 LHC LAD 70% stenosis in the mid vessel and 50% stenosis at the insertion of the left internal mammary artery. Circumflex occluded.   PFTs 02/02/13 FEV1 2.58 (66%) ratio 70 with FV curve c/w obst  CPX 07/05/13  Peak VO2: 15.4 (48.2% predicted peak VO2) VE/VCO2 slope: 41.7 OUES: 1.51 Peak RER: 1.16  Labs 05/18/13 K 4.4 Creatinine 1.8 Labs 09/07/13 K 4.2 Creatinine 2.0 Dig level 0.3   FH: No family members with cardiac history SH: Divorced.  Lives alone.  Quit smoking 2007. Does not drink alcohol. Works full time to sterilize surgical equipment     Past Medical History  Diagnosis Date  . Coronary artery disease   . 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 History of heart attack   . CHF (congestive heart failure)   . Chronic renal insufficiency   . PVD (peripheral vascular disease)   . Dyslipidemia   . Myocardial  infarction     Current Outpatient Prescriptions  Medication Sig Dispense Refill  . aspirin 325 MG tablet Take 325 mg by mouth daily.        . atorvastatin (LIPITOR) 80 MG tablet TAKE 1 TABLET (80 MG TOTAL) BY MOUTH DAILY.  90 tablet  1  . buPROPion (WELLBUTRIN SR) 150 MG 12 hr tablet Take 150 mg by mouth daily.      . digoxin (LANOXIN) 0.125 MG tablet Take 0.5 tablets (0.0625 mg total) by mouth every other day.  15 tablet  6  . escitalopram (LEXAPRO) 20 MG tablet Take 1 tablet by mouth daily.      . fenofibrate (TRICOR) 145 MG tablet TAKE 1 TABLET BY MOUTH EVERY DAY  90 tablet  0  . hydrALAZINE (APRESOLINE) 25 MG tablet Take 1 tablet (25 mg total) by mouth 3 (three) times daily.  270 tablet  3  . isosorbide mononitrate (IMDUR) 30 MG 24 hr tablet Take 1 tablet (30 mg total) by mouth daily.  30 tablet  6  . metoprolol (TOPROL-XL) 200 MG 24 hr tablet TAKE 1 TABLET BY MOUTH EVERY DAY  90 tablet  2  . Omega-3 Fatty Acids (FISH OIL) 600 MG CAPS Take 1 capsule by mouth daily.      . Vitamin D, Ergocalciferol, (DRISDOL) 50000 UNITS CAPS once a week.       No current   facility-administered medications for this encounter.     No Known Allergies  History   Social History  . Marital Status: Divorced    Spouse Name: N/A    Number of Children: N/A  . Years of Education: N/A   Occupational History  . [employer]    Social History Main Topics  . Smoking status: Former Smoker -- 2.00 packs/day for 30 years    Types: Cigarettes    Quit date: 02/23/2006  . Smokeless tobacco: Never Used  . Alcohol Use: Yes     Comment: only rare social occasions  . Drug Use: No  . Sexual Activity: Not Currently   Other Topics Concern  . Not on file   Social History Narrative  . No narrative on file    No family history on file.  PHYSICAL EXAM: Filed Vitals:   09/19/13 1521  BP: 130/64  Pulse: 65  Resp: 16   General:  Well appearing. No respiratory difficulty Nephew present  HEENT:  normal Neck: supple. no JVD. Carotids 2+ bilat; no bruits. No lymphadenopathy or thryomegaly appreciated. Cor: PMI nondisplaced. Regular rate & rhythm. No rubs, gallops or murmurs. Lungs: clear Abdomen: soft, nontender, nondistended. No hepatosplenomegaly. No bruits or masses. Good bowel sounds. Extremities: no cyanosis, clubbing, rash, edema Neuro: alert & oriented x 3, cranial nerves grossly intact. moves all 4 extremities w/o difficulty. Affect pleasant.    No results found for this or any previous visit (from the past 24 hour(s)). No results found.   ASSESSMENT & PLAN:  1. Chronic Systolic Heart Failure ICM, 01/2013 ECHO EF 30-35%  NYHA IIIb. Functional decline over the last few weeks.  Volume status stable.  Continue Toprol XL 200 mg daily . Continue hydralazine 25 mg tid. Add Imdur 30 mg daily . He understands he can not use viagra  Will not add ace or spiro due to CKD. L RAS--> S/P stent 2013 .  Add digoxin 0. 0625 mg every other day. Follow dig level closely. Check Dig level in 2 weeks  CPX reviewed 2014.  Mod--Severel limitation due to HF. Will need repeat at some point.  Will schedule RHC tomorrow to evaluate hemodynamics at some point.  May require Milrinone if low output HF noted.    Encouraged to start exercising to improve endurance.  Check ECHO  2. RBBB  3. CKD creatinine baseline 2.0 - no ace, spiro 4. L renal aretery stenosis S/P stent 2013. Atrophic R kidney.  Follow up 1 month.  CLEGG,AMY NP-C  3:38 PM   Patient seen and examined with Logan Grinder, NP. We discussed all aspects of the encounter. I agree with the assessment and plan as stated above. He has progressive HF symptoms now NYHA III-IIIB with worsening renal function. CPX borderline for advanced therapies. Agree with plan for RHC to assess for low output and need for inotropic support particularly to support renal function. Discussed with patient and his nephew, Logan Burton.   Logan Rogoff,MD 12:21 PM

## 2013-09-20 ENCOUNTER — Ambulatory Visit (HOSPITAL_COMMUNITY)
Admission: RE | Admit: 2013-09-20 | Discharge: 2013-09-20 | Disposition: A | Discharge status: Home / Self Care | Payer: BC Managed Care – PPO | Source: Ambulatory Visit | Attending: Internal Medicine | Admitting: Internal Medicine

## 2013-09-20 ENCOUNTER — Encounter (HOSPITAL_COMMUNITY)
Admission: RE | Disposition: A | Discharge status: Home / Self Care | Payer: BC Managed Care – PPO | Source: Ambulatory Visit | Attending: Internal Medicine

## 2013-09-20 ENCOUNTER — Telehealth: Payer: Self-pay | Admitting: Cardiovascular Disease

## 2013-09-20 DIAGNOSIS — Z951 Presence of aortocoronary bypass graft: Secondary | ICD-10-CM | POA: Insufficient documentation

## 2013-09-20 DIAGNOSIS — Z87891 Personal history of nicotine dependence: Secondary | ICD-10-CM | POA: Insufficient documentation

## 2013-09-20 DIAGNOSIS — I252 Old myocardial infarction: Secondary | ICD-10-CM | POA: Insufficient documentation

## 2013-09-20 DIAGNOSIS — I5022 Chronic systolic (congestive) heart failure: Secondary | ICD-10-CM | POA: Insufficient documentation

## 2013-09-20 DIAGNOSIS — I2510 Atherosclerotic heart disease of native coronary artery without angina pectoris: Secondary | ICD-10-CM | POA: Insufficient documentation

## 2013-09-20 DIAGNOSIS — I4510 Unspecified right bundle-branch block: Secondary | ICD-10-CM | POA: Insufficient documentation

## 2013-09-20 DIAGNOSIS — I509 Heart failure, unspecified: Secondary | ICD-10-CM | POA: Insufficient documentation

## 2013-09-20 DIAGNOSIS — E785 Hyperlipidemia, unspecified: Secondary | ICD-10-CM | POA: Insufficient documentation

## 2013-09-20 DIAGNOSIS — I739 Peripheral vascular disease, unspecified: Secondary | ICD-10-CM | POA: Insufficient documentation

## 2013-09-20 DIAGNOSIS — I701 Atherosclerosis of renal artery: Secondary | ICD-10-CM | POA: Insufficient documentation

## 2013-09-20 DIAGNOSIS — Z7982 Long term (current) use of aspirin: Secondary | ICD-10-CM | POA: Insufficient documentation

## 2013-09-20 DIAGNOSIS — Z79899 Other long term (current) drug therapy: Secondary | ICD-10-CM | POA: Insufficient documentation

## 2013-09-20 DIAGNOSIS — N189 Chronic kidney disease, unspecified: Secondary | ICD-10-CM | POA: Insufficient documentation

## 2013-09-20 DIAGNOSIS — I2589 Other forms of chronic ischemic heart disease: Secondary | ICD-10-CM | POA: Insufficient documentation

## 2013-09-20 HISTORY — PX: RIGHT HEART CATHETERIZATION: SHX5447

## 2013-09-20 LAB — POCT I-STAT 3, ART BLOOD GAS (G3+)
ACID-BASE DEFICIT: 4 mmol/L — AB (ref 0.0–2.0)
BICARBONATE: 20.3 meq/L (ref 20.0–24.0)
O2 Saturation: 92 %
PH ART: 7.389 (ref 7.350–7.450)
TCO2: 21 mmol/L (ref 0–100)
pCO2 arterial: 33.6 mmHg — ABNORMAL LOW (ref 35.0–45.0)
pO2, Arterial: 64 mmHg — ABNORMAL LOW (ref 80.0–100.0)

## 2013-09-20 LAB — POCT I-STAT 3, VENOUS BLOOD GAS (G3P V)
ACID-BASE DEFICIT: 4 mmol/L — AB (ref 0.0–2.0)
Acid-base deficit: 4 mmol/L — ABNORMAL HIGH (ref 0.0–2.0)
BICARBONATE: 20.9 meq/L (ref 20.0–24.0)
Bicarbonate: 21.2 mEq/L (ref 20.0–24.0)
O2 SAT: 62 %
O2 SAT: 64 %
TCO2: 22 mmol/L (ref 0–100)
TCO2: 22 mmol/L (ref 0–100)
pCO2, Ven: 37.2 mmHg — ABNORMAL LOW (ref 45.0–50.0)
pCO2, Ven: 36.9 mmHg — ABNORMAL LOW (ref 45.0–50.0)
pH, Ven: 7.357 — ABNORMAL HIGH (ref 7.250–7.300)
pH, Ven: 7.366 — ABNORMAL HIGH (ref 7.250–7.300)
pO2, Ven: 33 mmHg (ref 30.0–45.0)
pO2, Ven: 34 mmHg (ref 30.0–45.0)

## 2013-09-20 LAB — BASIC METABOLIC PANEL
BUN: 21 mg/dL (ref 6–23)
CHLORIDE: 106 meq/L (ref 96–112)
CO2: 23 mEq/L (ref 19–32)
Calcium: 9.3 mg/dL (ref 8.4–10.5)
Creatinine, Ser: 1.84 mg/dL — ABNORMAL HIGH (ref 0.50–1.35)
GFR calc non Af Amer: 38 mL/min — ABNORMAL LOW (ref >90)
GFR, EST AFRICAN AMERICAN: 45 mL/min — AB (ref >90)
GLUCOSE: 111 mg/dL — AB (ref 70–99)
Potassium: 5 mEq/L (ref 3.7–5.3)
Sodium: 141 mEq/L (ref 137–147)

## 2013-09-20 SURGERY — RIGHT HEART CATH
Anesthesia: LOCAL

## 2013-09-20 MED ORDER — LIDOCAINE HCL (PF) 1 % IJ SOLN
INTRAMUSCULAR | Status: AC
  Filled 2013-09-20: qty 30

## 2013-09-20 MED ORDER — ONDANSETRON HCL 4 MG/2ML IJ SOLN: 4.0000 mg | Freq: Four times a day (QID) | INTRAMUSCULAR | Status: DC | PRN

## 2013-09-20 MED ORDER — ACETAMINOPHEN 325 MG PO TABS: 650.0000 mg | ORAL_TABLET | ORAL | Status: DC | PRN

## 2013-09-20 MED ORDER — HEPARIN (PORCINE) IN NACL 2-0.9 UNIT/ML-% IJ SOLN
INTRAMUSCULAR | Status: AC
  Filled 2013-09-20: qty 1000

## 2013-09-20 MED ORDER — SODIUM CHLORIDE 0.9 % IJ SOLN: 3.0000 mL | Freq: Two times a day (BID) | INTRAMUSCULAR | Status: DC

## 2013-09-20 MED ORDER — MIDAZOLAM HCL 2 MG/2ML IJ SOLN
INTRAMUSCULAR | Status: AC
  Filled 2013-09-20: qty 2

## 2013-09-20 MED ORDER — ASPIRIN 81 MG PO CHEW
81.0000 mg | CHEWABLE_TABLET | ORAL | Status: AC
  Administered 2013-09-20: 81 mg via ORAL

## 2013-09-20 MED ORDER — SODIUM CHLORIDE 0.9 % IJ SOLN: 3.0000 mL | INTRAMUSCULAR | Status: DC | PRN

## 2013-09-20 MED ORDER — SODIUM CHLORIDE 0.9 % IV SOLN: 250.0000 mL | INTRAVENOUS | Status: DC | PRN

## 2013-09-20 MED ORDER — ASPIRIN 81 MG PO CHEW
CHEWABLE_TABLET | ORAL | Status: AC
  Administered 2013-09-20: 81 mg via ORAL
  Filled 2013-09-20: qty 1

## 2013-09-20 MED ORDER — FENTANYL CITRATE 0.05 MG/ML IJ SOLN
INTRAMUSCULAR | Status: AC
  Filled 2013-09-20: qty 2

## 2013-09-20 MED ORDER — SODIUM CHLORIDE 0.9 % IV SOLN
INTRAVENOUS | Status: DC
  Administered 2013-09-20: 10:00:00 via INTRAVENOUS

## 2013-09-20 NOTE — Progress Notes (Signed)
Pt received from cath procedure alert and denies any discomfort.  IJ dressing dry and intact

## 2013-09-20 NOTE — Progress Notes (Signed)
Received pt and noted a less than 1 cm hematoma over right IJ site.  Dr Haroldine Laws notified , and pressure held to are for 15 mins..  Area remains soft and supple, and swelling is resolved.  Dr Haroldine Laws will follow up with pt.

## 2013-09-20 NOTE — CV Procedure (Signed)
Cardiac Cath Procedure Note:  Indication:  Heart failure  Procedures performed:  1) Right heart catheterization  Description of procedure:   The risks and indication of the procedure were explained. Consent was signed and placed on the chart. An appropriate timeout was taken prior to the procedure.   The right neck was prepped and draped in the routine sterile fashion and anesthetized with 1% local lidocaine. The RIJ was identified by u/s but was small. We were unable to cannulate.  The right groin was prepped and draped in the routine sterile fashion and anesthetized with 1% local lidocaine.   A 7 FR venous sheath was placed in the right femoral vein using a modified Seldinger technique. A standard Swan-Ganz catheter was used for the procedure.   Complications: None apparent.  Findings:  RA = 3 RV = 26/0/5 PA = 26/8 (16) PCW = 7 Fick cardiac output/index = 4.5/2.2 Thermo CO/CI = 4.3/2.1 PVR = 1.0 WU FA sat = 92% PA sat = 64%, 66%  Assessment/Plan:  Essentially well compensated hemodynamics. Volume and cardiac output on low end. Can cut diuretics slightly as needed. No role for inotropes at this point.   Sufyaan Palma,MD 1:07 PM

## 2013-09-20 NOTE — Telephone Encounter (Signed)
Received request from Nurse fax box, documents faxed for surgical clearance. To: Dr.Lutkins Fax number: [phone]   Attention: [phone]/[id number]

## 2013-09-20 NOTE — Progress Notes (Signed)
Dr Haroldine Laws in to see pt, to evaluate the IF site and talk to pt about plan of care.

## 2013-09-20 NOTE — Discharge Instructions (Signed)
Angiography, Care After °Refer to this sheet in the next few weeks. These instructions provide you with information on caring for yourself after your procedure. Your health care provider may also give you more specific instructions. Your treatment has been planned according to current medical practices, but problems sometimes occur. Call your health care provider if you have any problems or questions after your procedure.  °WHAT TO EXPECT AFTER THE PROCEDURE °After your procedure, it is typical to have the following sensations: °· Minor discomfort or tenderness and a small bump at the catheter insertion site. The bump should usually decrease in size and tenderness within 1 to 2 weeks. °· Any bruising will usually fade within 2 to 4 weeks. °HOME CARE INSTRUCTIONS  °· You may need to keep taking blood thinners if they were prescribed for you. Only take over-the-counter or prescription medicines for pain, fever, or discomfort as directed by your health care provider. °· Do not apply powder or lotion to the site. °· Do not sit in a bathtub, swimming pool, or whirlpool for 5 to 7 days. °· You may shower 24 hours after the procedure. Remove the bandage (dressing) and gently wash the site with plain soap and water. Gently pat the site dry. °· Inspect the site at least twice daily. °· Limit your activity for the first 48 hours. Do not bend, squat, or lift anything over 20 lb (9 kg) or as directed by your health care provider. °· Do not drive home if you are discharged the day of the procedure. Have someone else drive you. Follow instructions about when you can drive or return to work. °SEEK MEDICAL CARE IF: °· You get lightheaded when standing up. °· You have drainage (other than a small amount of blood on the dressing). °· You have chills. °· You have a fever. °· You have redness, warmth, swelling, or pain at the insertion site. °SEEK IMMEDIATE MEDICAL CARE IF:  °· You develop chest pain or shortness of breath, feel faint,  or pass out. °· You have bleeding, swelling larger than a walnut, or drainage from the catheter insertion site. °· You develop pain, discoloration, coldness, or severe bruising in the leg or arm that held the catheter. °· You develop bleeding from any other place, such as the bowels. You may see bright red blood in your urine or stools, or your stools may appear black and tarry. °· You have heavy bleeding from the site. If this happens, hold pressure on the site. °MAKE SURE YOU: °· Understand these instructions. °· Will watch your condition. °· Will get help right away if you are not doing well or get worse. °Document Released: 02/18/2005 Document Revised: 04/04/2013 Document Reviewed: 12/25/2012 °[employer]® Patient Information ©2014 [employer]. ° °

## 2013-09-20 NOTE — H&P (View-Only) (Signed)
Patient ID: Logan Burton, male   DOB: 02/26/1956, 58 y.o.   MRN: [id number]  EP: Dr Caryl Comes Cardiology: Dr Acie Fredrickson Pulmonary : Dr Melvyn Novas  PCP: Dr Modena Morrow Vascular: Dr Scot Dock Nephrology: Dr Posey Pronto  Referring MD : Dr Caryl Comes  HPI: Mr Grape is a 58 year old with a history of ICM, S/P CABG 7829 , chronic systolic heart failure 12/6211 EF 30-35%, CRI creatinine baseline 2.0, L renal artery stenosis S/P stent 2013, atrophic  r kidney , RBBB, PAD, and hyperlipidemia referred to the HF clinic by Dr Caryl Comes. He has not been on ace/spiro due CKD. He has been on hydralazine but not nitrates due to viagra use.   Evaluated by Dr Caryl Comes 07/2013 for possible CRT-D and at that time additional medication titration was recommended.   He returns for an acute work indue to increased fatigue. Increased fatigued with exertion. He stopped working about 1 week ago. SOB after walking 50 feet. + PND. Denies CP. Compliant with medications. Weight at home 192 pounds.   6/12/ 2014  Nuclear Stress test - LVEF 26% Inferior and inferolateral akinesis with the remainder LV hypokinetic.   02/07/2009 LHC LAD 70% stenosis in the mid vessel and 50% stenosis at the insertion of the left internal mammary artery. Circumflex occluded.   PFTs 02/02/13 FEV1 2.58 (66%) ratio 70 with FV curve c/w obst  CPX 07/05/13  Peak VO2: 15.4 (48.2% predicted peak VO2) VE/VCO2 slope: 41.7 OUES: 1.51 Peak RER: 1.16  Labs 05/18/13 K 4.4 Creatinine 1.8 Labs 09/07/13 K 4.2 Creatinine 2.0 Dig level 0.3   FH: No family members with cardiac history SH: Divorced.  Lives alone.  Quit smoking 2007. Does not drink alcohol. Works full time to [employer] trader     Past Medical History  Diagnosis Date  . Coronary artery disease   . 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 History of heart attack   . CHF (congestive heart failure)   . Chronic renal insufficiency   . PVD (peripheral vascular disease)   . Dyslipidemia   . Myocardial  infarction     Current Outpatient Prescriptions  Medication Sig Dispense Refill  . aspirin 325 MG tablet Take 325 mg by mouth daily.        Marland Kitchen atorvastatin (LIPITOR) 80 MG tablet TAKE 1 TABLET (80 MG TOTAL) BY MOUTH DAILY.  90 tablet  1  . buPROPion (WELLBUTRIN SR) 150 MG 12 hr tablet Take 150 mg by mouth daily.      . digoxin (LANOXIN) 0.125 MG tablet Take 0.5 tablets (0.0625 mg total) by mouth every other day.  15 tablet  6  . escitalopram (LEXAPRO) 20 MG tablet Take 1 tablet by mouth daily.      . fenofibrate (TRICOR) 145 MG tablet TAKE 1 TABLET BY MOUTH EVERY DAY  90 tablet  0  . hydrALAZINE (APRESOLINE) 25 MG tablet Take 1 tablet (25 mg total) by mouth 3 (three) times daily.  270 tablet  3  . isosorbide mononitrate (IMDUR) 30 MG 24 hr tablet Take 1 tablet (30 mg total) by mouth daily.  30 tablet  6  . metoprolol (TOPROL-XL) 200 MG 24 hr tablet TAKE 1 TABLET BY MOUTH EVERY DAY  90 tablet  2  . Omega-3 Fatty Acids (FISH OIL) 600 MG CAPS Take 1 capsule by mouth daily.      . Vitamin D, Ergocalciferol, (DRISDOL) 50000 UNITS CAPS once a week.       No current  facility-administered medications for this encounter.     No Known Allergies  History   Social History  . Marital Status: Divorced    Spouse Name: N/A    Number of Children: N/A  . Years of Education: N/A   Occupational History  . [employer]    Social History Main Topics  . Smoking status: Former Smoker -- 2.00 packs/day for 30 years    Types: Cigarettes    Quit date: 02/23/2006  . Smokeless tobacco: Never Used  . Alcohol Use: Yes     Comment: only rare social occasions  . Drug Use: No  . Sexual Activity: Not Currently   Other Topics Concern  . Not on file   Social History Narrative  . No narrative on file    No family history on file.  PHYSICAL EXAM: Filed Vitals:   09/19/13 1521  BP: 130/64  Pulse: 65  Resp: 16   General:  Well appearing. No respiratory difficulty Nephew present  HEENT:  normal Neck: supple. no JVD. Carotids 2+ bilat; no bruits. No lymphadenopathy or thryomegaly appreciated. Cor: PMI nondisplaced. Regular rate & rhythm. No rubs, gallops or murmurs. Lungs: clear Abdomen: soft, nontender, nondistended. No hepatosplenomegaly. No bruits or masses. Good bowel sounds. Extremities: no cyanosis, clubbing, rash, edema Neuro: alert & oriented x 3, cranial nerves grossly intact. moves all 4 extremities w/o difficulty. Affect pleasant.    No results found for this or any previous visit (from the past 24 hour(s)). No results found.   ASSESSMENT & PLAN:  1. Chronic Systolic Heart Failure ICM, 01/2013 ECHO EF 30-35%  NYHA IIIb. Functional decline over the last few weeks.  Volume status stable.  Continue Toprol XL 200 mg daily . Continue hydralazine 25 mg tid. Add Imdur 30 mg daily . He understands he can not use viagra  Will not add ace or spiro due to CKD. L RAS--> S/P stent 2013 .  Add digoxin 0. 0625 mg every other day. Follow dig level closely. Check Dig level in 2 weeks  CPX reviewed 2014.  Mod--Severel limitation due to HF. Will need repeat at some point.  Will schedule RHC tomorrow to evaluate hemodynamics at some point.  May require Milrinone if low output HF noted.    Encouraged to start exercising to improve endurance.  Check ECHO  2. RBBB  3. CKD creatinine baseline 2.0 - no ace, spiro 4. L renal aretery stenosis S/P stent 2013. Atrophic R kidney.  Follow up 1 month.  CLEGG,AMY NP-C  3:38 PM   Patient seen and examined with Darrick Grinder, NP. We discussed all aspects of the encounter. I agree with the assessment and plan as stated above. He has progressive HF symptoms now NYHA III-IIIB with worsening renal function. CPX borderline for advanced therapies. Agree with plan for RHC to assess for low output and need for inotropic support particularly to support renal function. Discussed with patient and his nephew, Shanon Brow.   Lively Haberman,MD 12:21 PM

## 2013-09-20 NOTE — Interval H&P Note (Signed)
History and Physical Interval Note:  09/20/2013 12:24 PM  Logan Burton  has presented today for surgery, with the diagnosis of Heart failure  The various methods of treatment have been discussed with the patient and family. After consideration of risks, benefits and other options for treatment, the patient has consented to  Procedure(s): RIGHT HEART CATH (N/A) as a surgical intervention .  The patient's history has been reviewed, patient examined, no change in status, stable for surgery.  I have reviewed the patient's chart and labs.  Questions were answered to the patient's satisfaction.     Daniel Bensimhon

## 2013-09-20 NOTE — Progress Notes (Signed)
Discharge instruction given to pt .  Pt able to verbalize understanding.  Pt to car via wheelchair.

## 2013-09-27 ENCOUNTER — Encounter: Payer: Self-pay | Admitting: *Deleted

## 2013-09-27 ENCOUNTER — Ambulatory Visit (INDEPENDENT_AMBULATORY_CARE_PROVIDER_SITE_OTHER): Payer: BC Managed Care – PPO | Admitting: Internal Medicine

## 2013-09-27 ENCOUNTER — Ambulatory Visit: Payer: BC Managed Care – PPO | Admitting: Internal Medicine

## 2013-09-27 ENCOUNTER — Encounter: Payer: Self-pay | Admitting: Internal Medicine

## 2013-09-27 VITALS — BP 119/71 | HR 66 | Ht 72.0 in

## 2013-09-27 DIAGNOSIS — I5022 Chronic systolic (congestive) heart failure: Secondary | ICD-10-CM

## 2013-09-27 DIAGNOSIS — I255 Ischemic cardiomyopathy: Secondary | ICD-10-CM

## 2013-09-27 DIAGNOSIS — I2589 Other forms of chronic ischemic heart disease: Secondary | ICD-10-CM

## 2013-09-27 NOTE — Patient Instructions (Signed)
Your physician has recommended that you have a pacemaker inserted. A pacemaker is a small device that is placed under the skin of your chest or abdomen to help control abnormal heart rhythms. This device uses electrical pulses to prompt the heart to beat at a normal rate. Pacemakers are used to treat heart rhythms that are too slow. Wire (leads) are attached to the pacemaker that goes into the chambers of you heart. This is done in the hospital and usually requires and overnight stay. Please see the instruction sheet given to you today for more information.  You are scheduled for 10/12/13, be at hospital at 11:00am. Lab work on 2/23   Your physician recommends that you schedule a follow-up appointment on March 9 for wound check.

## 2013-09-27 NOTE — Progress Notes (Signed)
Patient Care Team: Florina Ou, MD as PCP - General (Family Medicine) Florina Ou, MD as Attending Physician (Family Medicine)   HPI  Logan Burton is a 58 y.o. male Was seen in December for consideration of an ICD. He also right bundle branch block and a QRS rate of 134 ms. Consideration for defibrillator implantation was deferred until he had a chance to follow up with heart failure clinic.  Is a history of ischemic heart disease with prior bypass surgery and mild renal insufficiency.  His symptoms have worsened and his CPX is concerning. A right heart cath was undertaken suggesting modest intravascular volume depletion and well compensated hemodynamics.  His QRS duration most recently was 122 with right bundle branch block.  He also has complaints of dizziness. I wonder whether this is related to intravascular volume depletion  Past Medical History  Diagnosis Date  . Coronary artery disease   . 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 History of heart attack   . CHF (congestive heart failure)   . Chronic renal insufficiency   . PVD (peripheral vascular disease)   . Dyslipidemia   . Myocardial infarction     Past Surgical History  Procedure Laterality Date  . Coronary artery bypass graft    . Cervical fusion    . Hernia repair    . Angioplasty      Current Outpatient Prescriptions  Medication Sig Dispense Refill  . aspirin 325 MG tablet Take 325 mg by mouth daily.        Marland Kitchen atorvastatin (LIPITOR) 80 MG tablet TAKE 1 TABLET (80 MG TOTAL) BY MOUTH DAILY.  90 tablet  1  . buPROPion (WELLBUTRIN SR) 150 MG 12 hr tablet Take 150 mg by mouth daily.      . digoxin (LANOXIN) 0.125 MG tablet Take 0.5 tablets (0.0625 mg total) by mouth every other day.  45 tablet  3  . escitalopram (LEXAPRO) 20 MG tablet Take 1 tablet by mouth daily.      . fenofibrate (TRICOR) 145 MG tablet TAKE 1 TABLET BY MOUTH EVERY DAY  90 tablet  0  . hydrALAZINE (APRESOLINE) 25 MG  tablet Take 1 tablet (25 mg total) by mouth 3 (three) times daily.  270 tablet  3  . isosorbide mononitrate (IMDUR) 30 MG 24 hr tablet Take 1 tablet (30 mg total) by mouth daily.  90 tablet  3  . metoprolol (TOPROL-XL) 200 MG 24 hr tablet TAKE 1 TABLET BY MOUTH EVERY DAY  90 tablet  2  . Omega-3 Fatty Acids (FISH OIL) 600 MG CAPS Take 1 capsule by mouth daily.      . Vitamin D, Ergocalciferol, (DRISDOL) 50000 UNITS CAPS once a week.       No current facility-administered medications for this visit.    No Known Allergies  Review of Systems negative except from HPI and PMH  Physical Exam BP 119/71  Pulse 66  Ht 6' (1.829 m) Well developed and nourished in no acute distress HENT normal Neck supple with JVP-flat Clear Regular rate and rhythm, no murmurs or gallops Abd-soft with active BS No Clubbing cyanosis edema Skin-warm and dry A & Oriented  Grossly normal sensory and motor function   ECG from 25 demonstrated sinus rhythm at 68 intervals 15/12/42 Inferolateral MI   Assessment and  Plan  Ischemic cardiomyopathy  Class II congestive heart failure  Right bundle branch block QRS duration 122 ms  Renal insufficiency  The patient needs criteria for ICD implantation for primary prevention. He has persistent left ventricular dysfunction with class II congestive heart failure. His QRS is modestly brought to sufficiently so justify CRT especially given right bundle branch block configuration and modest congestive symptoms  Have reviewed the potential benefits and risks of ICD implantation including but not limited to death, perforation of heart or lung, lead dislodgement, infection,  device malfunction and inappropriate shocks.  The patient  express understanding  and are willing to proceed.     

## 2013-09-28 ENCOUNTER — Encounter: Payer: Self-pay | Admitting: *Deleted

## 2013-09-28 ENCOUNTER — Telehealth: Payer: Self-pay | Admitting: *Deleted

## 2013-09-28 NOTE — Telephone Encounter (Signed)
As per Dr. Caryl Comes I have called pt and rescheduled his ICD procedure from  10/12/13 to October 15, 2013 at 7:30am  Pt is agreeable with this change. I have mailed him a letter indicating the change in date for his procedure. Horton Chin RN

## 2013-10-03 ENCOUNTER — Other Ambulatory Visit (HOSPITAL_COMMUNITY): Payer: Self-pay | Admitting: Interventional Radiology

## 2013-10-03 DIAGNOSIS — I701 Atherosclerosis of renal artery: Secondary | ICD-10-CM

## 2013-10-04 ENCOUNTER — Telehealth: Payer: Self-pay | Admitting: Emergency Medicine

## 2013-10-04 NOTE — Telephone Encounter (Signed)
LM FOR PT TO GO OVER PREP FOR HIS RENAL US ON 11-14-13.   PREP- DAY PRIOR LIQUIDS ONLY, THEN AT MIDDNIGHT- NPO- MORNING OF U/S TAKE GASX - PER MESHA  PT CALLED BACK ON 10-18-13

## 2013-10-08 ENCOUNTER — Encounter (HOSPITAL_COMMUNITY): Payer: Self-pay

## 2013-10-12 ENCOUNTER — Telehealth: Payer: Self-pay | Admitting: Internal Medicine

## 2013-10-12 ENCOUNTER — Other Ambulatory Visit (INDEPENDENT_AMBULATORY_CARE_PROVIDER_SITE_OTHER): Payer: BC Managed Care – PPO

## 2013-10-12 DIAGNOSIS — I5022 Chronic systolic (congestive) heart failure: Secondary | ICD-10-CM

## 2013-10-12 DIAGNOSIS — I2589 Other forms of chronic ischemic heart disease: Secondary | ICD-10-CM

## 2013-10-12 DIAGNOSIS — I255 Ischemic cardiomyopathy: Secondary | ICD-10-CM

## 2013-10-12 LAB — BASIC METABOLIC PANEL
BUN: 25 mg/dL — ABNORMAL HIGH (ref 6–23)
CALCIUM: 9.8 mg/dL (ref 8.4–10.5)
CO2: 26 mEq/L (ref 19–32)
CREATININE: 1.6 mg/dL — AB (ref 0.4–1.5)
Chloride: 106 mEq/L (ref 96–112)
GFR: 47.14 mL/min — AB (ref >60.00)
GLUCOSE: 99 mg/dL (ref 70–99)
Potassium: 4.6 mEq/L (ref 3.5–5.1)
Sodium: 138 mEq/L (ref 135–145)

## 2013-10-12 LAB — CBC WITH DIFFERENTIAL/PLATELET
Basophils Absolute: 0 10*3/uL (ref 0.0–0.1)
Basophils Relative: 0.3 % (ref 0.0–3.0)
Eosinophils Absolute: 0.3 10*3/uL (ref 0.0–0.7)
Eosinophils Relative: 3 % (ref 0.0–5.0)
HCT: 47.6 % (ref 39.0–52.0)
Hemoglobin: 15.4 g/dL (ref 13.0–17.0)
LYMPHS PCT: 26.4 % (ref 12.0–46.0)
Lymphs Abs: 2.4 10*3/uL (ref 0.7–4.0)
MCHC: 32.4 g/dL (ref 30.0–36.0)
MCV: 89.5 fl (ref 78.0–100.0)
MONOS PCT: 8.4 % (ref 3.0–12.0)
Monocytes Absolute: 0.8 10*3/uL (ref 0.1–1.0)
Neutro Abs: 5.7 10*3/uL (ref 1.4–7.7)
Neutrophils Relative %: 61.9 % (ref 43.0–77.0)
Platelets: 210 10*3/uL (ref 150.0–400.0)
RBC: 5.32 Mil/uL (ref 4.22–5.81)
RDW: 15.7 % — ABNORMAL HIGH (ref 11.5–14.6)
WBC: 9.2 10*3/uL (ref 4.5–10.5)

## 2013-10-12 NOTE — Telephone Encounter (Signed)
New message          Pt is schedule for procedure on Monday at the hospital. He is not able to come in today for his lab. Can he do it early Monday morning at the hospital??

## 2013-10-12 NOTE — Telephone Encounter (Signed)
Pt states that due to closings he has not had his labs done for procedure on Monday.  He doesn't think he can make it today before 4:39.  I instructed patient to go to hospital lab tomorrow.  He asks if he can wait until am of procedure.  I told him that is worst case scenario.  He will try to have them drawn today or tomorrow.

## 2013-10-14 MED ORDER — SODIUM CHLORIDE 0.9 % IR SOLN
80.0000 mg | Status: DC
  Filled 2013-10-14: qty 2

## 2013-10-14 MED ORDER — CEFAZOLIN SODIUM-DEXTROSE 2-3 GM-% IV SOLR
2.0000 g | INTRAVENOUS | Status: DC
  Filled 2013-10-14: qty 50

## 2013-10-15 ENCOUNTER — Encounter (HOSPITAL_COMMUNITY)
Admission: RE | Disposition: A | Discharge status: Home / Self Care | Payer: Self-pay | Source: Ambulatory Visit | Attending: Internal Medicine

## 2013-10-15 ENCOUNTER — Observation Stay (HOSPITAL_COMMUNITY)
Admission: RE | Admit: 2013-10-15 | Discharge: 2013-10-16 | Disposition: A | Discharge status: Home / Self Care | Payer: BC Managed Care – PPO | Source: Ambulatory Visit | Attending: Internal Medicine | Admitting: Internal Medicine

## 2013-10-15 DIAGNOSIS — I252 Old myocardial infarction: Secondary | ICD-10-CM | POA: Insufficient documentation

## 2013-10-15 DIAGNOSIS — Z4502 Encounter for adjustment and management of automatic implantable cardiac defibrillator: Secondary | ICD-10-CM | POA: Diagnosis present

## 2013-10-15 DIAGNOSIS — I2589 Other forms of chronic ischemic heart disease: Secondary | ICD-10-CM

## 2013-10-15 DIAGNOSIS — Z95810 Presence of automatic (implantable) cardiac defibrillator: Secondary | ICD-10-CM | POA: Insufficient documentation

## 2013-10-15 DIAGNOSIS — Z79899 Other long term (current) drug therapy: Secondary | ICD-10-CM | POA: Insufficient documentation

## 2013-10-15 DIAGNOSIS — I739 Peripheral vascular disease, unspecified: Secondary | ICD-10-CM | POA: Insufficient documentation

## 2013-10-15 DIAGNOSIS — Z951 Presence of aortocoronary bypass graft: Secondary | ICD-10-CM | POA: Insufficient documentation

## 2013-10-15 DIAGNOSIS — I5022 Chronic systolic (congestive) heart failure: Secondary | ICD-10-CM | POA: Insufficient documentation

## 2013-10-15 DIAGNOSIS — N189 Chronic kidney disease, unspecified: Secondary | ICD-10-CM | POA: Insufficient documentation

## 2013-10-15 DIAGNOSIS — I255 Ischemic cardiomyopathy: Secondary | ICD-10-CM

## 2013-10-15 DIAGNOSIS — I2510 Atherosclerotic heart disease of native coronary artery without angina pectoris: Secondary | ICD-10-CM | POA: Insufficient documentation

## 2013-10-15 DIAGNOSIS — E785 Hyperlipidemia, unspecified: Secondary | ICD-10-CM | POA: Insufficient documentation

## 2013-10-15 HISTORY — PX: IMPLANTABLE CARDIOVERTER DEFIBRILLATOR IMPLANT: SHX5860

## 2013-10-15 HISTORY — DX: Presence of automatic (implantable) cardiac defibrillator: Z95.810

## 2013-10-15 HISTORY — PX: IMPLANTABLE CARDIOVERTER DEFIBRILLATOR IMPLANT: SHX5473

## 2013-10-15 LAB — SURGICAL PCR SCREEN
MRSA, PCR: NEGATIVE
STAPHYLOCOCCUS AUREUS: NEGATIVE

## 2013-10-15 SURGERY — IMPLANTABLE CARDIOVERTER DEFIBRILLATOR IMPLANT
Anesthesia: LOCAL

## 2013-10-15 MED ORDER — ATORVASTATIN CALCIUM 80 MG PO TABS
80.0000 mg | ORAL_TABLET | Freq: Every day | ORAL | Status: DC
  Administered 2013-10-15 – 2013-10-16 (×2): 80 mg via ORAL
  Filled 2013-10-15 (×2): qty 1

## 2013-10-15 MED ORDER — SODIUM CHLORIDE 0.9 % IV SOLN
INTRAVENOUS | Status: DC
  Administered 2013-10-15: 07:00:00 via INTRAVENOUS

## 2013-10-15 MED ORDER — ESCITALOPRAM OXALATE 20 MG PO TABS
20.0000 mg | ORAL_TABLET | Freq: Every day | ORAL | Status: DC
  Administered 2013-10-15 – 2013-10-16 (×2): 20 mg via ORAL
  Filled 2013-10-15 (×2): qty 1

## 2013-10-15 MED ORDER — HYDRALAZINE HCL 25 MG PO TABS
25.0000 mg | ORAL_TABLET | Freq: Three times a day (TID) | ORAL | Status: DC
  Administered 2013-10-15 – 2013-10-16 (×4): 25 mg via ORAL
  Filled 2013-10-15 (×6): qty 1

## 2013-10-15 MED ORDER — CEFAZOLIN SODIUM 1-5 GM-% IV SOLN
1.0000 g | Freq: Four times a day (QID) | INTRAVENOUS | Status: AC
  Administered 2013-10-15 – 2013-10-16 (×3): 1 g via INTRAVENOUS
  Filled 2013-10-15 (×3): qty 50

## 2013-10-15 MED ORDER — MUPIROCIN 2 % EX OINT
TOPICAL_OINTMENT | Freq: Two times a day (BID) | CUTANEOUS | Status: DC
  Administered 2013-10-15: 1 via NASAL
  Filled 2013-10-15 (×2): qty 22

## 2013-10-15 MED ORDER — METOPROLOL SUCCINATE ER 100 MG PO TB24
200.0000 mg | ORAL_TABLET | Freq: Every day | ORAL | Status: DC
  Administered 2013-10-15 – 2013-10-16 (×2): 200 mg via ORAL
  Filled 2013-10-15 (×2): qty 2

## 2013-10-15 MED ORDER — ACETAMINOPHEN 325 MG PO TABS: 325.0000 mg | ORAL_TABLET | ORAL | Status: DC | PRN

## 2013-10-15 MED ORDER — DIGOXIN 0.0625 MG HALF TABLET
0.0625 mg | ORAL_TABLET | ORAL | Status: DC
  Administered 2013-10-15: 0.0625 mg via ORAL
  Filled 2013-10-15: qty 1

## 2013-10-15 MED ORDER — BUPROPION HCL ER (SR) 150 MG PO TB12
150.0000 mg | ORAL_TABLET | Freq: Every day | ORAL | Status: DC
  Administered 2013-10-15 – 2013-10-16 (×2): 150 mg via ORAL
  Filled 2013-10-15 (×2): qty 1

## 2013-10-15 MED ORDER — CHLORHEXIDINE GLUCONATE 4 % EX LIQD
60.0000 mL | Freq: Once | CUTANEOUS | Status: DC
  Filled 2013-10-15: qty 60

## 2013-10-15 MED ORDER — HEPARIN (PORCINE) IN NACL 2-0.9 UNIT/ML-% IJ SOLN
INTRAMUSCULAR | Status: AC
  Filled 2013-10-15: qty 500

## 2013-10-15 MED ORDER — ASPIRIN 325 MG PO TABS
325.0000 mg | ORAL_TABLET | Freq: Every day | ORAL | Status: DC
  Administered 2013-10-15 – 2013-10-16 (×2): 325 mg via ORAL
  Filled 2013-10-15 (×2): qty 1

## 2013-10-15 MED ORDER — FENTANYL CITRATE 0.05 MG/ML IJ SOLN
INTRAMUSCULAR | Status: AC
  Filled 2013-10-15: qty 2

## 2013-10-15 MED ORDER — ONDANSETRON HCL 4 MG/2ML IJ SOLN: 4.0000 mg | Freq: Four times a day (QID) | INTRAMUSCULAR | Status: DC | PRN

## 2013-10-15 MED ORDER — SODIUM CHLORIDE 0.9 % IV SOLN: INTRAVENOUS | Status: AC

## 2013-10-15 MED ORDER — VITAMIN D (ERGOCALCIFEROL) 1.25 MG (50000 UNIT) PO CAPS: 50000.0000 [IU] | ORAL_CAPSULE | ORAL | Status: DC

## 2013-10-15 MED ORDER — LIDOCAINE HCL (PF) 1 % IJ SOLN
INTRAMUSCULAR | Status: AC
  Filled 2013-10-15: qty 60

## 2013-10-15 MED ORDER — LIDOCAINE HCL (PF) 1 % IJ SOLN
INTRAMUSCULAR | Status: AC
  Filled 2013-10-15: qty 30

## 2013-10-15 MED ORDER — ISOSORBIDE MONONITRATE ER 30 MG PO TB24
30.0000 mg | ORAL_TABLET | Freq: Every day | ORAL | Status: DC
  Administered 2013-10-15 – 2013-10-16 (×2): 30 mg via ORAL
  Filled 2013-10-15 (×2): qty 1

## 2013-10-15 MED ORDER — MIDAZOLAM HCL 5 MG/5ML IJ SOLN
INTRAMUSCULAR | Status: AC
  Filled 2013-10-15: qty 5

## 2013-10-15 NOTE — CV Procedure (Signed)
ZYLON CREAMER [id number]  [id number]  Preop Dx: ischemic cardiomyopathy Postop Dx same/   Procedure: single chamber ICD implantation without intraoperative defibrillation threshold testing  Following the obtaining of informed consent the patient was brought to the electrophysiology laboratory in place of the fluoroscopic table in the supine position. After routine prep and drape, lidocaine was infiltrated in the prepectoral subclavicular region and an incision was made and carried down to the layer of the prepectoral fascia using electrocautery and sharp dissection. A pocket was formed similarly.  Thereafter an attention was turned to gain access to the extrathoracic left subclavian vein which was accomplished without difficulty and without the aspiration of air or puncture of the artery. A 9 French sheath was placed for which was then passed a  Single coil  active fixation defibrillator lead, model Medtronic [id number] serial number [id number]. It was  passed under fluoroscopic guidance to the right ventricular apex. In its location the bipolar R wave was 5.5 millivolts, impedance was 855 ohms, the pacing threshold was 0.9 volts at 0.5 msec. Current at threshold was 1.0 mA.  There was no diaphragmatic pacing at 10 V. The current of injury was brisk .  The lead was secured to the prepectoral fascia and then attached to a Medtronic [id number] ICD, serial number  [id number].  Through the device, the bipolar R wave was 5.6 millivolts, impedance was 551 ohms, the pacing threshold was 0.75 volts at 0.4 msec. High-voltage impedance of 72 ohms.  The pocket was copiously irrigated with antibiotic containing saline solution. Hemostasis was assured, and the device and the lead was placed in the pocket and secured to the prepectoral fascia.  The wound is closed in 2 layers in normal fashion. The wound is washed dried and a DERMABOND DRESSING  was then applied. Needle counts sponge counts and instrument counts were correct  at the end of the procedure according to the staff.    Patient tolerated the procedure without apparent complication      Cx: None     Virl Axe, MD 10/15/2013 8:46 AM

## 2013-10-15 NOTE — H&P (View-Only) (Signed)
Patient Care Team: Florina Ou, MD as PCP - General (Family Medicine) Florina Ou, MD as Attending Physician (Family Medicine)   HPI  Logan Burton is a 58 y.o. male Was seen in December for consideration of an ICD. He also right bundle branch block and a QRS rate of 134 ms. Consideration for defibrillator implantation was deferred until he had a chance to follow up with heart failure clinic.  Is a history of ischemic heart disease with prior bypass surgery and mild renal insufficiency.  His symptoms have worsened and his CPX is concerning. A right heart cath was undertaken suggesting modest intravascular volume depletion and well compensated hemodynamics.  His QRS duration most recently was 122 with right bundle branch block.  He also has complaints of dizziness. I wonder whether this is related to intravascular volume depletion  Past Medical History  Diagnosis Date  . Coronary artery disease   . 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 History of heart attack   . CHF (congestive heart failure)   . Chronic renal insufficiency   . PVD (peripheral vascular disease)   . Dyslipidemia   . Myocardial infarction     Past Surgical History  Procedure Laterality Date  . Coronary artery bypass graft    . Cervical fusion    . Hernia repair    . Angioplasty      Current Outpatient Prescriptions  Medication Sig Dispense Refill  . aspirin 325 MG tablet Take 325 mg by mouth daily.        Marland Kitchen atorvastatin (LIPITOR) 80 MG tablet TAKE 1 TABLET (80 MG TOTAL) BY MOUTH DAILY.  90 tablet  1  . buPROPion (WELLBUTRIN SR) 150 MG 12 hr tablet Take 150 mg by mouth daily.      . digoxin (LANOXIN) 0.125 MG tablet Take 0.5 tablets (0.0625 mg total) by mouth every other day.  45 tablet  3  . escitalopram (LEXAPRO) 20 MG tablet Take 1 tablet by mouth daily.      . fenofibrate (TRICOR) 145 MG tablet TAKE 1 TABLET BY MOUTH EVERY DAY  90 tablet  0  . hydrALAZINE (APRESOLINE) 25 MG  tablet Take 1 tablet (25 mg total) by mouth 3 (three) times daily.  270 tablet  3  . isosorbide mononitrate (IMDUR) 30 MG 24 hr tablet Take 1 tablet (30 mg total) by mouth daily.  90 tablet  3  . metoprolol (TOPROL-XL) 200 MG 24 hr tablet TAKE 1 TABLET BY MOUTH EVERY DAY  90 tablet  2  . Omega-3 Fatty Acids (FISH OIL) 600 MG CAPS Take 1 capsule by mouth daily.      . Vitamin D, Ergocalciferol, (DRISDOL) 50000 UNITS CAPS once a week.       No current facility-administered medications for this visit.    No Known Allergies  Review of Systems negative except from HPI and PMH  Physical Exam BP 119/71  Pulse 66  Ht 6' (1.829 m) Well developed and nourished in no acute distress HENT normal Neck supple with JVP-flat Clear Regular rate and rhythm, no murmurs or gallops Abd-soft with active BS No Clubbing cyanosis edema Skin-warm and dry A & Oriented  Grossly normal sensory and motor function   ECG from 25 demonstrated sinus rhythm at 68 intervals 15/12/42 Inferolateral MI   Assessment and  Plan  Ischemic cardiomyopathy  Class II congestive heart failure  Right bundle branch block QRS duration 122 ms  Renal insufficiency  The patient needs criteria for ICD implantation for primary prevention. He has persistent left ventricular dysfunction with class II congestive heart failure. His QRS is modestly brought to sufficiently so justify CRT especially given right bundle branch block configuration and modest congestive symptoms  Have reviewed the potential benefits and risks of ICD implantation including but not limited to death, perforation of heart or lung, lead dislodgement, infection,  device malfunction and inappropriate shocks.  The patient  express understanding  and are willing to proceed.

## 2013-10-15 NOTE — Interval H&P Note (Signed)
ICD Criteria  Current LVEF:30% ;Obtained > 6 months ago.   NYHA Functional Classification: Class II  Heart Failure History:  Yes, Duration of heart failure since onset is > 9 months  Non-Ischemic Dilated Cardiomyopathy History:  No.  Atrial Fibrillation/Atrial Flutter:  No.  Ventricular Tachycardia History:  No.  Cardiac Arrest History:  No  History of Syndromes with Risk of Sudden Death:  No.  Previous ICD:  No.  Electrophysiology Study: No.  Prior MI: Yes, Most recent MI timeframe is > 40 days.  PPM: No.  OSA:  No  Patient Life Expectancy of >=1 year: Yes.  Anticoagulation Therapy:  Patient is NOT on anticoagulation therapy.   Beta Blocker Therapy:  Yes.   Ace Inhibitor/ARB Therapy:  No, Reason not on Ace Inhibitor/ARB therapy:  renal insuffcieincyHistory and Physical Interval Note:  10/15/2013 7:15 AM  Logan Burton  has presented today for surgery, with the diagnosis of ICM  The various methods of treatment have been discussed with the patient and family. After consideration of risks, benefits and other options for treatment, the patient has consented to  Procedure(s): IMPLANTABLE CARDIOVERTER DEFIBRILLATOR IMPLANT (N/A) as a surgical intervention .  The patient's history has been reviewed, patient examined, no change in status, stable for surgery.  I have reviewed the patient's chart and labs.  Questions were answered to the patient's satisfaction.     Virl Axe

## 2013-10-15 NOTE — Progress Notes (Signed)
Orthopedic Tech Progress Note Patient Details:  Logan Burton [birth year] [id number]  Ortho Devices Type of Ortho Device: Arm sling Ortho Device/Splint Interventions: Application   Cammer, Theodoro Parma 10/15/2013, 1:36 PM

## 2013-10-16 ENCOUNTER — Observation Stay (HOSPITAL_COMMUNITY): Payer: BC Managed Care – PPO

## 2013-10-16 DIAGNOSIS — I2589 Other forms of chronic ischemic heart disease: Secondary | ICD-10-CM

## 2013-10-16 MED ORDER — ACETAMINOPHEN 325 MG PO TABS: 325.0000 mg | ORAL_TABLET | ORAL | Status: DC | PRN

## 2013-10-16 NOTE — Discharge Summary (Signed)
ELECTROPHYSIOLOGY PROCEDURE DISCHARGE SUMMARY    Patient ID: Logan Burton,  MRN: [id number], DOB/AGE: 1956-04-22 58 y.o.  Admit date: 10/15/2013 Discharge date: 10/16/2013  Primary Care Physician: Florina Ou, MD Primary Cardiologist: Liam Rogers, MD Heart Failure: Bensimhon Electrophysiologist: Caryl Comes  Primary Discharge Diagnosis:  Ischemic cardiomyopathy status post ICD implantation this admission  Secondary Discharge Diagnosis:  1.  CAD s/p CABG 2.  CHF 3.  Chronic renal insufficiency 4.  PVD 5.  Dyslipidemia 6.  RBBB  No Known Allergies   Procedures This Admission:  1.  Implantation of a Medtronic single chamber ICD on 10-15-13 by Dr Caryl Comes.  The patient received a Medtronic model number [id number] ICD with model number 4627 right ventricular lead.  DFT's were deferred at time of implant.  There were no immediate post procedure complications. 2.  CXR on 10-16-13 demonstrated no pneumothorax status post device implantation.   Brief HPI: Logan Burton is a 57 y.o. male was referred to electrophysiology in the outpatient setting for consideration of ICD implantation.  Past medical history includes ischemic cardiomyopathy, CHF, and RBBB.  The patient has persistent LV dysfunction despite guideline directed therapy.  Risks, benefits, and alternatives to ICD implantation were reviewed with the patient who wished to proceed.   Hospital Course:  The patient was admitted and underwent implantation of a Medtronic ICD with details as outlined above.   He was monitored on telemetry overnight which demonstrated sinus rhythm.  Left chest was without hematoma or ecchymosis.  The device was interrogated and found to be functioning normally.  CXR was obtained and demonstrated no pneumothorax status post device implantation.  Wound care, arm mobility, and restrictions were reviewed with the patient.  Dr Caryl Comes examined the patient and considered them stable for discharge to home.   The patient's  discharge medications include a beta blocker (Metoprolol). He is not on an ACE-I due to chronic renal insuffiencey.   Discharge Vitals: Blood pressure 123/68, pulse 67, temperature 98 F (36.7 C), temperature source Oral, resp. rate 18, height 6' (1.829 m), weight 192 lb (87.091 kg), SpO2 98.00%.    Labs:   Lab Results  Component Value Date   WBC 9.2 10/12/2013   HGB 15.4 10/12/2013   HCT 47.6 10/12/2013   MCV 89.5 10/12/2013   PLT 210.0 10/12/2013    Recent Labs Lab 10/12/13 1244  NA 138  K 4.6  CL 106  CO2 26  BUN 25*  CREATININE 1.6*  CALCIUM 9.8  GLUCOSE 99     Discharge Medications:    Medication List         aspirin 325 MG tablet  Take 325 mg by mouth daily.     atorvastatin 80 MG tablet  Commonly known as:  LIPITOR  Take 80 mg by mouth daily.     buPROPion 150 MG 12 hr tablet  Commonly known as:  WELLBUTRIN SR  Take 150 mg by mouth daily.     digoxin 0.125 MG tablet  Commonly known as:  LANOXIN  Take 0.0625 mg by mouth every other day.     escitalopram 20 MG tablet  Commonly known as:  LEXAPRO  Take 20 mg by mouth daily.     fenofibrate 145 MG tablet  Commonly known as:  TRICOR  Take 145 mg by mouth daily.     Fish Oil 600 MG Caps  Take 600 mg by mouth daily.     hydrALAZINE 25 MG tablet  Commonly known as:  APRESOLINE  Take 25 mg by mouth 3 (three) times daily.     isosorbide mononitrate 30 MG 24 hr tablet  Commonly known as:  IMDUR  Take 30 mg by mouth daily.     metoprolol 200 MG 24 hr tablet  Commonly known as:  TOPROL-XL  Take 200 mg by mouth daily.     Vitamin D (Ergocalciferol) 50000 UNITS Caps capsule  Commonly known as:  DRISDOL  Take 50,000 Units by mouth once a week. Every Wednesday        Disposition:      Discharge Orders   Future Appointments Provider Department Dept Phone   10/25/2013 9:30 AM [facility] [place] [phone]   11/07/2013 11:00 AM [facility] [place] [facility] [phone]   11/07/2013 12:00 PM [place] [phone]   11/14/2013 1:00 PM [facility] [place] [facility] [place] IMAG[facility] AT [facility] [phone]   Please drink 32 oz of water 1 hour prior to appt. time and hold bladder. DO NOT VOID- Bladder must be full at time of study.   11/14/2013 1:45 PM [facility] [place] [facility]  IMAGING AT [facility] [phone]   Please drink 32 oz of water 1 hour prior to appt. time and hold bladder. DO NOT VOID- Bladder must be full at time of study.   11/14/2013 2:30 PM [facility] [place] IM[facility] AT [place] [phone]   11/28/2013 2:00 PM Thayer Headings, MD [place] Office [phone]   01/22/2014 9:15 AM Deboraha Sprang, MD [facility] [phone]   Future Orders Complete By Expires   Diet - low sodium heart healthy  As directed    Increase activity slowly  As directed      Follow-up Information   Follow up with [facility] Office On 10/25/2013. (At 9:30 AM for wound check)    Specialty:  Cardiology   Contact information:   [address] [place] [address] [phone]      Follow up with Virl Axe, MD On 01/22/2014. (At 9:15 AM)    Specialty:  Cardiology   Contact information:   [address] [place] [address] [phone]       Duration of Discharge Encounter: Greater than 30 minutes including physician time.  Signed,    Mikle Bosworth.D.

## 2013-10-16 NOTE — Discharge Instructions (Signed)
° °  Supplemental Discharge Instructions for  Pacemaker/Defibrillator Patients  Activity No heavy lifting or vigorous activity with your left/right arm for 6 to 8 weeks.  Do not raise your left/right arm above your head for one week.  Gradually raise your affected arm as drawn below.           03/05                       03/06                       03/07                     03/08       NO DRIVING for 1 week; you may begin driving on 73/41/9379. WOUND CARE   Keep the wound area clean and dry. You may shower but no soaking in tub bath, swimming pool or hot tub for 7-10 days, until wound is completely healed.    The Dermabond (glue) on your wound will fall off on its own; do not pull it off.   No bandage is needed on the site.  DO  NOT apply any creams, oils, or ointments to the wound area.   If you notice any drainage or discharge from the wound, any swelling or bruising at the site, or you develop a fever > 101? F after you are discharged home, call the office at once.  Special Instructions   You are still able to use cellular telephones; use the ear opposite the side where you have your pacemaker/defibrillator.  Avoid carrying your cellular phone near your device.   When traveling through airports, show security personnel your identification card to avoid being screened in the metal detectors.  Ask the security personnel to use the hand wand.   Avoid arc welding equipment, MRI testing (magnetic resonance imaging), TENS units (transcutaneous nerve stimulators).  Call the office for questions about other devices.   Avoid electrical appliances that are in poor condition or are not properly grounded.   Microwave ovens are safe to be near or to operate.  Additional information for defibrillator patients should your device go off:   If your device goes off ONCE and you feel fine afterward, notify the device clinic nurses.   If your device goes off ONCE and you do not feel well afterward, call  911.   If your device goes off TWICE, call 911.   If your device goes off THREE times in one day, call 911.  DO NOT DRIVE YOURSELF OR A FAMILY MEMBER WITH A DEFIBRILLATOR TO THE HOSPITAL--CALL 911.

## 2013-10-17 ENCOUNTER — Ambulatory Visit (HOSPITAL_COMMUNITY): Payer: BC Managed Care – PPO

## 2013-10-17 ENCOUNTER — Encounter (HOSPITAL_COMMUNITY): Payer: BC Managed Care – PPO

## 2013-10-19 ENCOUNTER — Ambulatory Visit: Payer: BC Managed Care – PPO | Admitting: Cardiovascular Disease

## 2013-10-22 ENCOUNTER — Ambulatory Visit: Payer: BC Managed Care – PPO

## 2013-10-23 ENCOUNTER — Encounter (HOSPITAL_COMMUNITY): Payer: Self-pay | Admitting: *Deleted

## 2013-10-24 ENCOUNTER — Other Ambulatory Visit: Payer: Self-pay | Admitting: Cardiovascular Disease

## 2013-10-25 ENCOUNTER — Ambulatory Visit (INDEPENDENT_AMBULATORY_CARE_PROVIDER_SITE_OTHER): Payer: BC Managed Care – PPO | Admitting: *Deleted

## 2013-10-25 DIAGNOSIS — I509 Heart failure, unspecified: Secondary | ICD-10-CM

## 2013-10-25 DIAGNOSIS — I5022 Chronic systolic (congestive) heart failure: Secondary | ICD-10-CM

## 2013-10-25 LAB — MDC_IDC_ENUM_SESS_TYPE_INCLINIC
Battery Remaining Longevity: 137 mo
Battery Voltage: 3.13 V
Brady Statistic RV Percent Paced: 0 %
HIGH POWER IMPEDANCE MEASURED VALUE: 228 Ohm
HIGH POWER IMPEDANCE MEASURED VALUE: 67 Ohm
Lead Channel Impedance Value: 456 Ohm
Lead Channel Pacing Threshold Pulse Width: 0.4 ms
Lead Channel Setting Pacing Pulse Width: 0.4 ms
Lead Channel Setting Sensing Sensitivity: 0.3 mV
MDC IDC MSMT LEADCHNL RV PACING THRESHOLD AMPLITUDE: 0.625 V
MDC IDC MSMT LEADCHNL RV SENSING INTR AMPL: 7 mV
MDC IDC SESS DTM: 20150312100007
MDC IDC SET LEADCHNL RV PACING AMPLITUDE: 3.5 V
MDC IDC SET ZONE DETECTION INTERVAL: 300 ms
Zone Setting Detection Interval: 340 ms
Zone Setting Detection Interval: 360 ms

## 2013-10-25 MED ORDER — FENOFIBRATE 145 MG PO TABS: ORAL_TABLET | ORAL | Status: DC

## 2013-10-25 NOTE — Progress Notes (Signed)
Wound check appointment.  Wound without redness or edema. Incision edges approximated, wound well healed. Normal device function. Thresholds, sensing, and impedances consistent with implant measurements. Device programmed at 3.5V for extra safety margin until 3 month visit. Histogram distribution appropriate for patient and level of activity. No ventricular arrhythmias noted. Patient educated about wound care, arm mobility, lifting restrictions, shock plan. ROV in 3 months with implanting physician.

## 2013-11-01 ENCOUNTER — Other Ambulatory Visit: Payer: Self-pay | Admitting: Cardiology

## 2013-11-07 ENCOUNTER — Ambulatory Visit (HOSPITAL_BASED_OUTPATIENT_CLINIC_OR_DEPARTMENT_OTHER)
Admit: 2013-11-07 | Discharge: 2013-11-07 | Disposition: A | Discharge status: Home / Self Care | Payer: BC Managed Care – PPO | Attending: Internal Medicine | Admitting: Internal Medicine

## 2013-11-07 ENCOUNTER — Encounter (HOSPITAL_COMMUNITY): Payer: Self-pay | Admitting: *Deleted

## 2013-11-07 ENCOUNTER — Ambulatory Visit (HOSPITAL_COMMUNITY)
Admission: RE | Admit: 2013-11-07 | Discharge: 2013-11-07 | Disposition: A | Discharge status: Home / Self Care | Payer: BC Managed Care – PPO | Source: Ambulatory Visit | Attending: Cardiovascular Disease | Admitting: Cardiovascular Disease

## 2013-11-07 VITALS — BP 120/68 | HR 62 | Wt 193.0 lb

## 2013-11-07 DIAGNOSIS — I2510 Atherosclerotic heart disease of native coronary artery without angina pectoris: Secondary | ICD-10-CM | POA: Insufficient documentation

## 2013-11-07 DIAGNOSIS — I509 Heart failure, unspecified: Secondary | ICD-10-CM

## 2013-11-07 DIAGNOSIS — I517 Cardiomegaly: Secondary | ICD-10-CM

## 2013-11-07 DIAGNOSIS — I5022 Chronic systolic (congestive) heart failure: Secondary | ICD-10-CM | POA: Insufficient documentation

## 2013-11-07 MED ORDER — ISOSORBIDE MONONITRATE ER 30 MG PO TB24: 30.0000 mg | ORAL_TABLET | Freq: Two times a day (BID) | ORAL | Status: DC

## 2013-11-07 MED ORDER — HYDRALAZINE HCL 25 MG PO TABS: 37.5000 mg | ORAL_TABLET | Freq: Three times a day (TID) | ORAL | Status: DC

## 2013-11-07 NOTE — Patient Instructions (Signed)
Increase Hydralazine to 1 & 1/2 tabs Three times a day  Increase Imdur (Isosorbide) to 1 tab Twice daily   Your physician recommends that you schedule a follow-up appointment in: 1 month

## 2013-11-07 NOTE — Progress Notes (Signed)
Patient ID: Logan Burton, male   DOB: 06-10-1956, 58 y.o.   MRN: [id number]  HPI: Logan Burton is a 58 year old with a history of ICM, S/P CABG 7846 , chronic systolic heart failure 04/6294 EF 30-35%, CRI creatinine baseline 2.0, L renal artery stenosis S/P stent 2013, atrophic  r kidney , RBBB, PAD, and hyperlipidemia. He has not been on ace/spiro due CKD. Underwent MDT ICD on 10/15/13.   6/12/ 2014  Nuclear Stress test - LVEF 26% Inferior and inferolateral akinesis with the remainder LV hypokinetic.  02/07/2009 LHC LAD 70% stenosis in the mid vessel and 50% stenosis at the insertion of the left internal mammary artery. Circumflex occluded.  PFTs 02/02/13 FEV1 2.58 (66%) ratio 70 with FV curve c/w obst  CPX 07/05/13  Peak VO2: 15.4 (48.2% predicted peak VO2) VE/VCO2 slope: 41.7 OUES: 1.51 Peak RER: 1.16  09/17/2013 RA = 3  RV = 26/0/5  PA = 26/8 (16)  PCW = 7  Fick cardiac output/index = 4.5/2.2  Thermo CO/CI = 4.3/2.1  PVR = 1.0 WU  FA sat = 92%  PA sat = 64%, 66%  Follow-up: Returns for follow-up. Underwent ICD placement 10/15/13. Feels ok. Remains dyspneic with mild exertion. No orthopnea or PND. No edema.  Weighing every day weight stable 186-188. Still with occasional dizziness.   Echo 10/28/13 30%  Labs 05/18/13 K 4.4 Creatinine 1.8 Labs 09/07/13 K 4.2 Creatinine 2.0 Dig level 0.3  Labs 10/12/13 K 4.6 Cr 1.6   FH: No family members with cardiac history SH: Divorced.  Lives alone.  Quit smoking 2007. Does not drink alcohol. Works full time to [employer] trader     Past Medical History  Diagnosis Date  . Coronary artery disease     s/p CABG  . Myocardial infarction   . CHF (congestive heart failure)   . Chronic renal insufficiency   . PVD (peripheral vascular disease)   . Dyslipidemia     Current Outpatient Prescriptions  Medication Sig Dispense Refill  . acetaminophen (TYLENOL) 325 MG tablet Take 1-2 tablets (325-650 mg total) by mouth every 4 (four) hours as needed  for mild pain.      Marland Kitchen aspirin 325 MG tablet Take 325 mg by mouth daily.        Marland Kitchen atorvastatin (LIPITOR) 80 MG tablet Take 80 mg by mouth daily.      Marland Kitchen buPROPion (WELLBUTRIN SR) 150 MG 12 hr tablet Take 150 mg by mouth daily.      . digoxin (LANOXIN) 0.125 MG tablet Take 0.0625 mg by mouth every other day.      . escitalopram (LEXAPRO) 20 MG tablet Take 20 mg by mouth daily.       . fenofibrate (TRICOR) 145 MG tablet Take 145 mg by mouth daily.      . hydrALAZINE (APRESOLINE) 25 MG tablet Take 25 mg by mouth 3 (three) times daily.      . isosorbide mononitrate (IMDUR) 30 MG 24 hr tablet Take 30 mg by mouth daily.      . metoprolol (TOPROL-XL) 200 MG 24 hr tablet Take 200 mg by mouth daily.      . Omega-3 Fatty Acids (FISH OIL) 600 MG CAPS Take 600 mg by mouth daily.       . Vitamin D, Ergocalciferol, (DRISDOL) 50000 UNITS CAPS Take 50,000 Units by mouth once a week. Every Wednesday       No current facility-administered medications for this encounter.  No Known Allergies  History   Social History  . Marital Status: Divorced    Spouse Name: N/A    Number of Children: N/A  . Years of Education: N/A   Occupational History  . [employer]    Social History Main Topics  . Smoking status: Former Smoker -- 2.00 packs/day for 30 years    Types: Cigarettes    Quit date: 02/23/2006  . Smokeless tobacco: Never Used  . Alcohol Use: Yes     Comment: only rare social occasions  . Drug Use: No  . Sexual Activity: Not Currently   Other Topics Concern  . Not on file   Social History Narrative  . No narrative on file    No family history on file.  PHYSICAL EXAM: Filed Vitals:   11/07/13 1244  BP: 120/68  Pulse: 62   General:  Well appearing. No respiratory difficulty Nephew present  HEENT: normal Neck: supple. no JVD. Carotids 2+ bilat; no bruits. No lymphadenopathy or thryomegaly appreciated. Cor: PMI nondisplaced. Regular rate & rhythm. No rubs, gallops or  murmurs. Lungs: clear Abdomen: soft, nontender, nondistended. No hepatosplenomegaly. No bruits or masses. Good bowel sounds. Extremities: no cyanosis, clubbing, rash, edema Neuro: alert & oriented x 3, cranial nerves grossly intact. moves all 4 extremities w/o difficulty. Affect pleasant.    No results found for this or any previous visit (from the past 24 hour(s)). No results found.   ASSESSMENT & PLAN:  1. Chronic Systolic Heart Failure ICM, 11/07/12 EF 30% Overall stable NYHA III. CPX borderline for need for advanced therapies. Volume status looks good.  Will continue aggressive medical therapy Continue Toprol XL 200 mg daily . Increase hydralazine 37.5 mg tid. Increase Imdur 30 mg bid . He understands he can not use viagra  Will not add ace or spiro due to CKD. L RAS--> S/P stent 2013 .  Add digoxin 0. 0625 mg every other day. Follow dig level closely.  If functional status getting worse would consider VAD evaluation.  2. RBBB  3. CKD creatinine baseline 2.0 - no ace, spiro 4. L renal aretery stenosis S/P stent 2013. Atrophic R kidney.  Follow up 1 month.  Glori Bickers MD 12:56 PM   Patient seen and examined with Darrick Grinder, NP. We discussed all aspects of the encounter. I agree with the assessment and plan as stated above. He has progressive HF symptoms now NYHA III-IIIB with worsening renal function. CPX borderline for advanced therapies. Agree with plan for RHC to assess for low output and need for inotropic support particularly to support renal function. Discussed with patient and his nephew, Logan Burton.   Daniel Bensimhon,MD 12:56 PM

## 2013-11-07 NOTE — Progress Notes (Signed)
  Echocardiogram 2D Echocardiogram has been performed.  Donata Clay 11/07/2013, 12:31 PM

## 2013-11-07 NOTE — Addendum Note (Signed)
Encounter addended by: Scarlette Calico, RN on: 11/07/2013  1:24 PM<BR>     Documentation filed: Patient Instructions Section, Orders

## 2013-11-14 ENCOUNTER — Ambulatory Visit
Admission: RE | Admit: 2013-11-14 | Discharge: 2013-11-14 | Disposition: A | Discharge status: Home / Self Care | Payer: BC Managed Care – PPO | Source: Ambulatory Visit | Attending: Interventional Radiology | Admitting: Interventional Radiology

## 2013-11-14 DIAGNOSIS — I701 Atherosclerosis of renal artery: Secondary | ICD-10-CM

## 2013-11-20 ENCOUNTER — Encounter: Payer: Self-pay | Admitting: Internal Medicine

## 2013-11-28 ENCOUNTER — Encounter: Payer: Self-pay | Admitting: Cardiovascular Disease

## 2013-11-28 ENCOUNTER — Ambulatory Visit (INDEPENDENT_AMBULATORY_CARE_PROVIDER_SITE_OTHER): Payer: BC Managed Care – PPO | Admitting: Cardiovascular Disease

## 2013-11-28 VITALS — BP 130/80 | HR 62 | Ht 72.0 in | Wt 195.0 lb

## 2013-11-28 DIAGNOSIS — I509 Heart failure, unspecified: Secondary | ICD-10-CM

## 2013-11-28 DIAGNOSIS — I10 Essential (primary) hypertension: Secondary | ICD-10-CM

## 2013-11-28 DIAGNOSIS — I5022 Chronic systolic (congestive) heart failure: Secondary | ICD-10-CM

## 2013-11-28 MED ORDER — HYDRALAZINE HCL 50 MG PO TABS: 50.0000 mg | ORAL_TABLET | Freq: Three times a day (TID) | ORAL | Status: DC

## 2013-11-28 NOTE — Assessment & Plan Note (Signed)
Logan Burton seems to be doing fairly well.  He has been started on hydralazine and imdur.  Seems to be tolerating it ok.  BP is high enough to increase his hydralazine to 50 TID. He will be seeing Dr. Haroldine Laws in a month.  We may be able to increase his Isosorbide at that time if needed.  He has a single chamber ICD.    Will see him in 6 months.

## 2013-11-28 NOTE — Patient Instructions (Signed)
Your physician has recommended you make the following change in your medication:  INCREASE HYDRALAZINE TO 50 MG 3 TIMES A DAY 8 HRS APART LIKE 6 AM 2 PM 10 PM  KEEP 3 MONTH APP WITH DR [place] wants you to follow-up in: [place] will receive a reminder letter in the mail two months in advance. If you don't receive a letter, please call our office to schedule the follow-up appointment.

## 2013-11-28 NOTE — Progress Notes (Signed)
Logan Burton Date of Birth  12/20/1955 [place] [address]    [place]   [place], [place]  [address]    [place], [place]  [address] [phone]  Fax  [phone]  [phone]  Fax [phone]  Problem List:  1.  Status post CABG 2007 2. 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 Congestive heart failure-EF of 45% 4. Hyperlipidemia  5. Constipation 6. Left renal artery stenosis - s/p stenting ( [facility]) 7. CKD - baseline Creatinine is 2.0  History of Present Illness:  Logan Burton is doing fairly well.   He continues to have episodes of shortness of breath particularly with any sort of exertion. His shortness breath has not worsened recently. He denies any episodes of chest pain. He is able to do all of his normal activities without any significant problems.  He still has significant dyspnea with heavy exertion.  He has been gradually getting stronger and stronger.  Jan 12, 2013:  Logan Burton continues to have some severe DOE.     Sept. 12, 2014:  Logan Burton is about the same.  He has been tried on an inhaler.  He had an echocardiogram in June, 2014. His son have an ejection fraction of around 30-35%.  He has been seen by Dr. Caryl Comes. Dr. Caryl Comes wanted him on additional medical therapy before considering an ICD. He has chronic kidney disease with a creatinine of around 1.8. We have avoided ACE inhibitors because of that creatinine. We have also been very cautious about starting Aldactone.   He had PFTs and was found to have mild COPD.    November 28, 2013:  Logan Burton has had an ICD placed since I last saw him.  He also has had a R heart cath by Bensimhon - revealed well compensated CHF.  Normal pressures.  He has started the disability process.  He still gets dizzy on occasion.  Still short of breath with normal daily activites (class III CHF)     Current Outpatient Prescriptions on File Prior to Visit  Medication Sig  Dispense Refill  . acetaminophen (TYLENOL) 325 MG tablet Take 1-2 tablets (325-650 mg total) by mouth every 4 (four) hours as needed for mild pain.      Marland Kitchen aspirin 325 MG tablet Take 325 mg by mouth daily.        Marland Kitchen atorvastatin (LIPITOR) 80 MG tablet Take 80 mg by mouth daily.      Marland Kitchen buPROPion (WELLBUTRIN SR) 150 MG 12 hr tablet Take 150 mg by mouth daily.      . digoxin (LANOXIN) 0.125 MG tablet Take 0.0625 mg by mouth every other day.      . escitalopram (LEXAPRO) 20 MG tablet Take 20 mg by mouth daily.       . fenofibrate (TRICOR) 145 MG tablet Take 145 mg by mouth daily.      . hydrALAZINE (APRESOLINE) 25 MG tablet Take 1.5 tablets (37.5 mg total) by mouth 3 (three) times daily.  135 tablet  3  . isosorbide mononitrate (IMDUR) 30 MG 24 hr tablet Take 1 tablet (30 mg total) by mouth 2 (two) times daily.  60 tablet  6  . metoprolol (TOPROL-XL) 200 MG 24 hr tablet Take 200 mg by mouth daily.      . Omega-3 Fatty Acids (FISH OIL) 600 MG CAPS Take 600 mg by mouth daily.       Marland Kitchen  Vitamin D, Ergocalciferol, (DRISDOL) 50000 UNITS CAPS Take 50,000 Units by mouth once a week. Every Wednesday       No current facility-administered medications on file prior to visit.    No Known Allergies  Past Medical History  Diagnosis Date  . Coronary artery disease     s/p CABG  . Myocardial infarction   . CHF (congestive heart failure)   . Chronic renal insufficiency   . PVD (peripheral vascular disease)   . Dyslipidemia     Past Surgical History  Procedure Laterality Date  . Coronary artery bypass graft    . Cervical fusion    . Hernia repair    . Angioplasty    . Implantable cardioverter defibrillator implant  10/15/13    MDT single chamber ICD implanted by Dr Caryl Comes for primary prevention    History  Smoking status  . Former Smoker -- 2.00 packs/day for 30 years  . Types: Cigarettes  . Quit date: 02/23/2006  Smokeless tobacco  . Never Used    History  Alcohol Use  . Yes    Comment: only  rare social occasions    No family history on file.  Reviw of Systems:  Reviewed in the HPI.  All other systems are negative.  Physical Exam: Blood pressure 130/80, pulse 62, height 6' (1.829 m), weight 195 lb (88.451 kg). General: Well developed, well nourished, in no acute distress.  Head: Normocephalic, atraumatic, sclera non-icteric, mucus membranes are moist,   Neck: Supple. Carotids are 2 + without bruits. No JVD  Lungs: Clear bilaterally to auscultation.  Heart: regular rate.  normal  S1 S2. No murmurs, gallops or rubs.  Abdomen: Soft, non-tender, non-distended with normal bowel sounds. No hepatomegaly. No rebound/guarding. No masses.  Msk:  Strength and tone are normal  Extremities: No clubbing or cyanosis. No edema.  Distal pedal pulses are 2+ and equal bilaterally.  Neuro: Alert and oriented X 3. Moves all extremities spontaneously.  Psych:  Responds to questions appropriately with a normal affect.  ECG:  Jan 12, 2013:  NSR at 45, RBBB, old lateral MI.    Assessment / Plan:

## 2013-11-28 NOTE — Assessment & Plan Note (Signed)
Continue aggressive Rx for his HTN and CHf.

## 2013-12-03 ENCOUNTER — Other Ambulatory Visit (HOSPITAL_COMMUNITY): Payer: Self-pay

## 2013-12-03 MED ORDER — HYDRALAZINE HCL 50 MG PO TABS: 50.0000 mg | ORAL_TABLET | Freq: Three times a day (TID) | ORAL | Status: DC

## 2013-12-06 ENCOUNTER — Other Ambulatory Visit: Payer: Self-pay | Admitting: Cardiology

## 2013-12-06 ENCOUNTER — Ambulatory Visit (HOSPITAL_COMMUNITY)
Admission: RE | Admit: 2013-12-06 | Discharge: 2013-12-06 | Disposition: A | Discharge status: Home / Self Care | Payer: BC Managed Care – PPO | Source: Ambulatory Visit | Attending: Cardiology | Admitting: Cardiology

## 2013-12-06 ENCOUNTER — Encounter (HOSPITAL_COMMUNITY): Payer: Self-pay | Admitting: *Deleted

## 2013-12-06 VITALS — BP 102/70 | HR 62 | Wt 195.2 lb

## 2013-12-06 DIAGNOSIS — I5022 Chronic systolic (congestive) heart failure: Secondary | ICD-10-CM | POA: Insufficient documentation

## 2013-12-06 DIAGNOSIS — I2510 Atherosclerotic heart disease of native coronary artery without angina pectoris: Secondary | ICD-10-CM | POA: Insufficient documentation

## 2013-12-06 DIAGNOSIS — I509 Heart failure, unspecified: Secondary | ICD-10-CM | POA: Insufficient documentation

## 2013-12-06 DIAGNOSIS — N189 Chronic kidney disease, unspecified: Secondary | ICD-10-CM | POA: Insufficient documentation

## 2013-12-06 MED ORDER — ISOSORBIDE MONONITRATE ER 30 MG PO TB24: 30.0000 mg | ORAL_TABLET | Freq: Two times a day (BID) | ORAL | Status: DC

## 2013-12-06 NOTE — Patient Instructions (Signed)
Labs in 1 month  Your physician recommends that you schedule a follow-up appointment in: 2 months

## 2013-12-07 NOTE — Progress Notes (Signed)
Patient ID: Logan Burton, male   DOB: 02-Aug-1956, 58 y.o.   MRN: [id number]  HPI: Logan Burton is a 57 year old with a history of ICM, S/P CABG 4010 , chronic systolic heart failure 09/7251 EF 30-35%, CRI creatinine baseline 2.0, L renal artery stenosis S/P stent 2013, atrophic right kidney , RBBB, PAD, and hyperlipidemia. He has not been on ACEI or spironolactone due CKD. Underwent Medtronic ICD on 10/15/13.   6/12/ 2014  Nuclear Stress test - LVEF 26% Inferior and inferolateral akinesis with the remainder LV hypokinetic.  02/07/2009 LHC LAD 70% stenosis in the mid vessel and 50% stenosis at the insertion of the left internal mammary artery. Circumflex occluded.  PFTs 02/02/13 FEV1 2.58 (66%) ratio 70 with FV curve c/w obst  CPX 07/05/13  Peak VO2: 15.4 (48.2% predicted peak VO2) VE/VCO2 slope: 41.7 OUES: 1.51 Peak RER: 1.16  09/17/2013 RA = 3  RV = 26/0/5  PA = 26/8 (16)  PCW = 7  Fick cardiac output/index = 4.5/2.2  Thermo CO/CI = 4.3/2.1  PVR = 1.0 WU  FA sat = 92%  PA sat = 64%, 66%  Echo 10/28/13 30%  Patient seems stable symptomatically.  Dyspnea varies, but typically short of breath walking up a flight of steps or walking 400-500 feet.  He has occasional relatively mild lightheadedness with standing.  He has not been on diuretics.  Creatinine has been stably elevated.  No chest pain.   Optivol was checked today. Fluid index is below threshold and thoracic impedence is stable.   Labs 6/14 LDL 105 Labs 05/18/13 K 4.4 Creatinine 1.8 Labs 09/07/13 K 4.2 Creatinine 2.0 Dig level 0.3  Labs 10/12/13 K 4.6 Cr 1.6 HCT 47.6  FH: No family members with cardiac history  SH: Divorced.  Lives alone.  Quit smoking 2007. Does not drink alcohol. Works full time to Futures trader  Past Medical History  Diagnosis Date  . Coronary artery disease     s/p CABG  . Myocardial infarction   . CHF (congestive heart failure)   . Chronic renal insufficiency   . PVD (peripheral vascular disease)    . Dyslipidemia     Current Outpatient Prescriptions  Medication Sig Dispense Refill  . acetaminophen (TYLENOL) 325 MG tablet Take 1-2 tablets (325-650 mg total) by mouth every 4 (four) hours as needed for mild pain.      Marland Kitchen aspirin 325 MG tablet Take 325 mg by mouth daily.        Marland Kitchen atorvastatin (LIPITOR) 80 MG tablet Take 80 mg by mouth daily.      Marland Kitchen buPROPion (WELLBUTRIN SR) 150 MG 12 hr tablet Take 150 mg by mouth daily.      . digoxin (LANOXIN) 0.125 MG tablet Take 0.0625 mg by mouth every other day.      . escitalopram (LEXAPRO) 20 MG tablet Take 20 mg by mouth daily.       . fenofibrate (TRICOR) 145 MG tablet Take 145 mg by mouth daily.      . hydrALAZINE (APRESOLINE) 50 MG tablet Take 1 tablet (50 mg total) by mouth 3 (three) times daily.  90 tablet  3  . isosorbide mononitrate (IMDUR) 30 MG 24 hr tablet Take 1 tablet (30 mg total) by mouth 2 (two) times daily.  180 tablet  6  . metoprolol (TOPROL-XL) 200 MG 24 hr tablet Take 200 mg by mouth daily.      . Omega-3 Fatty Acids (FISH OIL) 600 MG CAPS  Take 600 mg by mouth daily.       . Vitamin D, Ergocalciferol, (DRISDOL) 50000 UNITS CAPS Take 50,000 Units by mouth once a week. Every Wednesday       No current facility-administered medications for this encounter.     No Known Allergies  History   Social History  . Marital Status: Divorced    Spouse Name: N/A    Number of Children: N/A  . Years of Education: N/A   Occupational History  . [employer]    Social History Main Topics  . Smoking status: Former Smoker -- 2.00 packs/day for 30 years    Types: Cigarettes    Quit date: 02/23/2006  . Smokeless tobacco: Never Used  . Alcohol Use: Yes     Comment: only rare social occasions  . Drug Use: No  . Sexual Activity: Not Currently   Other Topics Concern  . Not on file   Social History Narrative  . No narrative on file    No family history on file.  PHYSICAL EXAM: Filed Vitals:   12/06/13 1142  BP: 102/70   Pulse: 62   General:  Well appearing. No respiratory difficulty Nephew present  HEENT: normal Neck: supple. no JVD. Carotids 2+ bilat; no bruits. No lymphadenopathy or thryomegaly appreciated. Cor: PMI nondisplaced. Regular rate & rhythm. No rubs, gallops or murmurs. Lungs: clear Abdomen: soft, nontender, nondistended. No hepatosplenomegaly. No bruits or masses. Good bowel sounds. Extremities: no cyanosis, clubbing, rash, edema Neuro: alert & oriented x 3, cranial nerves grossly intact. moves all 4 extremities w/o difficulty. Affect pleasant.  ASSESSMENT & PLAN:  1. Chronic Systolic Heart Failure: Ischemic cardiomyopathy, 11/07/12 EF 30%.  Overall stable NYHA III. CPX borderline for need for advanced therapies, reasonable cardiac output on right heart cath. Volume status looks good by exam and Optivol interrogation.  Will continue aggressive medical therapy.  If functional status gets worse would consider VAD evaluation. - Continue Toprol XL 200 mg daily and digoxin every other day.   - Continue current hydralazine/Imdur.  Hydralazine was recently increased and he is getting occasional lightheadedness with standing, so I will not increase again today.   - He is not on ACEI or spironolactone due to CKD.   - In one month, will check BMET and digoxin level.  He will followup here in 2 months.  If creatinine and K stable, would consider adding low-dose spironolactone.   2. CKD: Last creatinine 1.6. 3. L renal aretery stenosis S/P stent 2013. Atrophic R kidney. 4. CAD s/p CABG: No chest pain.  Continue statin, ASA.    Larey Dresser MD 12/07/2013

## 2014-01-22 ENCOUNTER — Ambulatory Visit (INDEPENDENT_AMBULATORY_CARE_PROVIDER_SITE_OTHER): Payer: BC Managed Care – PPO | Admitting: *Deleted

## 2014-01-22 ENCOUNTER — Encounter: Payer: Self-pay | Admitting: Internal Medicine

## 2014-01-22 ENCOUNTER — Ambulatory Visit (INDEPENDENT_AMBULATORY_CARE_PROVIDER_SITE_OTHER): Payer: BC Managed Care – PPO | Admitting: Internal Medicine

## 2014-01-22 VITALS — BP 117/77 | HR 65 | Ht 72.0 in | Wt 195.0 lb

## 2014-01-22 DIAGNOSIS — I5022 Chronic systolic (congestive) heart failure: Secondary | ICD-10-CM

## 2014-01-22 DIAGNOSIS — Z95810 Presence of automatic (implantable) cardiac defibrillator: Secondary | ICD-10-CM

## 2014-01-22 DIAGNOSIS — I509 Heart failure, unspecified: Secondary | ICD-10-CM

## 2014-01-22 DIAGNOSIS — I2589 Other forms of chronic ischemic heart disease: Secondary | ICD-10-CM

## 2014-01-22 DIAGNOSIS — I495 Sick sinus syndrome: Secondary | ICD-10-CM

## 2014-01-22 DIAGNOSIS — I498 Other specified cardiac arrhythmias: Secondary | ICD-10-CM

## 2014-01-22 DIAGNOSIS — I255 Ischemic cardiomyopathy: Secondary | ICD-10-CM

## 2014-01-22 HISTORY — DX: Other specified cardiac arrhythmias: I49.8

## 2014-01-22 HISTORY — DX: Sick sinus syndrome: I49.5

## 2014-01-22 LAB — MDC_IDC_ENUM_SESS_TYPE_INCLINIC
Battery Remaining Longevity: 136 mo
Battery Voltage: 3.12 V
Brady Statistic RV Percent Paced: 0.01 %
Date Time Interrogation Session: 20150609102042
HIGH POWER IMPEDANCE MEASURED VALUE: 228 Ohm
HIGH POWER IMPEDANCE MEASURED VALUE: 81 Ohm
Lead Channel Pacing Threshold Amplitude: 0.75 V
Lead Channel Setting Pacing Pulse Width: 0.4 ms
Lead Channel Setting Sensing Sensitivity: 0.3 mV
MDC IDC MSMT LEADCHNL RV IMPEDANCE VALUE: 494 Ohm
MDC IDC MSMT LEADCHNL RV PACING THRESHOLD PULSEWIDTH: 0.4 ms
MDC IDC MSMT LEADCHNL RV SENSING INTR AMPL: 7.875 mV
MDC IDC SET LEADCHNL RV PACING AMPLITUDE: 2.5 V
Zone Setting Detection Interval: 300 ms
Zone Setting Detection Interval: 340 ms
Zone Setting Detection Interval: 360 ms

## 2014-01-22 LAB — BASIC METABOLIC PANEL
BUN: 19 mg/dL (ref 6–23)
CO2: 25 mEq/L (ref 19–32)
Calcium: 9.9 mg/dL (ref 8.4–10.5)
Chloride: 108 mEq/L (ref 96–112)
Creatinine, Ser: 1.8 mg/dL — ABNORMAL HIGH (ref 0.4–1.5)
GFR: 40.59 mL/min — ABNORMAL LOW (ref >60.00)
GLUCOSE: 99 mg/dL (ref 70–99)
POTASSIUM: 4.9 meq/L (ref 3.5–5.1)
Sodium: 139 mEq/L (ref 135–145)

## 2014-01-22 NOTE — Progress Notes (Signed)
Patient Care Team: Florina Ou, MD as PCP - General (Family Medicine) Florina Ou, MD as Attending Physician (Family Medicine)   HPI  Logan Burton is a 58 y.o. male Seen in followup for an ICD implanted 3/15. At that time he underwent ICD implantation    he has significant shortness of breath with exertion. He occasionally has some chest discomfort with it.  He has not had any peripheral edema or orthopnea  Has history of ischemic heart disease with prior bypass surgery.6/12/ 2014 Nuclear Stress test - LVEF 26% Inferior and inferolateral akinesis with the remainder LV hypokinetic.  02/07/2009 LHC LAD 70% stenosis in the mid vessel and 50% stenosis at the insertion of the left internal mammary artery. Circumflex occluded.  PFTs 02/02/13 FEV1 2.58 (66%) ratio 70 with FV curve c/w obst  Last creatinine was 1.62/15 Past Medical History  Diagnosis Date  . Coronary artery disease     s/p CABG  . Myocardial infarction   . CHF (congestive heart failure)   . Chronic renal insufficiency   . PVD (peripheral vascular disease)   . Dyslipidemia   . 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 Chronotropic incompetence with sinus node dysfunction ? Iatrogenic 01/22/2014    Past Surgical History  Procedure Laterality Date  . Coronary artery bypass graft    . Cervical fusion    . Hernia repair    . Angioplasty    . Implantable cardioverter defibrillator implant  10/15/13    MDT single chamber ICD implanted by Dr Caryl Comes for primary prevention    Current Outpatient Prescriptions  Medication Sig Dispense Refill  . aspirin 325 MG tablet Take 325 mg by mouth daily.        Marland Kitchen atorvastatin (LIPITOR) 80 MG tablet Take 80 mg by mouth daily.      Marland Kitchen buPROPion (WELLBUTRIN SR) 150 MG 12 hr tablet Take 150 mg by mouth daily.      . digoxin (LANOXIN) 0.125 MG tablet Take 0.0625 mg by mouth every other day.      . escitalopram (LEXAPRO) 20 MG tablet Take 20 mg by mouth daily.         . fenofibrate (TRICOR) 145 MG tablet Take 145 mg by mouth daily.      . hydrALAZINE (APRESOLINE) 50 MG tablet Take 1 tablet (50 mg total) by mouth 3 (three) times daily.  90 tablet  3  . isosorbide mononitrate (IMDUR) 30 MG 24 hr tablet Take 1 tablet (30 mg total) by mouth 2 (two) times daily.  180 tablet  6  . metoprolol (TOPROL-XL) 200 MG 24 hr tablet Take 200 mg by mouth daily.      . Omega-3 Fatty Acids (FISH OIL) 600 MG CAPS Take 600 mg by mouth daily.       . Vitamin D, Ergocalciferol, (DRISDOL) 50000 UNITS CAPS Take 50,000 Units by mouth once a week. Every Wednesday       No current facility-administered medications for this visit.    No Known Allergies  Review of Systems negative except from HPI and PMH  Physical Exam BP 117/77  Pulse 65  Ht 6' (1.829 m)  Wt 195 lb (88.451 kg)  BMI 26.44 kg/m2 Well developed and well nourished in no acute distress HENT normal E scleral and icterus clear Neck Supple JVP flat; carotids brisk and full Clear to ausculation  [place] [employer] short of breath vice pocket well healed; without hematoma or erythema.  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 He has some occasional chest pain. A walk test in the office demonstrated he could only generator heart rate of about 75.  He is to see the heart failure clinic in 2 weeks. I would ask him to address 2 issues. The first is whether we can down titration of beta blocker to allow for greater heart rate excursion. The second is whether it  is appropriate to consider catheterization given the five-year interval any worsening symptoms.  His episodic dizziness and unassociated with any detected arrhythmias his device. Is not orthostatic.

## 2014-01-22 NOTE — Patient Instructions (Signed)
Your physician recommends that you continue on your current medications as directed. Please refer to the Current Medication list given to you today.  Remote monitoring is used to monitor your Pacemaker of ICD from home. This monitoring reduces the number of office visits required to check your device to one time per year. It allows us to keep an eye on the functioning of your device to ensure it is working properly. You are scheduled for a device check from home on 04/25/14. You may send your transmission at any time that day. If you have a wireless device, the transmission will be sent automatically. After your physician reviews your transmission, you will receive a postcard with your next transmission date.  Your physician wants you to follow-up in: 9 months with Dr. Klein. You will receive a reminder letter in the mail two months in advance. If you don't receive a letter, please call our office to schedule the follow-up appointment.  

## 2014-01-23 ENCOUNTER — Other Ambulatory Visit: Payer: Self-pay | Admitting: Cardiovascular Disease

## 2014-01-23 LAB — DIGOXIN LEVEL: Digoxin Level: 0.3 ng/mL — ABNORMAL LOW (ref 0.8–2.0)

## 2014-01-31 ENCOUNTER — Encounter: Payer: Self-pay | Admitting: Internal Medicine

## 2014-02-05 ENCOUNTER — Encounter (HOSPITAL_COMMUNITY): Payer: Self-pay

## 2014-02-05 ENCOUNTER — Ambulatory Visit (HOSPITAL_COMMUNITY)
Admission: RE | Admit: 2014-02-05 | Discharge: 2014-02-05 | Disposition: A | Discharge status: Home / Self Care | Payer: BC Managed Care – PPO | Source: Ambulatory Visit | Attending: Cardiology | Admitting: Cardiology

## 2014-02-05 VITALS — BP 104/62 | HR 66 | Wt 195.1 lb

## 2014-02-05 DIAGNOSIS — I25119 Atherosclerotic heart disease of native coronary artery with unspecified angina pectoris: Secondary | ICD-10-CM

## 2014-02-05 DIAGNOSIS — Z95810 Presence of automatic (implantable) cardiac defibrillator: Secondary | ICD-10-CM | POA: Insufficient documentation

## 2014-02-05 DIAGNOSIS — N189 Chronic kidney disease, unspecified: Secondary | ICD-10-CM | POA: Insufficient documentation

## 2014-02-05 DIAGNOSIS — E785 Hyperlipidemia, unspecified: Secondary | ICD-10-CM | POA: Insufficient documentation

## 2014-02-05 DIAGNOSIS — N269 Renal sclerosis, unspecified: Secondary | ICD-10-CM | POA: Insufficient documentation

## 2014-02-05 DIAGNOSIS — I209 Angina pectoris, unspecified: Secondary | ICD-10-CM

## 2014-02-05 DIAGNOSIS — I2510 Atherosclerotic heart disease of native coronary artery without angina pectoris: Secondary | ICD-10-CM

## 2014-02-05 DIAGNOSIS — I252 Old myocardial infarction: Secondary | ICD-10-CM | POA: Insufficient documentation

## 2014-02-05 DIAGNOSIS — Z7982 Long term (current) use of aspirin: Secondary | ICD-10-CM | POA: Insufficient documentation

## 2014-02-05 DIAGNOSIS — N183 Chronic kidney disease, stage 3 unspecified: Secondary | ICD-10-CM

## 2014-02-05 DIAGNOSIS — I5022 Chronic systolic (congestive) heart failure: Secondary | ICD-10-CM

## 2014-02-05 DIAGNOSIS — I2589 Other forms of chronic ischemic heart disease: Secondary | ICD-10-CM | POA: Insufficient documentation

## 2014-02-05 DIAGNOSIS — Z951 Presence of aortocoronary bypass graft: Secondary | ICD-10-CM | POA: Insufficient documentation

## 2014-02-05 DIAGNOSIS — Z87891 Personal history of nicotine dependence: Secondary | ICD-10-CM | POA: Insufficient documentation

## 2014-02-05 DIAGNOSIS — I509 Heart failure, unspecified: Secondary | ICD-10-CM

## 2014-02-05 DIAGNOSIS — I701 Atherosclerosis of renal artery: Secondary | ICD-10-CM | POA: Insufficient documentation

## 2014-02-05 DIAGNOSIS — I739 Peripheral vascular disease, unspecified: Secondary | ICD-10-CM | POA: Insufficient documentation

## 2014-02-05 DIAGNOSIS — Z79899 Other long term (current) drug therapy: Secondary | ICD-10-CM | POA: Insufficient documentation

## 2014-02-05 MED ORDER — ISOSORBIDE MONONITRATE ER 30 MG PO TB24: 60.0000 mg | ORAL_TABLET | Freq: Two times a day (BID) | ORAL | Status: DC

## 2014-02-05 MED ORDER — TIOTROPIUM BROMIDE MONOHYDRATE 18 MCG IN CAPS: 18.0000 ug | ORAL_CAPSULE | Freq: Every day | RESPIRATORY_TRACT | Status: DC

## 2014-02-05 MED ORDER — METOPROLOL SUCCINATE ER 50 MG PO TB24
150.0000 mg | ORAL_TABLET | Freq: Every day | ORAL | Status: DC
Start: 2014-02-05 — End: 2014-03-07

## 2014-02-05 MED ORDER — METOPROLOL SUCCINATE ER 50 MG PO TB24: 150.0000 mg | ORAL_TABLET | Freq: Every day | ORAL | Status: DC

## 2014-02-05 NOTE — Progress Notes (Signed)
Patient ID: DEANGLEO PASSAGE, male   DOB: November 12, 1955, 58 y.o.   MRN: [id number]  HPI: Logan Burton is a 58 year old with a history of ischemic cardiomyopathy S/P CABG 3846, chronic systolic heart failure 01/5992 echo EF 30-35%, CKD, L renal artery stenosis S/P stent 2013, atrophic right kidney , RBBB, PAD, and hyperlipidemia. He has not been on ACEI or spironolactone due CKD. Underwent Medtronic ICD placement on 10/15/13.   01/2013  Nuclear Stress test - LVEF 26% Inferior and inferolateral akinesis with the remainder LV hypokinetic.  01/2009 LHC LAD 70% stenosis in the mid vessel and 50% stenosis at the insertion of the left internal mammary artery. Circumflex occluded.  PFTs 02/02/13 FEV1 2.58 (66%), consistent with mild COPD  CPX 07/05/13  Peak VO2: 15.4 (48.2% predicted peak VO2) VE/VCO2 slope: 41.7 OUES: 1.51 Peak RER: 1.16  RHC 09/17/2013: RA = 3  RV = 26/0/5  PA = 26/8 (16)  PCW = 7  Fick cardiac output/index = 4.5/2.2  Thermo CO/CI = 4.3/2.1  PVR = 1.0 WU  FA sat = 92%  PA sat = 64%, 66%  Echo 10/28/13 30%  Patient seems stable symptomatically.  Dyspnea varies, but typically short of breath walking up a flight of steps or walking 400-500 feet.  No exertional chest pain.  He gets occasional chest tightness when lying in bed at night.  He has not been on diuretics.  Creatinine has been stably elevated.  No orthopnea/PND.  No palpitations.  He recently saw Dr. Caryl Comes.  It was noted that his HR did not increase appreciably with ambulation and there was concern that chronotropic incompetence on beta blocker could be playing a role in his exercise intolerance. Weight is stable.   Labs 6/14 LDL 105 Labs 05/18/13 K 4.4 Creatinine 1.8 Labs 09/07/13 K 4.2 Creatinine 2.0 Dig level 0.3  Labs 10/12/13 K 4.6 Cr 1.6 HCT 47.6 Labs 6/15 K 4.9, creatinine 1.8, digoxin 0.3  FH: No family members with cardiac history  SH: Divorced.  Lives alone.  Quit smoking 2007. Does not drink alcohol. Works full time to  sterilize surgical equipment  ROS: All systems reviewed and negative except as per HPI.   Past Medical History  Diagnosis Date  . Coronary artery disease     s/p CABG  . Myocardial infarction   . CHF (congestive heart failure)   . Chronic renal insufficiency   . PVD (peripheral vascular disease)   . Dyslipidemia   . 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 Chronotropic incompetence with sinus node dysfunction ? Iatrogenic 01/22/2014    Current Outpatient Prescriptions  Medication Sig Dispense Refill  . aspirin 325 MG tablet Take 325 mg by mouth daily.      Marland Kitchen atorvastatin (LIPITOR) 80 MG tablet Take 80 mg by mouth daily.      Marland Kitchen buPROPion (WELLBUTRIN SR) 150 MG 12 hr tablet Take 150 mg by mouth daily.      . digoxin (LANOXIN) 0.125 MG tablet Take 0.0625 mg by mouth every other day.      . escitalopram (LEXAPRO) 20 MG tablet Take 20 mg by mouth daily.       . fenofibrate (TRICOR) 145 MG tablet Take 145 mg by mouth daily.      . fenofibrate (TRICOR) 145 MG tablet TAKE 1 TABLET BY MOUTH EVERY DAY  90 tablet  0  . hydrALAZINE (APRESOLINE) 50 MG tablet Take 1 tablet (50 mg total) by mouth 3 (three) times daily.  90 tablet  3  . isosorbide mononitrate (IMDUR) 30 MG 24 hr tablet Take 2 tablets (60 mg total) by mouth 2 (two) times daily.  180 tablet  6  . Omega-3 Fatty Acids (FISH OIL) 600 MG CAPS Take 600 mg by mouth daily.       . Vitamin D, Ergocalciferol, (DRISDOL) 50000 UNITS CAPS Take 50,000 Units by mouth once a week. Every Wednesday      . metoprolol succinate (TOPROL XL) 50 MG 24 hr tablet Take 3 tablets (150 mg total) by mouth daily. Take with or immediately following a meal.  270 tablet  3  . tiotropium (SPIRIVA HANDIHALER) 18 MCG inhalation capsule Place 1 capsule (18 mcg total) into inhaler and inhale daily.  90 capsule  1   No current facility-administered medications for this encounter.   No Known Allergies  History   Social History  . Marital Status:  Divorced    Spouse Name: N/A    Number of Children: N/A  . Years of Education: N/A   Occupational History  . [employer]    Social History Main Topics  . Smoking status: Former Smoker -- 2.00 packs/day for 30 years    Types: Cigarettes    Quit date: 02/23/2006  . Smokeless tobacco: Never Used  . Alcohol Use: Yes     Comment: only rare social occasions  . Drug Use: No  . Sexual Activity: Not Currently   Other Topics Concern  . Not on file   Social History Narrative  . No narrative on file    No family history on file.  PHYSICAL EXAM: Filed Vitals:   02/05/14 1050  BP: 104/62  Pulse: 66   General:  Well appearing. No respiratory difficulty Nephew present  HEENT: normal Neck: supple. no JVD. Carotids 2+ bilat; no bruits. No lymphadenopathy or thryomegaly appreciated. Cor: PMI nondisplaced. Regular rate & rhythm. No rubs, gallops or murmurs. Lungs: clear Abdomen: soft, nontender, nondistended. No hepatosplenomegaly. No bruits or masses. Good bowel sounds. Extremities: no cyanosis, clubbing, rash, edema Neuro: alert & oriented x 3, cranial nerves grossly intact. moves all 4 extremities w/o difficulty. Affect pleasant.  ASSESSMENT & PLAN:  1. Chronic Systolic Heart Failure: Ischemic cardiomyopathy, 11/07/12 EF 30%.  Overall stable NYHA III symptoms. CPX in 11/14 borderline for need for advanced therapies, reasonable cardiac output on right heart cath in 2/15. Weight is stable and he does not look volume overloaded.  Will continue aggressive medical therapy.  If functional status gets worse would consider VAD evaluation.  He has a Medtronic ICD, not good CRT candidate with RBBB.  - Continue current digoxin, level ok when recently checked. - Given concern for chronotropic incompetence, I will decrease Toprol XL to 150 mg daily to see if this makes any difference in terms of exercise tolerance.  - Continue current hydralazine but increase Imdur to 60 mg daily.   - He is not  on ACEI or spironolactone due to CKD and high normal potassium.     2. CKD: Last creatinine 1.8 which is within his typical range.  Left renal artery stenosis s/p stent 2013. Atrophic right kidney. 4. CAD s/p CABG: No chest pain.  Continue statin, ASA. He has stable but limiting exertional symptoms (dyspnea, no chest pain).  Would try to avoid cardiac cath if at all possible given CKD.  I will have him do an ETT-Cardiolite to see if there is significant ischemia.    Loralie Champagne MD 02/05/2014

## 2014-02-05 NOTE — Patient Instructions (Signed)
DECREASE Toprol XL 150 mg daily INCREASE Imdur 60mg  START Spirvia inhaler, one puff daily  Your physician has requested that you have en exercise stress myoview. For further information please visit [email or web address]. Please follow instruction sheet, as given.  Your physician recommends that you schedule a follow-up appointment in: 1 month  Do the following things EVERYDAY: 1) Weigh yourself in the morning before breakfast. Write it down and keep it in a log. 2) Take your medicines as prescribed 3) Eat low salt foods-Limit salt (sodium) to 2000 mg per day.  4) Stay as active as you can everyday 5) Limit all fluids for the day to less than 2 liters 6)

## 2014-02-11 ENCOUNTER — Ambulatory Visit (HOSPITAL_COMMUNITY): Payer: BC Managed Care – PPO

## 2014-02-26 ENCOUNTER — Ambulatory Visit (HOSPITAL_COMMUNITY): Payer: BC Managed Care – PPO | Attending: Cardiology | Admitting: Radiology

## 2014-02-26 VITALS — BP 123/88 | Ht 72.0 in | Wt 195.0 lb

## 2014-02-26 DIAGNOSIS — R0989 Other specified symptoms and signs involving the circulatory and respiratory systems: Secondary | ICD-10-CM

## 2014-02-26 DIAGNOSIS — I25119 Atherosclerotic heart disease of native coronary artery with unspecified angina pectoris: Secondary | ICD-10-CM

## 2014-02-26 DIAGNOSIS — R0609 Other forms of dyspnea: Secondary | ICD-10-CM

## 2014-02-26 DIAGNOSIS — R0602 Shortness of breath: Secondary | ICD-10-CM

## 2014-02-26 DIAGNOSIS — R079 Chest pain, unspecified: Secondary | ICD-10-CM

## 2014-02-26 DIAGNOSIS — I2510 Atherosclerotic heart disease of native coronary artery without angina pectoris: Secondary | ICD-10-CM | POA: Insufficient documentation

## 2014-02-26 MED ORDER — REGADENOSON 0.4 MG/5ML IV SOLN
0.4000 mg | Freq: Once | INTRAVENOUS | Status: AC
Start: 2014-02-26 — End: 2014-02-26
  Administered 2014-02-26: 0.4 mg via INTRAVENOUS

## 2014-02-26 MED ORDER — TECHNETIUM TC 99M SESTAMIBI GENERIC - CARDIOLITE
33.0000 | Freq: Once | INTRAVENOUS | Status: AC | PRN
  Administered 2014-02-26: 33 via INTRAVENOUS

## 2014-02-26 MED ORDER — TECHNETIUM TC 99M SESTAMIBI GENERIC - CARDIOLITE
11.0000 | Freq: Once | INTRAVENOUS | Status: AC | PRN
  Administered 2014-02-26: 11 via INTRAVENOUS

## 2014-02-26 NOTE — Progress Notes (Addendum)
[facility] [address] [place], [place] [address] [phone]    Cardiology Nuclear Med Study  Logan Burton is a 58 y.o. male     MRN : [id number]     DOB: 11/06/1955  Procedure Date: 02/26/2014  Nuclear Med Background Indication for Stress Test:  Evaluation for Ischemia and Graft Patency History:  COPD and CAD-MI-CATH-CABG-AICD-CHF '14 MPI: EF: 26% inferolateral akinesis, ECHO: EF: 30-35% Cardiac Risk Factors: History of Smoking, Lipids, PVD and RBBB  Symptoms:  Chest Pain, Dizziness and DOE   Nuclear Pre-Procedure Caffeine/Decaff Intake:  None NPO After: 7:00pm   Lungs:  clear O2 Sat: 99% on room air. IV 0.9% NS with Angio Cath:  22g  IV Site: L Antecubital  IV Started by:  Perrin Maltese, EMT-P  Chest Size (in):  44 Cup Size: n/a  Height: 6' (1.829 m)  Weight:  195 lb (88.451 kg)  BMI:  Body mass index is 26.44 kg/(m^2). Tech Comments:  No Rx this am, he had extreme sob,chest tightness, and abnormal EKG. Pt pain free before leaving.    Nuclear Med Study 1 or 2 day study: 1 day  Stress Test Type:  Stress  Reading MD: Normajean Glasgow MD  Order Authorizing Provider:  D.McLean MD  Resting Radionuclide: Technetium 57m Sestamibi  Resting Radionuclide Dose: 11.0 mCi   Stress Radionuclide:  Technetium 41m Sestamibi  Stress Radionuclide Dose: 33.0 mCi           Stress Protocol Rest HR: 65 Stress HR: 117  Rest BP: 123/88 Stress BP: 156/85  Exercise Time (min): 7:52 METS: 7.0   Predicted Max HR: 160 bpm % Max HR: 73.12 bpm Rate Pressure Product: 18252   Dose of Adenosine (mg):  n/a Dose of Lexiscan: n/a mg  Dose of Atropine (mg): n/a Dose of Dobutamine: n/a mcg/kg/min (at max HR)  Stress Test Technologist: Perrin Maltese, EMT-P  Nuclear Technologist:  Vedia Pereyra, CNMT     Rest Procedure:  Myocardial perfusion imaging was performed at rest 45 minutes following the intravenous administration of Technetium 37m Sestamibi. Rest ECG:  RBBB  Stress Procedure:  The patient exercised on the treadmill utilizing the Bruce Protocol for 7:52 minutes. The patient stopped due to fatigue, sob, and chest . Technetium 33m Sestamibi was injected at peak exercise and myocardial perfusion imaging was performed after a brief delay. Stress ECG: Significant ST abnormalities consistent with ischemia.  QPS Raw Data Images:  Normal; no motion artifact; normal heart/lung ratio. Prominent RV uptake. Stress Images:  Severely decreased inferolateral uptake Rest Images:  Severely decreased inferolateral uptake Subtraction (SDS):  No evidence of ischemia. Transient Ischemic Dilatation (Normal <1.22):  0.98 Lung/Heart Ratio (Normal <0.45):  0.29  Quantitative Gated Spect Images QGS EDV:  121 ml QGS ESV:  75 ml  Impression Exercise Capacity:  Good exercise capacity. BP Response:  Normal blood pressure response. Clinical Symptoms:  5/10 chest pain ECG Impression:  Significant ST abnormalities consistent with ischemia. Comparison with Prior Nuclear Study: No significant change from previous study  Overall Impression:  Intermediate risk stress nuclear study with large fixed inferolateral scar. No reversible ischemia. Prominent RV uptake suggestive of increased LV filling pressure or pulmonary hypertension.  LV Ejection Fraction: 38%.  LV Wall Motion:  Inferolateral akinesis to dyskinesis  Pixie Casino, MD, [facility] Board Certified in Nuclear Cardiology Attending Cardiologist [facility]  Fixed defect consistent with prior MI, no evidence for ischemia.  Would hold off on cardiac cath.  Loralie Champagne 02/27/2014 1:11 PM

## 2014-02-27 NOTE — Progress Notes (Signed)
Pt aware of results 

## 2014-03-07 ENCOUNTER — Other Ambulatory Visit: Payer: Self-pay | Admitting: Cardiovascular Disease

## 2014-03-11 ENCOUNTER — Encounter (HOSPITAL_COMMUNITY): Payer: Self-pay

## 2014-03-11 ENCOUNTER — Ambulatory Visit (HOSPITAL_COMMUNITY)
Admission: RE | Admit: 2014-03-11 | Discharge: 2014-03-11 | Disposition: A | Discharge status: Home / Self Care | Payer: BC Managed Care – PPO | Source: Ambulatory Visit | Attending: Internal Medicine | Admitting: Internal Medicine

## 2014-03-11 VITALS — BP 102/78 | HR 63 | Wt 195.8 lb

## 2014-03-11 DIAGNOSIS — Z95810 Presence of automatic (implantable) cardiac defibrillator: Secondary | ICD-10-CM | POA: Insufficient documentation

## 2014-03-11 DIAGNOSIS — Z7982 Long term (current) use of aspirin: Secondary | ICD-10-CM | POA: Insufficient documentation

## 2014-03-11 DIAGNOSIS — Z87891 Personal history of nicotine dependence: Secondary | ICD-10-CM | POA: Insufficient documentation

## 2014-03-11 DIAGNOSIS — I129 Hypertensive chronic kidney disease with stage 1 through stage 4 chronic kidney disease, or unspecified chronic kidney disease: Secondary | ICD-10-CM | POA: Insufficient documentation

## 2014-03-11 DIAGNOSIS — Z951 Presence of aortocoronary bypass graft: Secondary | ICD-10-CM | POA: Insufficient documentation

## 2014-03-11 DIAGNOSIS — E785 Hyperlipidemia, unspecified: Secondary | ICD-10-CM | POA: Insufficient documentation

## 2014-03-11 DIAGNOSIS — I5022 Chronic systolic (congestive) heart failure: Secondary | ICD-10-CM | POA: Insufficient documentation

## 2014-03-11 DIAGNOSIS — N183 Chronic kidney disease, stage 3 unspecified: Secondary | ICD-10-CM | POA: Insufficient documentation

## 2014-03-11 DIAGNOSIS — I509 Heart failure, unspecified: Secondary | ICD-10-CM | POA: Insufficient documentation

## 2014-03-11 DIAGNOSIS — I701 Atherosclerosis of renal artery: Secondary | ICD-10-CM | POA: Insufficient documentation

## 2014-03-11 DIAGNOSIS — I4510 Unspecified right bundle-branch block: Secondary | ICD-10-CM | POA: Insufficient documentation

## 2014-03-11 DIAGNOSIS — I739 Peripheral vascular disease, unspecified: Secondary | ICD-10-CM | POA: Insufficient documentation

## 2014-03-11 DIAGNOSIS — I252 Old myocardial infarction: Secondary | ICD-10-CM | POA: Insufficient documentation

## 2014-03-11 DIAGNOSIS — I2510 Atherosclerotic heart disease of native coronary artery without angina pectoris: Secondary | ICD-10-CM

## 2014-03-11 DIAGNOSIS — I2589 Other forms of chronic ischemic heart disease: Secondary | ICD-10-CM | POA: Insufficient documentation

## 2014-03-11 DIAGNOSIS — I2584 Coronary atherosclerosis due to calcified coronary lesion: Secondary | ICD-10-CM

## 2014-03-11 DIAGNOSIS — I255 Ischemic cardiomyopathy: Secondary | ICD-10-CM

## 2014-03-11 LAB — BASIC METABOLIC PANEL
Anion gap: 15 (ref 5–15)
BUN: 20 mg/dL (ref 6–23)
CHLORIDE: 101 meq/L (ref 96–112)
CO2: 22 mEq/L (ref 19–32)
CREATININE: 1.81 mg/dL — AB (ref 0.50–1.35)
Calcium: 9.6 mg/dL (ref 8.4–10.5)
GFR calc Af Amer: 45 mL/min — ABNORMAL LOW (ref >90)
GFR calc non Af Amer: 39 mL/min — ABNORMAL LOW (ref >90)
Glucose, Bld: 99 mg/dL (ref 70–99)
POTASSIUM: 4.6 meq/L (ref 3.7–5.3)
Sodium: 138 mEq/L (ref 137–147)

## 2014-03-11 MED ORDER — HYDRALAZINE HCL 50 MG PO TABS: 50.0000 mg | ORAL_TABLET | Freq: Three times a day (TID) | ORAL | Status: DC

## 2014-03-11 MED ORDER — METOPROLOL SUCCINATE ER 50 MG PO TB24: 50.0000 mg | ORAL_TABLET | Freq: Every day | ORAL | Status: DC

## 2014-03-11 MED ORDER — DIGOXIN 125 MCG PO TABS: 0.0625 mg | ORAL_TABLET | ORAL | Status: DC

## 2014-03-11 MED ORDER — METOPROLOL SUCCINATE ER 100 MG PO TB24: 100.0000 mg | ORAL_TABLET | Freq: Every day | ORAL | Status: DC

## 2014-03-11 MED ORDER — ISOSORBIDE MONONITRATE ER 30 MG PO TB24: 60.0000 mg | ORAL_TABLET | Freq: Two times a day (BID) | ORAL | Status: DC

## 2014-03-11 NOTE — Progress Notes (Signed)
Patient ID: Logan Burton, male   DOB: December 03, 1955, 58 y.o.   MRN: [id number]  HPI: Logan Burton is a 58 year old with a history of ischemic cardiomyopathy S/P CABG 6440, chronic systolic heart failure 10/4740 echo EF 30-35%, CKD, L renal artery stenosis S/P stent 2013, atrophic right kidney , RBBB, PAD, and hyperlipidemia. He has not been on ACEI or spironolactone due CKD. Underwent Medtronic ICD placement on 10/15/13.    01/2009 LHC LAD 70% stenosis in the mid vessel and 50% stenosis at the insertion of the left internal mammary artery. Circumflex occluded.  01/2013  Nuclear Stress test - LVEF 26% Inferior and inferolateral akinesis with the remainder LV hypokinetic.   PFTs 02/02/13 FEV1 2.58 (66%), consistent with mild COPD  CPX 07/05/13  Peak VO2: 15.4 (48.2% predicted peak VO2) VE/VCO2 slope: 41.7 OUES: 1.51 Peak RER: 1.16  RHC 09/17/2013: RA = 3  RV = 26/0/5  PA = 26/8 (16)  PCW = 7  Fick cardiac output/index = 4.5/2.2  Thermo CO/CI = 4.3/2.1  PVR = 1.0 WU  FA sat = 92%  PA sat = 64%, 66%  Echo 10/28/13 30%  Myoview 7/15: Intermediate risk stress nuclear study with large fixed inferolateral scar. No reversible ischemia. Prominent RV uptake suggestive of increased LV filling pressure or pulmonary hypertension.  LV Ejection Fraction: 38%. LV Wall Motion: Inferolateral akinesis to dyskinesis  Patient seems stable symptomatically.  Dyspnea varies, but typically short of breath with mild activity. No real change.  No exertional chest pain.Dr. Aundra Dubin started spiriva last visit but no real improvement.  He has not been on diuretics. No edema. Creatinine has been stably elevated.  No orthopnea/PND.  No palpitations.  He recently saw Dr. Caryl Comes.  It was noted that his HR did not increase appreciably with ambulation and there was concern that chronotropic incompetence on beta blocker could be playing a role in his exercise intolerance. Toprol decreased 200->150 daily. Hydralazine increased to 50 tid. No  dizziness. Feels better since being on disability. Less fatigue.   Labs 6/14 LDL 105 Labs 05/18/13 K 4.4 Creatinine 1.8 Labs 09/07/13 K 4.2 Creatinine 2.0 Dig level 0.3  Labs 10/12/13 K 4.6 Cr 1.6 HCT 47.6 Labs 6/15 K 4.9, creatinine 1.8, digoxin 0.3  FH: No family members with cardiac history  SH: Divorced.  Lives alone.  Quit smoking 2007. Does not drink alcohol. Works full time to sterilize surgical equipment  ROS: All systems reviewed and negative except as per HPI.   Past Medical History  Diagnosis Date  . Coronary artery disease     s/p CABG  . Myocardial infarction   . CHF (congestive heart failure)   . Chronic renal insufficiency   . PVD (peripheral vascular disease)   . Dyslipidemia   . 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 Chronotropic incompetence with sinus node dysfunction ? Iatrogenic 01/22/2014    Current Outpatient Prescriptions  Medication Sig Dispense Refill  . aspirin 325 MG tablet Take 325 mg by mouth daily.      Marland Kitchen atorvastatin (LIPITOR) 80 MG tablet Take 80 mg by mouth daily.      Marland Kitchen buPROPion (WELLBUTRIN SR) 150 MG 12 hr tablet Take 150 mg by mouth daily.      . digoxin (LANOXIN) 0.125 MG tablet Take 0.0625 mg by mouth every other day.      . escitalopram (LEXAPRO) 20 MG tablet Take 20 mg by mouth daily.       . fenofibrate (TRICOR) 145 MG tablet  TAKE 1 TABLET BY MOUTH EVERY DAY  90 tablet  0  . hydrALAZINE (APRESOLINE) 50 MG tablet Take 1 tablet (50 mg total) by mouth 3 (three) times daily.  90 tablet  3  . isosorbide mononitrate (IMDUR) 30 MG 24 hr tablet Take 2 tablets (60 mg total) by mouth 2 (two) times daily.  180 tablet  6  . metoprolol (TOPROL-XL) 200 MG 24 hr tablet TAKE 1 TABLET BY MOUTH EVERY DAY  90 tablet  2  . Omega-3 Fatty Acids (FISH OIL) 600 MG CAPS Take 600 mg by mouth daily.       Marland Kitchen tiotropium (SPIRIVA HANDIHALER) 18 MCG inhalation capsule Place 1 capsule (18 mcg total) into inhaler and inhale daily.  90 capsule  1  .  Vitamin D, Ergocalciferol, (DRISDOL) 50000 UNITS CAPS Take 50,000 Units by mouth once a week. Every Wednesday       No current facility-administered medications for this encounter.   No Known Allergies  History   Social History  . Marital Status: Divorced    Spouse Name: N/A    Number of Children: N/A  . Years of Education: N/A   Occupational History  . [employer]    Social History Main Topics  . Smoking status: Former Smoker -- 2.00 packs/day for 30 years    Types: Cigarettes    Quit date: 02/23/2006  . Smokeless tobacco: Never Used  . Alcohol Use: Yes     Comment: only rare social occasions  . Drug Use: No  . Sexual Activity: Not Currently   Other Topics Concern  . Not on file   Social History Narrative  . No narrative on file    No family history on file.  PHYSICAL EXAM: Filed Vitals:   03/11/14 1129  BP: 102/78  Pulse: 63   General:  Well appearing. No respiratory difficulty Nephew present  HEENT: normal Neck: supple. no JVD. Carotids 2+ bilat; no bruits. No lymphadenopathy or thryomegaly appreciated. Cor: PMI nondisplaced. Regular rate & rhythm. No rubs, gallops or murmurs. Lungs: clear Abdomen: soft, nontender, nondistended. No hepatosplenomegaly. No bruits or masses. Good bowel sounds. Extremities: no cyanosis, clubbing, rash, edema Neuro: alert & oriented x 3, cranial nerves grossly intact. moves all 4 extremities w/o difficulty. Affect pleasant.  ASSESSMENT & PLAN:  1. Chronic Systolic Heart Failure: Ischemic cardiomyopathy, 11/07/12 EF 30%.  Overall stable NYHA III symptoms. CPX in 11/14 borderline for need for advanced therapies, reasonable cardiac output on right heart cath in 2/15. Weight is stable and he does not look volume overloaded.  Will continue aggressive medical therapy.  If functional status gets worse would consider VAD evaluation.  He has a Medtronic ICD, not good CRT candidate with RBBB.  - Continue current digoxin, level ok when  recently checked. - Continue current hydralazine/ Imdur - BP too soft to titrate  - He is not on ACEI or spironolactone due to CKD and high normal potassium.    - We discussed repeating CPX now but he says he feels ok and would like to wait. Will consider getting in the fall/winter or if he is getting worse.   2. CKD: Last creatinine 1.8 which is within his typical range.  Left renal artery stenosis s/p stent 2013. Atrophic right kidney. Check BMET today. 4. CAD s/p CABG: No chest pain.  Continue statin, ASA. Myoview reviewed with him. No ischemia   Glori Bickers MD 03/11/2014

## 2014-03-11 NOTE — Patient Instructions (Signed)
Return for follow up in 6 weeks.

## 2014-04-23 ENCOUNTER — Ambulatory Visit (HOSPITAL_COMMUNITY)
Admission: RE | Admit: 2014-04-23 | Discharge: 2014-04-23 | Disposition: A | Discharge status: Home / Self Care | Payer: PRIVATE HEALTH INSURANCE | Source: Ambulatory Visit | Attending: Cardiology | Admitting: Cardiology

## 2014-04-23 VITALS — BP 108/68 | HR 75 | Wt 195.8 lb

## 2014-04-23 DIAGNOSIS — I2589 Other forms of chronic ischemic heart disease: Secondary | ICD-10-CM | POA: Diagnosis not present

## 2014-04-23 DIAGNOSIS — N183 Chronic kidney disease, stage 3 unspecified: Secondary | ICD-10-CM | POA: Diagnosis not present

## 2014-04-23 DIAGNOSIS — J4489 Other specified chronic obstructive pulmonary disease: Secondary | ICD-10-CM | POA: Insufficient documentation

## 2014-04-23 DIAGNOSIS — F17200 Nicotine dependence, unspecified, uncomplicated: Secondary | ICD-10-CM | POA: Insufficient documentation

## 2014-04-23 DIAGNOSIS — J449 Chronic obstructive pulmonary disease, unspecified: Secondary | ICD-10-CM | POA: Insufficient documentation

## 2014-04-23 DIAGNOSIS — I5022 Chronic systolic (congestive) heart failure: Secondary | ICD-10-CM | POA: Insufficient documentation

## 2014-04-23 DIAGNOSIS — Z7982 Long term (current) use of aspirin: Secondary | ICD-10-CM | POA: Insufficient documentation

## 2014-04-23 DIAGNOSIS — I509 Heart failure, unspecified: Secondary | ICD-10-CM | POA: Diagnosis not present

## 2014-04-23 DIAGNOSIS — Z951 Presence of aortocoronary bypass graft: Secondary | ICD-10-CM | POA: Diagnosis not present

## 2014-04-23 DIAGNOSIS — I255 Ischemic cardiomyopathy: Secondary | ICD-10-CM

## 2014-04-23 DIAGNOSIS — I2510 Atherosclerotic heart disease of native coronary artery without angina pectoris: Secondary | ICD-10-CM | POA: Diagnosis not present

## 2014-04-23 NOTE — Patient Instructions (Signed)
Your physician has recommended that you have a cardiopulmonary stress test (CPX). CPX testing is a non-invasive measurement of heart and lung function. It replaces a traditional treadmill stress test. This type of test provides a tremendous amount of information that relates not only to your present condition but also for future outcomes. This test combines measurements of you ventilation, respiratory gas exchange in the lungs, electrocardiogram (EKG), blood pressure and physical response before, during, and following an exercise protocol.  Your physician recommends that you schedule a follow-up appointment in: 3 months  Do the following things EVERYDAY: 1) Weigh yourself in the morning before breakfast. Write it down and keep it in a log. 2) Take your medicines as prescribed 3) Eat low salt foods-Limit salt (sodium) to 2000 mg per day.  4) Stay as active as you can everyday 5) Limit all fluids for the day to less than 2 liters 6)

## 2014-04-23 NOTE — Progress Notes (Signed)
Patient ID: Logan Burton, male   DOB: 06-07-1956, 58 y.o.   MRN: [id number]  HPI: [name] is a 58 year old with a history of ischemic cardiomyopathy S/P CABG 7846, chronic systolic heart failure 04/6294 echo EF 30-35%, CKD, L renal artery stenosis S/P stent 2013, atrophic right kidney , RBBB, PAD, and hyperlipidemia. He has not been on ACEI or spironolactone due CKD. Underwent Medtronic ICD placement on 10/15/13.   01/2009 LHC LAD 70% stenosis in the mid vessel and 50% stenosis at the insertion of the left internal mammary artery. Circumflex occluded.  01/2013  Nuclear Stress test - LVEF 26% Inferior and inferolateral akinesis with the remainder LV hypokinetic.   PFTs 02/02/13 FEV1 2.58 (66%), consistent with mild COPD  CPX 07/05/13  Peak VO2: 15.4 (48.2% predicted peak VO2) VE/VCO2 slope: 41.7 OUES: 1.51 Peak RER: 1.16  RHC 09/17/2013: RA = 3  RV = 26/0/5  PA = 26/8 (16)  PCW = 7  Fick cardiac output/index = 4.5/2.2  Thermo CO/CI = 4.3/2.1  PVR = 1.0 WU  FA sat = 92%  PA sat = 64%, 66%  Echo 10/28/13 30%  Myoview 7/15: Intermediate risk stress nuclear study with large fixed inferolateral scar. No ischemia. Prominent RV uptake suggestive of increased LV filling pressure or pulmonary hypertension.  LV Ejection Fraction: 38%. LV Wall Motion: Inferolateral akinesis to dyskinesis  Patient seems stable symptomatically.  Dyspnea varies, but typically short of breath with walking 400-500 feet. No real change over the last few months.  No exertional chest pain.  Spiriva did not help so he stopped it. He has not been on diuretics. No edema. Creatinine has been stably elevated.  No orthopnea/PND.  No palpitations.  Toprol XL dose lowered to 150 mg daily due to chronotropic incompetence.  He has not noted much difference with the change.   Labs 6/14 LDL 105 Labs 05/18/13 K 4.4 Creatinine 1.8 Labs 09/07/13 K 4.2 Creatinine 2.0 Dig level 0.3  Labs 10/12/13 K 4.6 Cr 1.6 HCT 47.6 Labs 6/15 K 4.9, creatinine  1.8, digoxin 0.3 Labs 7/15 K 4.6, creatinine 1.8  FH: No family members with cardiac history  SH: Divorced.  Lives alone.  Quit smoking 2007. Does not drink alcohol. Works full time to sterilize surgical equipment  ROS: All systems reviewed and negative except as per HPI.   Past Medical History  Diagnosis Date  . Coronary artery disease     s/p CABG  . Myocardial infarction   . CHF (congestive heart failure)   . Chronic renal insufficiency   . PVD (peripheral vascular disease)   . Dyslipidemia   . 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 Chronotropic incompetence with sinus node dysfunction ? Iatrogenic 01/22/2014    Current Outpatient Prescriptions  Medication Sig Dispense Refill  . aspirin 325 MG tablet Take 325 mg by mouth daily.      Marland Kitchen atorvastatin (LIPITOR) 80 MG tablet Take 80 mg by mouth daily.      Marland Kitchen buPROPion (WELLBUTRIN SR) 150 MG 12 hr tablet Take 150 mg by mouth daily.      . digoxin (LANOXIN) 0.125 MG tablet Take 0.5 tablets (0.0625 mg total) by mouth every other day.  45 tablet  3  . escitalopram (LEXAPRO) 20 MG tablet Take 20 mg by mouth daily.       . fenofibrate (TRICOR) 145 MG tablet TAKE 1 TABLET BY MOUTH EVERY DAY  90 tablet  0  . hydrALAZINE (APRESOLINE) 50 MG tablet Take 1  tablet (50 mg total) by mouth 3 (three) times daily.  270 tablet  3  . isosorbide mononitrate (IMDUR) 30 MG 24 hr tablet Take 2 tablets (60 mg total) by mouth 2 (two) times daily.  540 tablet  3  . metoprolol (TOPROL-XL) 100 MG 24 hr tablet Take 1 tablet (100 mg total) by mouth daily.  90 tablet  3  . metoprolol succinate (TOPROL XL) 50 MG 24 hr tablet Take 1 tablet (50 mg total) by mouth daily. Take with or immediately following a meal.  90 tablet  3  . Omega-3 Fatty Acids (FISH OIL) 600 MG CAPS Take 600 mg by mouth daily.       . Vitamin D, Ergocalciferol, (DRISDOL) 50000 UNITS CAPS Take 50,000 Units by mouth once a week. Every Wednesday       No current  facility-administered medications for this encounter.   No Known Allergies  History   Social History  . Marital Status: Divorced    Spouse Name: N/A    Number of Children: N/A  . Years of Education: N/A   Occupational History  . [employer]    Social History Main Topics  . Smoking status: Former Smoker -- 2.00 packs/day for 30 years    Types: Cigarettes    Quit date: 02/23/2006  . Smokeless tobacco: Never Used  . Alcohol Use: Yes     Comment: only rare social occasions  . Drug Use: No  . Sexual Activity: Not Currently   Other Topics Concern  . Not on file   Social History Narrative  . No narrative on file    No family history on file.  PHYSICAL EXAM: Filed Vitals:   04/23/14 1104  BP: 108/68  Pulse: 75   General:  Well appearing. No respiratory difficulty Nephew present  HEENT: normal Neck: supple. no JVD. Carotids 2+ bilat; no bruits. No lymphadenopathy or thryomegaly appreciated. Cor: PMI nondisplaced. Regular rate & rhythm. No rubs, gallops or murmurs. Lungs: clear Abdomen: soft, nontender, nondistended. No hepatosplenomegaly. No bruits or masses. Good bowel sounds. Extremities: no cyanosis, clubbing, rash, edema Neuro: alert & oriented x 3, cranial nerves grossly intact. moves all 4 extremities w/o difficulty. Affect pleasant.  ASSESSMENT & PLAN:  1. Chronic Systolic Heart Failure: Ischemic cardiomyopathy, 11/07/12 EF 30%.  Overall stable NYHA III symptoms. CPX in 11/14 borderline for need for advanced therapies, reasonable cardiac output on right heart cath in 2/15. Weight is stable and he does not look volume overloaded.  Will continue aggressive medical therapy.  If functional status gets worse would consider VAD evaluation.  He has a Medtronic ICD, not good CRT candidate with RBBB.  - Continue current digoxin, level ok when recently checked. - Continue current hydralazine/ Imdur, BP remains too soft to titrate  - He is not on ACEI or spironolactone due  to CKD and high normal potassium.    - I will arrange for him to have repeat CPX in 11/15 to assess for any changes (will be 1 year since prior).    2. CKD: Last creatinine 1.8 which is within his typical range.  Left renal artery stenosis s/p stent 2013. Atrophic right kidney. BMET last week at nephrology office, will call for copy of labs. 4. CAD s/p CABG: No chest pain.  Continue statin, ASA. Recent Cardiolite showed infarction with no ischemia.    Loralie Champagne MD 04/23/2014

## 2014-04-25 ENCOUNTER — Ambulatory Visit (INDEPENDENT_AMBULATORY_CARE_PROVIDER_SITE_OTHER): Payer: BC Managed Care – PPO | Admitting: *Deleted

## 2014-04-25 DIAGNOSIS — I2589 Other forms of chronic ischemic heart disease: Secondary | ICD-10-CM

## 2014-04-25 DIAGNOSIS — I509 Heart failure, unspecified: Secondary | ICD-10-CM

## 2014-04-25 DIAGNOSIS — I255 Ischemic cardiomyopathy: Secondary | ICD-10-CM

## 2014-04-25 DIAGNOSIS — I5022 Chronic systolic (congestive) heart failure: Secondary | ICD-10-CM

## 2014-04-25 LAB — MDC_IDC_ENUM_SESS_TYPE_REMOTE
Battery Remaining Longevity: 135 mo
Battery Voltage: 3.08 V
Brady Statistic RV Percent Paced: 0 %
Date Time Interrogation Session: 20150910083824
HIGH POWER IMPEDANCE MEASURED VALUE: 209 Ohm
HighPow Impedance: 66 Ohm
Lead Channel Impedance Value: 456 Ohm
Lead Channel Pacing Threshold Amplitude: 0.625 V
Lead Channel Pacing Threshold Pulse Width: 0.4 ms
Lead Channel Sensing Intrinsic Amplitude: 9 mV
Lead Channel Setting Pacing Amplitude: 2.5 V
MDC IDC MSMT LEADCHNL RV SENSING INTR AMPL: 9 mV
MDC IDC SET LEADCHNL RV PACING PULSEWIDTH: 0.4 ms
MDC IDC SET LEADCHNL RV SENSING SENSITIVITY: 0.3 mV
MDC IDC SET ZONE DETECTION INTERVAL: 300 ms
MDC IDC SET ZONE DETECTION INTERVAL: 340 ms
MDC IDC SET ZONE DETECTION INTERVAL: 360 ms

## 2014-04-25 NOTE — Progress Notes (Signed)
Remote ICD transmission.   

## 2014-04-26 ENCOUNTER — Other Ambulatory Visit (HOSPITAL_COMMUNITY): Payer: Self-pay | Admitting: Cardiology

## 2014-04-26 DIAGNOSIS — I255 Ischemic cardiomyopathy: Secondary | ICD-10-CM

## 2014-04-26 MED ORDER — HYDRALAZINE HCL 50 MG PO TABS: 50.0000 mg | ORAL_TABLET | Freq: Three times a day (TID) | ORAL | Status: DC

## 2014-04-26 MED ORDER — METOPROLOL SUCCINATE ER 100 MG PO TB24: 100.0000 mg | ORAL_TABLET | Freq: Every day | ORAL | Status: DC

## 2014-04-26 MED ORDER — ISOSORBIDE MONONITRATE ER 30 MG PO TB24: 60.0000 mg | ORAL_TABLET | Freq: Two times a day (BID) | ORAL | Status: DC

## 2014-04-26 MED ORDER — ATORVASTATIN CALCIUM 80 MG PO TABS: 80.0000 mg | ORAL_TABLET | Freq: Every day | ORAL | Status: DC

## 2014-04-26 MED ORDER — METOPROLOL SUCCINATE ER 50 MG PO TB24: 50.0000 mg | ORAL_TABLET | Freq: Every day | ORAL | Status: DC

## 2014-04-26 MED ORDER — DIGOXIN 125 MCG PO TABS: 0.0625 mg | ORAL_TABLET | ORAL | Status: DC

## 2014-04-26 MED ORDER — FENOFIBRATE 145 MG PO TABS: ORAL_TABLET | ORAL | Status: DC

## 2014-05-13 ENCOUNTER — Other Ambulatory Visit (HOSPITAL_COMMUNITY): Payer: Self-pay

## 2014-05-13 DIAGNOSIS — I255 Ischemic cardiomyopathy: Secondary | ICD-10-CM

## 2014-05-13 MED ORDER — FENOFIBRATE 145 MG PO TABS: ORAL_TABLET | ORAL | Status: DC

## 2014-05-20 ENCOUNTER — Encounter: Payer: Self-pay | Admitting: Cardiology

## 2014-05-30 ENCOUNTER — Encounter: Payer: Self-pay | Admitting: Internal Medicine

## 2014-06-21 ENCOUNTER — Encounter (HOSPITAL_COMMUNITY): Payer: BC Managed Care – PPO

## 2014-07-23 ENCOUNTER — Encounter (HOSPITAL_COMMUNITY): Payer: BC Managed Care – PPO

## 2014-07-25 ENCOUNTER — Encounter (HOSPITAL_COMMUNITY): Payer: Self-pay | Admitting: Internal Medicine

## 2014-07-29 ENCOUNTER — Ambulatory Visit (INDEPENDENT_AMBULATORY_CARE_PROVIDER_SITE_OTHER): Payer: PRIVATE HEALTH INSURANCE | Admitting: *Deleted

## 2014-07-29 DIAGNOSIS — I255 Ischemic cardiomyopathy: Secondary | ICD-10-CM

## 2014-07-29 DIAGNOSIS — I5022 Chronic systolic (congestive) heart failure: Secondary | ICD-10-CM

## 2014-07-29 LAB — MDC_IDC_ENUM_SESS_TYPE_REMOTE
Battery Remaining Longevity: 134 mo
Battery Voltage: 3.05 V
Brady Statistic RV Percent Paced: 0.01 %
HIGH POWER IMPEDANCE MEASURED VALUE: 81 Ohm
Lead Channel Impedance Value: 494 Ohm
Lead Channel Pacing Threshold Amplitude: 0.625 V
Lead Channel Pacing Threshold Pulse Width: 0.4 ms
Lead Channel Sensing Intrinsic Amplitude: 8.875 mV
Lead Channel Setting Pacing Amplitude: 2.5 V
Lead Channel Setting Sensing Sensitivity: 0.3 mV
MDC IDC MSMT LEADCHNL RV IMPEDANCE VALUE: 399 Ohm
MDC IDC MSMT LEADCHNL RV SENSING INTR AMPL: 8.875 mV
MDC IDC SESS DTM: 20151214062504
MDC IDC SET LEADCHNL RV PACING PULSEWIDTH: 0.4 ms
MDC IDC SET ZONE DETECTION INTERVAL: 300 ms
MDC IDC SET ZONE DETECTION INTERVAL: 360 ms
Zone Setting Detection Interval: 340 ms

## 2014-07-29 NOTE — Progress Notes (Signed)
Remote ICD transmission.   

## 2014-08-22 ENCOUNTER — Encounter: Payer: Self-pay | Admitting: Cardiology

## 2014-08-27 ENCOUNTER — Encounter: Payer: Self-pay | Admitting: Internal Medicine

## 2014-09-09 ENCOUNTER — Telehealth (HOSPITAL_COMMUNITY): Payer: Self-pay | Admitting: Vascular Surgery

## 2014-09-09 DIAGNOSIS — I255 Ischemic cardiomyopathy: Secondary | ICD-10-CM

## 2014-09-09 MED ORDER — ISOSORBIDE MONONITRATE ER 30 MG PO TB24: 60.0000 mg | ORAL_TABLET | Freq: Two times a day (BID) | ORAL | Status: DC

## 2014-09-09 NOTE — Telephone Encounter (Signed)
Refill Isosorbide 90 day supply.. Due to [employer] pt want presription sent to [facility]

## 2014-09-13 ENCOUNTER — Encounter (HOSPITAL_COMMUNITY): Payer: PRIVATE HEALTH INSURANCE

## 2014-09-17 ENCOUNTER — Encounter (HOSPITAL_COMMUNITY): Payer: PRIVATE HEALTH INSURANCE

## 2014-10-03 ENCOUNTER — Ambulatory Visit (HOSPITAL_COMMUNITY): Payer: 59 | Attending: Internal Medicine

## 2014-10-03 DIAGNOSIS — I255 Ischemic cardiomyopathy: Secondary | ICD-10-CM

## 2014-10-03 DIAGNOSIS — R0609 Other forms of dyspnea: Secondary | ICD-10-CM | POA: Diagnosis present

## 2014-10-04 NOTE — Addendum Note (Signed)
Encounter addended by: Scarlette Calico, RN on: 10/04/2014  8:32 AM<BR>     Documentation filed: [employer], Orders

## 2014-10-10 ENCOUNTER — Ambulatory Visit (HOSPITAL_COMMUNITY)
Admission: RE | Admit: 2014-10-10 | Discharge: 2014-10-10 | Disposition: A | Discharge status: Home / Self Care | Payer: 59 | Source: Ambulatory Visit | Attending: Internal Medicine | Admitting: Internal Medicine

## 2014-10-10 VITALS — BP 92/66 | HR 71 | Wt 197.8 lb

## 2014-10-10 DIAGNOSIS — I2510 Atherosclerotic heart disease of native coronary artery without angina pectoris: Secondary | ICD-10-CM | POA: Insufficient documentation

## 2014-10-10 DIAGNOSIS — J449 Chronic obstructive pulmonary disease, unspecified: Secondary | ICD-10-CM | POA: Insufficient documentation

## 2014-10-10 DIAGNOSIS — N183 Chronic kidney disease, stage 3 unspecified: Secondary | ICD-10-CM

## 2014-10-10 DIAGNOSIS — Z951 Presence of aortocoronary bypass graft: Secondary | ICD-10-CM | POA: Diagnosis not present

## 2014-10-10 DIAGNOSIS — I5022 Chronic systolic (congestive) heart failure: Secondary | ICD-10-CM | POA: Diagnosis not present

## 2014-10-10 DIAGNOSIS — N189 Chronic kidney disease, unspecified: Secondary | ICD-10-CM | POA: Insufficient documentation

## 2014-10-10 DIAGNOSIS — I255 Ischemic cardiomyopathy: Secondary | ICD-10-CM

## 2014-10-10 LAB — LIPID PANEL
CHOL/HDL RATIO: 4.4 ratio
Cholesterol: 171 mg/dL (ref 0–200)
HDL: 39 mg/dL — AB (ref >39)
LDL CALC: 106 mg/dL — AB (ref 0–99)
TRIGLYCERIDES: 129 mg/dL (ref <150)
VLDL: 26 mg/dL (ref 0–40)

## 2014-10-10 LAB — BASIC METABOLIC PANEL
Anion gap: 5 (ref 5–15)
BUN: 13 mg/dL (ref 6–23)
CO2: 24 mmol/L (ref 19–32)
CREATININE: 1.89 mg/dL — AB (ref 0.50–1.35)
Calcium: 9.3 mg/dL (ref 8.4–10.5)
Chloride: 106 mmol/L (ref 96–112)
GFR calc Af Amer: 43 mL/min — ABNORMAL LOW (ref >90)
GFR calc non Af Amer: 37 mL/min — ABNORMAL LOW (ref >90)
Glucose, Bld: 119 mg/dL — ABNORMAL HIGH (ref 70–99)
Potassium: 4.5 mmol/L (ref 3.5–5.1)
Sodium: 135 mmol/L (ref 135–145)

## 2014-10-10 LAB — DIGOXIN LEVEL: Digoxin Level: <0.2 ng/mL — ABNORMAL LOW (ref 0.8–2.0)

## 2014-10-10 MED ORDER — ISOSORBIDE MONONITRATE ER 30 MG PO TB24: 60.0000 mg | ORAL_TABLET | Freq: Two times a day (BID) | ORAL | Status: DC

## 2014-10-10 MED ORDER — ATORVASTATIN CALCIUM 80 MG PO TABS: 80.0000 mg | ORAL_TABLET | Freq: Every day | ORAL | Status: DC

## 2014-10-10 MED ORDER — FENOFIBRATE 145 MG PO TABS: ORAL_TABLET | ORAL | Status: DC

## 2014-10-10 NOTE — Patient Instructions (Signed)
Labs today  DECREASE Toprol to 100 mg daily  Your physician recommends that you schedule a follow-up appointment in: 3 months

## 2014-10-11 NOTE — Progress Notes (Signed)
Patient ID: Logan Burton, male   DOB: 1956/01/10, 59 y.o.   MRN: [id number]  HPI: Mr Lehrmann is a 59 year old with a history of ischemic cardiomyopathy S/P CABG 3614, chronic systolic heart failure 11/3152 echo EF 30-35%, CKD, L renal artery stenosis S/P stent 2013, atrophic right kidney , RBBB, PAD, and hyperlipidemia. He has not been on ACEI or spironolactone due CKD. Underwent Medtronic ICD placement on 10/15/13.   01/2009 LHC LAD 70% stenosis in the mid vessel and 50% stenosis at the insertion of the left internal mammary artery. Circumflex occluded.  01/2013  Nuclear Stress test - LVEF 26% Inferior and inferolateral akinesis with the remainder LV hypokinetic.   PFTs 02/02/13 FEV1 2.58 (66%), consistent with mild COPD  CPX 07/05/13  Peak VO2: 15.4 (48.2% predicted peak VO2) VE/VCO2 slope: 41.7 OUES: 1.51 Peak RER: 1.16  RHC 09/17/2013: RA = 3  RV = 26/0/5  PA = 26/8 (16)  PCW = 7  Fick cardiac output/index = 4.5/2.2  Thermo CO/CI = 4.3/2.1  PVR = 1.0 WU  FA sat = 92%  PA sat = 64%, 66%  Echo 10/28/13 30%  Myoview 7/15: Intermediate risk stress nuclear study with large fixed inferolateral scar. No ischemia. Prominent RV uptake suggestive of increased LV filling pressure or pulmonary hypertension.  LV Ejection Fraction: 38%. LV Wall Motion: Inferolateral akinesis to dyskinesis  CPX (2/16): Peak VO2 15.4 VE/VCO2 45.8 RER 1.07 FEV1 58% FVC 72% Moderate functional impairment, moderate COPD, chronotropic incompetence noted.   Patient seems stable symptomatically.  CPX in 2/16 was very similar to the prior CPX with moderate functional limitation due to a combination of heart and lung disease. Dyspnea varies, but typically short of breath with walking 400-500 feet.  No exertional chest pain.  Spiriva did not help so he stopped it. He has not been on diuretics. No edema. Creatinine has been stably elevated.  No orthopnea/PND.  No palpitations.  Toprol XL dose lowered to 150 mg daily due to  chronotropic incompetence.  He has not noted much difference with the change. He was again noted to have chronotropic incompetence on CPX.   Optivol was checked today.  Fluid index < threshold, thoracic impedance stable.   Labs 6/14 LDL 105 Labs 05/18/13 K 4.4 Creatinine 1.8 Labs 09/07/13 K 4.2 Creatinine 2.0 Dig level 0.3  Labs 10/12/13 K 4.6 Cr 1.6 HCT 47.6 Labs 6/15 K 4.9, creatinine 1.8, digoxin 0.3 Labs 7/15 K 4.6, creatinine 1.8 Labs 9/15 creatinine 1.65  FH: No family members with cardiac history  SH: Divorced.  Lives alone.  Quit smoking 2007. Does not drink alcohol. Works full time to sterilize surgical equipment  ROS: All systems reviewed and negative except as per HPI.   Past Medical History  Diagnosis Date  . Coronary artery disease     s/p CABG  . Myocardial infarction   . CHF (congestive heart failure)   . Chronic renal insufficiency   . PVD (peripheral vascular disease)   . Dyslipidemia   . 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 Chronotropic incompetence with sinus node dysfunction ? Iatrogenic 01/22/2014    Current Outpatient Prescriptions  Medication Sig Dispense Refill  . aspirin 325 MG tablet Take 325 mg by mouth daily.    Marland Kitchen atorvastatin (LIPITOR) 80 MG tablet Take 1 tablet (80 mg total) by mouth daily. 90 tablet 3  . buPROPion (WELLBUTRIN SR) 150 MG 12 hr tablet Take 150 mg by mouth daily.    . digoxin (LANOXIN) 0.125  MG tablet Take 0.5 tablets (0.0625 mg total) by mouth every other day. 45 tablet 3  . escitalopram (LEXAPRO) 20 MG tablet Take 20 mg by mouth daily.     . fenofibrate (TRICOR) 145 MG tablet TAKE 1 TABLET BY MOUTH EVERY DAY 90 tablet 3  . hydrALAZINE (APRESOLINE) 50 MG tablet Take 1 tablet (50 mg total) by mouth 3 (three) times daily. 270 tablet 3  . isosorbide mononitrate (IMDUR) 30 MG 24 hr tablet Take 2 tablets (60 mg total) by mouth 2 (two) times daily. 360 tablet 3  . metoprolol succinate (TOPROL-XL) 100 MG 24 hr tablet  Take 1 tablet (100 mg total) by mouth daily. 90 tablet 3  . Omega-3 Fatty Acids (FISH OIL) 600 MG CAPS Take 600 mg by mouth daily.     . Vitamin D, Ergocalciferol, (DRISDOL) 50000 UNITS CAPS Take 50,000 Units by mouth once a week. Every Wednesday     No current facility-administered medications for this encounter.   No Known Allergies  History   Social History  . Marital Status: Divorced    Spouse Name: N/A  . Number of Children: N/A  . Years of Education: N/A   Occupational History  . [employer]    Social History Main Topics  . Smoking status: Former Smoker -- 2.00 packs/day for 30 years    Types: Cigarettes    Quit date: 02/23/2006  . Smokeless tobacco: Never Used  . Alcohol Use: Yes     Comment: only rare social occasions  . Drug Use: No  . Sexual Activity: Not Currently   Other Topics Concern  . Not on file   Social History Narrative    No family history on file.  PHYSICAL EXAM: Filed Vitals:   10/10/14 1124  BP: 92/66  Pulse: 71   General:  Well appearing. No respiratory difficulty Nephew present  HEENT: normal Neck: supple. no JVD. Carotids 2+ bilat; no bruits. No lymphadenopathy or thryomegaly appreciated. Cor: PMI nondisplaced. Regular rate & rhythm. No rubs, gallops or murmurs. Lungs: clear Abdomen: soft, nontender, nondistended. No hepatosplenomegaly. No bruits or masses. Good bowel sounds. Extremities: no cyanosis, clubbing, rash, edema Neuro: alert & oriented x 3, cranial nerves grossly intact. moves all 4 extremities w/o difficulty. Affect pleasant.  ASSESSMENT & PLAN:  1. Chronic Systolic Heart Failure: Ischemic cardiomyopathy, 11/07/12 EF 30%.  Overall stable NYHA III symptoms. Reasonable cardiac output on right heart cath in 2/15. CPX 2/16 showed chronotropoic incompetence and moderate functional impairment likely due to a combination of cardiac and pulmonary disease.  Weight is stable and he does not look volume overloaded.  Optivol suggests  no significant volume overload.  Will continue aggressive medical therapy.  If functional status gets worse would consider VAD evaluation.  He has a Medtronic ICD, not good CRT candidate with RBBB.  - Continue current digoxin, check level today.  - Continue current hydralazine/ Imdur, BP remains too soft to titrate  - He is not on ACEI or spironolactone due to CKD and high normal potassium.    - With chronotropic incompetence noted on CPX again, I will have him decrease Toprol XL to 100 mg daily.  2. CKD:  Left renal artery stenosis s/p stent 2013. Atrophic right kidney. Patient has followup with nephrology.  BMET today.  3. CAD s/p CABG: No chest pain.  Continue statin, ASA. Last Cardiolite showed infarction with no ischemia. Check lipids. 4. COPD: Suspect moderate COPD.  Spiriva did not help his breathing.  Loralie Champagne MD 10/11/2014

## 2014-10-14 ENCOUNTER — Telehealth (HOSPITAL_COMMUNITY): Payer: Self-pay | Admitting: *Deleted

## 2014-10-14 MED ORDER — EZETIMIBE 10 MG PO TABS: 10.0000 mg | ORAL_TABLET | Freq: Every day | ORAL | Status: DC

## 2014-10-14 NOTE — Telephone Encounter (Signed)
-----   Message from Larey Dresser, MD sent at 10/11/2014  3:02 PM EST ----- LDL higher than goal (< 70).  If he is taking atorvastatin 80 mg daily, would have him add Zetia 10 mg daily with lipids/LFTs in 2 months.

## 2014-10-14 NOTE — Telephone Encounter (Signed)
Pt aware, rx sent in 

## 2014-10-28 ENCOUNTER — Other Ambulatory Visit (HOSPITAL_COMMUNITY): Payer: Self-pay | Admitting: Interventional Radiology

## 2014-10-28 ENCOUNTER — Telehealth: Payer: Self-pay | Admitting: *Deleted

## 2014-10-28 DIAGNOSIS — I701 Atherosclerosis of renal artery: Secondary | ICD-10-CM

## 2014-10-28 NOTE — Telephone Encounter (Signed)
lmom for pt to call and schedule f/u US and visit Dr Barbie Banner

## 2014-10-31 ENCOUNTER — Encounter: Payer: Self-pay | Admitting: Internal Medicine

## 2014-10-31 ENCOUNTER — Other Ambulatory Visit (HOSPITAL_COMMUNITY): Payer: Self-pay

## 2014-10-31 DIAGNOSIS — I255 Ischemic cardiomyopathy: Secondary | ICD-10-CM

## 2014-10-31 MED ORDER — FENOFIBRATE 160 MG PO TABS: ORAL_TABLET | ORAL | Status: DC

## 2014-11-04 ENCOUNTER — Telehealth: Payer: Self-pay | Admitting: *Deleted

## 2014-11-04 NOTE — Telephone Encounter (Signed)
11/04/14 lmom to schedule f/u appts

## 2014-11-06 ENCOUNTER — Telehealth (HOSPITAL_COMMUNITY): Payer: Self-pay | Admitting: *Deleted

## 2014-11-06 NOTE — Telephone Encounter (Signed)
Called [employer] at [phone] for PA on Fenofibrate (pt's ID [id number]) med approved through 11/06/15, [facility] aware

## 2015-01-01 ENCOUNTER — Ambulatory Visit
Admission: RE | Admit: 2015-01-01 | Discharge: 2015-01-01 | Disposition: A | Discharge status: Home / Self Care | Payer: 59 | Source: Ambulatory Visit | Attending: Interventional Radiology | Admitting: Interventional Radiology

## 2015-01-01 DIAGNOSIS — I701 Atherosclerosis of renal artery: Secondary | ICD-10-CM

## 2015-01-01 NOTE — Progress Notes (Signed)
Patient ID: Logan Burton, male   DOB: 30-Dec-1955, 59 y.o.   MRN: [id number]    Chief Complaint: No chief complaint on file.   Referring Physician(s): Quanna Wittke  History of Present Illness: SEYMOUR PAVLAK is a 58 y.o. male who returns for follow-up of left renal artery stenting and renal artery stenosis. His blood pressure has been stable within the normal range. His medical regimen remains stable consisting of hydralazine, Toprol, and Imdur. He denies any lightheadedness or episodes of syncope. His last creatinine performed February of this year was 1.89. He has had interval blood work with his nephrologist. He is here today for his renal Doppler study  Past Medical History  Diagnosis Date  . Coronary artery disease     s/p CABG  . Myocardial infarction   . CHF (congestive heart failure)   . Chronic renal insufficiency   . PVD (peripheral vascular disease)   . Dyslipidemia   . 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 Chronotropic incompetence with sinus node dysfunction ? Iatrogenic 01/22/2014    Past Surgical History  Procedure Laterality Date  . Coronary artery bypass graft    . Cervical fusion    . Hernia repair    . Angioplasty    . Implantable cardioverter defibrillator implant  10/15/13    MDT single chamber ICD implanted by Dr Caryl Comes for primary prevention  . Right heart catheterization N/A 09/20/2013    Procedure: RIGHT HEART CATH;  Surgeon: Jolaine Artist, MD;  Location: [facility] CATH LAB;  Service: Cardiovascular;  Laterality: N/A;  . Implantable cardioverter defibrillator implant N/A 10/15/2013    Procedure: IMPLANTABLE CARDIOVERTER DEFIBRILLATOR IMPLANT;  Surgeon: Deboraha Sprang, MD;  Location: [facility] CATH LAB;  Service: Cardiovascular;  Laterality: N/A;    Allergies: Review of patient's allergies indicates no known allergies.  Medications: Prior to Admission medications   Medication Sig Start Date End Date Taking? Authorizing Provider  aspirin 325 MG  tablet Take 325 mg by mouth daily.   Yes Historical Provider, MD  atorvastatin (LIPITOR) 80 MG tablet Take 1 tablet (80 mg total) by mouth daily. 10/10/14  Yes Larey Dresser, MD  buPROPion [facility] SR) 150 MG 12 hr tablet Take 150 mg by mouth daily.   Yes Historical Provider, MD  digoxin (LANOXIN) 0.125 MG tablet Take 0.5 tablets (0.0625 mg total) by mouth every other day. 04/26/14  Yes Shaune Pascal Bensimhon, MD  escitalopram (LEXAPRO) 20 MG tablet Take 20 mg by mouth daily.  12/18/12  Yes Historical Provider, MD  ezetimibe (ZETIA) 10 MG tablet Take 1 tablet (10 mg total) by mouth daily. 10/14/14  Yes Larey Dresser, MD  fenofibrate 160 MG tablet TAKE 1 TABLET BY MOUTH EVERY DAY 10/31/14  Yes Larey Dresser, MD  hydrALAZINE (APRESOLINE) 50 MG tablet Take 1 tablet (50 mg total) by mouth 3 (three) times daily. 04/26/14  Yes Jolaine Artist, MD  isosorbide mononitrate (IMDUR) 30 MG 24 hr tablet Take 2 tablets (60 mg total) by mouth 2 (two) times daily. 10/10/14  Yes Larey Dresser, MD  metoprolol succinate (TOPROL-XL) 100 MG 24 hr tablet Take 1 tablet (100 mg total) by mouth daily. 04/26/14  Yes Jolaine Artist, MD  Omega-3 Fatty Acids (FISH OIL) 600 MG CAPS Take 600 mg by mouth daily.    Yes Historical Provider, MD  Vitamin D, Ergocalciferol, (DRISDOL) 50000 UNITS CAPS Take 50,000 Units by mouth once a week. Every Wednesday 11/23/12  Yes Historical Provider, MD  No family history on file.  History   Social History  . Marital Status: Divorced    Spouse Name: N/A  . Number of Children: N/A  . Years of Education: N/A   Occupational History  . [employer]    Social History Main Topics  . Smoking status: Former Smoker -- 2.00 packs/day for 30 years    Types: Cigarettes    Quit date: 02/23/2006  . Smokeless tobacco: Never Used  . Alcohol Use: Yes     Comment: only rare social occasions  . Drug Use: No  . Sexual Activity: Not Currently   Other Topics Concern  . Not on file    Social History Narrative     Review of Systems: A 12 point ROS discussed and pertinent positives are indicated in the HPI above.  All other systems are negative.  Review of Systems  Vital Signs: BP 111/80 mmHg  Pulse 68  Temp(Src) 98.5 F (36.9 C)  SpO2 98%  Physical Exam  Constitutional: He is oriented to person, place, and time. He appears well-developed and well-nourished.  HENT:  Head: Normocephalic and atraumatic.  Neck: Normal range of motion. Neck supple.  Pulmonary/Chest: Effort normal.  Neurological: He is alert and oriented to person, place, and time.  Skin: Skin is warm and dry.    Mallampati Score:     Imaging: US Renal  01/01/2015   CLINICAL DATA:  Renal artery stenosis, post left stent.  EXAM: RENAL/URINARY TRACT ULTRASOUND  RENAL DUPLEX ULTRASOUND  COMPARISON:  11/14/2013  FINDINGS: Right Kidney:  Length: 6.7 cm.  Diffuse parenchymal atrophy.  No  discrete lesion.  Left Kidney:  Length: 13.3 cm. Echogenicity within normal limits. No mass or hydronephrosis visualized.  Bladder: Incompletely distended, unremarkable. Prostatic enlargement noted.  RENAL DUPLEX ULTRASOUND  Right Renal Artery not adequately visualized for evaluation  Left Renal Artery Velocities:  Origin:  93 cm/sec  Mid:  147 cm/sec  Hilum:  71 cm/sec  Interlobar:  27 cm/sec  Arcuate:  20.7 cm/sec  Aortic Velocity: 64.2 Cm/sec. Maximum transverse dimensions 37 x 33 mm.  Left Renal-Aortic Ratios:  Origin: 1.4  Mid: 2.2  Hilum: 1.1  Interlobar: 0.4  Arcuate: 0.3  IMPRESSION: 1. Patent left renal artery stent. No elevation in peak systolic velocities to suggest hemodynamically significant restenosis. 2. 3.7 cm abdominal aortic aneurysm. Recommend followup by ultrasound in 2 years. This recommendation follows ACR consensus guidelines: White Paper of the ACR Incidental Findings Committee II on Vascular Findings. J Am Coll Radiol 2013; 10:789-794.   Electronically Signed   By: Lucrezia Europe M.D.   On: 01/01/2015  09:36   US Renal Artery Stenosis  01/01/2015   CLINICAL DATA:  Renal artery stenosis, post left stent.  EXAM: RENAL/URINARY TRACT ULTRASOUND  RENAL DUPLEX ULTRASOUND  COMPARISON:  11/14/2013  FINDINGS: Right Kidney:  Length: 6.7 cm.  Diffuse parenchymal atrophy.  No  discrete lesion.  Left Kidney:  Length: 13.3 cm. Echogenicity within normal limits. No mass or hydronephrosis visualized.  Bladder: Incompletely distended, unremarkable. Prostatic enlargement noted.  RENAL DUPLEX ULTRASOUND  Right Renal Artery not adequately visualized for evaluation  Left Renal Artery Velocities:  Origin:  93 cm/sec  Mid:  147 cm/sec  Hilum:  71 cm/sec  Interlobar:  27 cm/sec  Arcuate:  20.7 cm/sec  Aortic Velocity: 64.2 Cm/sec. Maximum transverse dimensions 37 x 33 mm.  Left Renal-Aortic Ratios:  Origin: 1.4  Mid: 2.2  Hilum: 1.1  Interlobar: 0.4  Arcuate: 0.3  IMPRESSION:  1. Patent left renal artery stent. No elevation in peak systolic velocities to suggest hemodynamically significant restenosis. 2. 3.7 cm abdominal aortic aneurysm. Recommend followup by ultrasound in 2 years. This recommendation follows ACR consensus guidelines: White Paper of the ACR Incidental Findings Committee II on Vascular Findings. J Am Coll Radiol 2013; 10:789-794.   Electronically Signed   By: Lucrezia Europe M.D.   On: 01/01/2015 09:36    Labs:  CBC: No results for input(s): WBC, HGB, HCT, PLT in the last 8760 hours.  COAGS: No results for input(s): INR, APTT in the last 8760 hours.  BMP:  Recent Labs  01/22/14 1033 03/11/14 1224 10/10/14 1152  NA 139 138 135  K 4.9 4.6 4.5  CL 108 101 106  CO2 25 22 24   GLUCOSE 99 99 119*  BUN 19 20 13   CALCIUM 9.9 9.6 9.3  CREATININE 1.8* 1.81* 1.89*  GFRNONAA  --  39* 37*  GFRAA  --  45* 43*    LIVER FUNCTION TESTS: No results for input(s): BILITOT, AST, ALT, ALKPHOS, PROT, ALBUMIN in the last 8760 hours.  TUMOR MARKERS: No results for input(s): AFPTM, CEA, CA199, CHROMGRNA in the last  8760 hours.  Assessment and Plan:  Mr. signs renal Doppler study demonstrates patency of his left renal artery stent. 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 He will follow-up with me in 1 year after his renal Doppler examination.  Thank you for this interesting consult.  I greatly enjoyed meeting Honor T Schimpf and look forward to participating in their care.  Signed: Chester Romero, ART A 01/01/2015, 10:10 AM   I spent a total of   15 Minutes in face to face in clinical consultation, greater than 50% of which was counseling/coordinating care for renal artery stenosis and stenting.

## 2015-02-07 ENCOUNTER — Other Ambulatory Visit (HOSPITAL_COMMUNITY): Payer: Self-pay

## 2015-02-07 DIAGNOSIS — I255 Ischemic cardiomyopathy: Secondary | ICD-10-CM

## 2015-02-07 MED ORDER — METOPROLOL SUCCINATE ER 100 MG PO TB24: 100.0000 mg | ORAL_TABLET | Freq: Every day | ORAL | Status: DC

## 2015-02-07 MED ORDER — HYDRALAZINE HCL 50 MG PO TABS: 50.0000 mg | ORAL_TABLET | Freq: Three times a day (TID) | ORAL | Status: DC

## 2015-02-07 MED ORDER — ATORVASTATIN CALCIUM 80 MG PO TABS: 80.0000 mg | ORAL_TABLET | Freq: Every day | ORAL | Status: DC

## 2015-02-07 MED ORDER — ISOSORBIDE MONONITRATE ER 30 MG PO TB24: 60.0000 mg | ORAL_TABLET | Freq: Two times a day (BID) | ORAL | Status: DC

## 2015-02-07 MED ORDER — FENOFIBRATE 160 MG PO TABS: ORAL_TABLET | ORAL | Status: DC

## 2015-02-07 MED ORDER — DIGOXIN 125 MCG PO TABS: 0.0625 mg | ORAL_TABLET | ORAL | Status: DC

## 2015-02-14 ENCOUNTER — Encounter: Payer: Self-pay | Admitting: *Deleted

## 2015-02-25 ENCOUNTER — Telehealth: Payer: Self-pay | Admitting: Internal Medicine

## 2015-02-25 NOTE — Telephone Encounter (Signed)
New Message        Pt calling stating that he received a letter from the device clinic stating that he is past due to have his device checked. Pt states he sees Dr. Haroldine Laws and gets his device checked there. Pt states he would like a call back to clarify and if he needs to make an appt with [place] he will. Please call back and advise.

## 2015-02-25 NOTE — Telephone Encounter (Signed)
Spoke w/ pt and informed him that he would need to follow up w/ MD to have entire device function interrogated. Pt verbalized understanding.

## 2015-02-28 ENCOUNTER — Other Ambulatory Visit (HOSPITAL_COMMUNITY): Payer: Self-pay | Admitting: *Deleted

## 2015-02-28 MED ORDER — EZETIMIBE 10 MG PO TABS: 10.0000 mg | ORAL_TABLET | Freq: Every day | ORAL | Status: DC

## 2015-03-10 ENCOUNTER — Encounter: Payer: Self-pay | Admitting: *Deleted

## 2015-03-18 ENCOUNTER — Ambulatory Visit (INDEPENDENT_AMBULATORY_CARE_PROVIDER_SITE_OTHER): Payer: 59 | Admitting: Internal Medicine

## 2015-03-18 ENCOUNTER — Encounter: Payer: Self-pay | Admitting: Internal Medicine

## 2015-03-18 VITALS — BP 110/80 | HR 76 | Ht 72.0 in | Wt 200.2 lb

## 2015-03-18 DIAGNOSIS — I255 Ischemic cardiomyopathy: Secondary | ICD-10-CM

## 2015-03-18 DIAGNOSIS — I5022 Chronic systolic (congestive) heart failure: Secondary | ICD-10-CM

## 2015-03-18 DIAGNOSIS — Z4502 Encounter for adjustment and management of automatic implantable cardiac defibrillator: Secondary | ICD-10-CM

## 2015-03-18 LAB — CUP PACEART INCLINIC DEVICE CHECK
Battery Voltage: 3.03 V
HighPow Impedance: 209 Ohm
HighPow Impedance: 73 Ohm
Lead Channel Impedance Value: 494 Ohm
Lead Channel Pacing Threshold Amplitude: 0.75 V
Lead Channel Pacing Threshold Pulse Width: 0.4 ms
Lead Channel Sensing Intrinsic Amplitude: 7.75 mV
Lead Channel Sensing Intrinsic Amplitude: 8.625 mV
Lead Channel Setting Pacing Amplitude: 2.5 V
MDC IDC MSMT BATTERY REMAINING LONGEVITY: 130 mo
MDC IDC SESS DTM: 20160802151320
MDC IDC SET LEADCHNL RV PACING PULSEWIDTH: 0.4 ms
MDC IDC SET LEADCHNL RV SENSING SENSITIVITY: 0.3 mV
MDC IDC SET ZONE DETECTION INTERVAL: 300 ms
MDC IDC STAT BRADY RV PERCENT PACED: 0 %
Zone Setting Detection Interval: 340 ms
Zone Setting Detection Interval: 360 ms

## 2015-03-18 MED ORDER — ASPIRIN EC 81 MG PO TBEC: 81.0000 mg | DELAYED_RELEASE_TABLET | Freq: Every day | ORAL | Status: DC

## 2015-03-18 NOTE — Patient Instructions (Addendum)
Medication Instructions:  Your physician has recommended you make the following change in your medication:  1) DECREASE Aspirin to 81 mg daily  Labwork: None ordered  Testing/Procedures: None ordered  Follow-Up: Your physician recommends that you schedule a follow-up appointment in: 3 months with heart failure clinic.  Remote monitoring is used to monitor your Pacemaker of ICD from home. This monitoring reduces the number of office visits required to check your device to one time per year. It allows [place] to keep an eye on the functioning of your device to ensure it is working properly. You are scheduled for a device check from home on 06/17/15. You may send your transmission at any time that day. If you have a wireless device, the transmission will be sent automatically. After your physician reviews your transmission, you will receive a postcard with your next transmission date.  Your physician wants you to follow-up in: 1 year with Dr. Caryl Comes.  You will receive a reminder letter in the mail two months in advance. If you don't receive a letter, please call our office to schedule the follow-up appointment.  Any Other Special Instructions Will Be Listed Below (If Applicable). Thank you for choosing [facility]!!

## 2015-03-18 NOTE — Progress Notes (Signed)
Patient Care Team: Florina Ou, MD as PCP - General (Family Medicine) Florina Ou, MD as Attending Physician (Family Medicine)   HPI  Logan Burton is a 59 y.o. male Seen in followup for an ICD implanted 3/15. At that time he underwent ICD implantation  has significant shortness of breath with exertion. He occasionally has some chest discomfort with it. He has some nocturnal dyspnea, especially on the left side. Interestingly, it is relieved simply by rolling over.  He has not had any peripheral edema or orthopnea;   Has history of ischemic heart disease with prior bypass surgery.6/12/ 2014 Nuclear Stress test - LVEF 26% Inferior and inferolateral akinesis with the remainder LV hypokinetic.  02/07/2009 LHC LAD 70% stenosis in the mid vessel and 50% stenosis at the insertion of the left internal mammary artery. Circumflex occluded.  PFTs 02/02/13 FEV1 2.58 (66%) ratio 70 with FV curve c/w obst  Last creatinine was 1.62/15 Past Medical History  Diagnosis Date  . Coronary artery disease     s/p CABG  . Myocardial infarction   . CHF (congestive heart failure)   . Chronic renal insufficiency   . PVD (peripheral vascular disease)   . Dyslipidemia   . 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 Chronotropic incompetence with sinus node dysfunction ? Iatrogenic 01/22/2014    Past Surgical History  Procedure Laterality Date  . Coronary artery bypass graft    . Cervical fusion    . Hernia repair    . Angioplasty    . Implantable cardioverter defibrillator implant  10/15/13    MDT single chamber ICD implanted by Dr Caryl Comes for primary prevention  . Right heart catheterization N/A 09/20/2013    Procedure: RIGHT HEART CATH;  Surgeon: Jolaine Artist, MD;  Location: [facility] CATH LAB;  Service: Cardiovascular;  Laterality: N/A;  . Implantable cardioverter defibrillator implant N/A 10/15/2013    Procedure: IMPLANTABLE CARDIOVERTER DEFIBRILLATOR IMPLANT;  Surgeon: Deboraha Sprang, MD;  Location: [facility] CATH LAB;  Service: Cardiovascular;  Laterality: N/A;    Current Outpatient Prescriptions  Medication Sig Dispense Refill  . aspirin 325 MG tablet Take 325 mg by mouth daily.    Marland Kitchen atorvastatin (LIPITOR) 80 MG tablet Take 1 tablet (80 mg total) by mouth daily. 90 tablet 3  . buPROPion (WELLBUTRIN SR) 150 MG 12 hr tablet Take 150 mg by mouth daily.    . digoxin (LANOXIN) 0.125 MG tablet Take 0.5 tablets (0.0625 mg total) by mouth every other day. 45 tablet 3  . escitalopram (LEXAPRO) 20 MG tablet Take 20 mg by mouth daily.     Marland Kitchen ezetimibe (ZETIA) 10 MG tablet Take 1 tablet (10 mg total) by mouth daily. 30 tablet 6  . fenofibrate 160 MG tablet TAKE 1 TABLET BY MOUTH EVERY DAY 90 tablet 3  . hydrALAZINE (APRESOLINE) 50 MG tablet Take 1 tablet (50 mg total) by mouth 3 (three) times daily. 270 tablet 3  . isosorbide mononitrate (IMDUR) 30 MG 24 hr tablet Take 2 tablets (60 mg total) by mouth 2 (two) times daily. 360 tablet 3  . metoprolol succinate (TOPROL-XL) 100 MG 24 hr tablet Take 1 tablet (100 mg total) by mouth daily. 90 tablet 3  . Omega-3 Fatty Acids (FISH OIL) 600 MG CAPS Take 600 mg by mouth daily.     . Vitamin D, Ergocalciferol, (DRISDOL) 50000 UNITS CAPS Take 50,000 Units by mouth once a week. Every Wednesday     No current facility-administered medications for  this visit.    No Known Allergies  Review of Systems negative except from HPI and PMH  Physical E isxam BP 110/80 mmHg  Pulse 76  Ht 6' (1.829 m)  Wt 200 lb 3.2 oz (90.81 kg)  BMI 27.15 kg/m2 Well developed and well nourished in no acute distress HENT normal E scleral and icterus clear Neck Supple JVP flat; carotids brisk and full Clear to ausculation pocket well healed; without hematoma or erythema.  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  The information was reviewed. No changes were made in the programming.     Renal insufficiency grade 3  Hyperlipidemia      He is euvolemic. We will continue him on his current medications. Notably, he is not on a diuretic. No symptoms of ischemia. A Myoview scan 7/15 was ordered but never consummated.  Heart failure symptoms persist despite medical therapy; I suspect that he has been intolerant of Ace inhibitors based on his medical regime. His digoxin level was low at his last assessment; we will have him follow heart failure team. In addition, I will have them address whether he should be on mononitrate versus dinitrate.  We will decrease his aspirin dose 325--81  He had said he has started at his last visit. It cost $340 a month. I wonder whether he might not be benefited by a switch from atorvastatin to rosuvastatin

## 2015-04-15 ENCOUNTER — Ambulatory Visit (HOSPITAL_COMMUNITY)
Admission: RE | Admit: 2015-04-15 | Discharge: 2015-04-15 | Disposition: A | Discharge status: Home / Self Care | Payer: 59 | Source: Ambulatory Visit | Attending: Internal Medicine | Admitting: Internal Medicine

## 2015-04-15 VITALS — BP 102/68 | HR 68 | Wt 196.8 lb

## 2015-04-15 DIAGNOSIS — Z951 Presence of aortocoronary bypass graft: Secondary | ICD-10-CM | POA: Diagnosis not present

## 2015-04-15 DIAGNOSIS — Z7982 Long term (current) use of aspirin: Secondary | ICD-10-CM | POA: Insufficient documentation

## 2015-04-15 DIAGNOSIS — I5022 Chronic systolic (congestive) heart failure: Secondary | ICD-10-CM

## 2015-04-15 DIAGNOSIS — Z95810 Presence of automatic (implantable) cardiac defibrillator: Secondary | ICD-10-CM | POA: Insufficient documentation

## 2015-04-15 DIAGNOSIS — Z87891 Personal history of nicotine dependence: Secondary | ICD-10-CM | POA: Insufficient documentation

## 2015-04-15 DIAGNOSIS — E785 Hyperlipidemia, unspecified: Secondary | ICD-10-CM | POA: Diagnosis not present

## 2015-04-15 DIAGNOSIS — Z79899 Other long term (current) drug therapy: Secondary | ICD-10-CM | POA: Insufficient documentation

## 2015-04-15 DIAGNOSIS — I255 Ischemic cardiomyopathy: Secondary | ICD-10-CM | POA: Insufficient documentation

## 2015-04-15 DIAGNOSIS — I2510 Atherosclerotic heart disease of native coronary artery without angina pectoris: Secondary | ICD-10-CM | POA: Diagnosis not present

## 2015-04-15 DIAGNOSIS — I252 Old myocardial infarction: Secondary | ICD-10-CM | POA: Diagnosis not present

## 2015-04-15 DIAGNOSIS — I739 Peripheral vascular disease, unspecified: Secondary | ICD-10-CM | POA: Diagnosis not present

## 2015-04-15 DIAGNOSIS — N189 Chronic kidney disease, unspecified: Secondary | ICD-10-CM | POA: Diagnosis not present

## 2015-04-15 DIAGNOSIS — I2583 Coronary atherosclerosis due to lipid rich plaque: Secondary | ICD-10-CM

## 2015-04-15 DIAGNOSIS — Z5181 Encounter for therapeutic drug level monitoring: Secondary | ICD-10-CM | POA: Diagnosis not present

## 2015-04-15 DIAGNOSIS — J449 Chronic obstructive pulmonary disease, unspecified: Secondary | ICD-10-CM | POA: Diagnosis not present

## 2015-04-15 LAB — BASIC METABOLIC PANEL
Anion gap: 6 (ref 5–15)
BUN: 15 mg/dL (ref 6–20)
CALCIUM: 9.2 mg/dL (ref 8.9–10.3)
CO2: 24 mmol/L (ref 22–32)
CREATININE: 1.85 mg/dL — AB (ref 0.61–1.24)
Chloride: 106 mmol/L (ref 101–111)
GFR calc Af Amer: 44 mL/min — ABNORMAL LOW (ref >60)
GFR, EST NON AFRICAN AMERICAN: 38 mL/min — AB (ref >60)
Glucose, Bld: 97 mg/dL (ref 65–99)
Potassium: 4.4 mmol/L (ref 3.5–5.1)
Sodium: 136 mmol/L (ref 135–145)

## 2015-04-15 LAB — DIGOXIN LEVEL: Digoxin Level: <0.2 ng/mL — ABNORMAL LOW (ref 0.8–2.0)

## 2015-04-15 NOTE — Progress Notes (Signed)
Patient ID: Logan Burton, male   DOB: 1956/04/10, 59 y.o.   MRN: [id number]  HPI: [name] is a 59 year old with a history of ischemic cardiomyopathy S/P CABG 8546, chronic systolic heart failure 2/70 echo EF 30%, CKD, L renal artery stenosis S/P stent 2013, atrophic right kidney , RBBB, PAD, and hyperlipidemia. He has not been on ACEI or spironolactone due CKD. Underwent Medtronic ICD placement on 10/15/13.   01/2009 LHC LAD 70% stenosis in the mid vessel and 50% stenosis at the insertion of the left internal mammary artery. Circumflex occluded.  01/2013  Nuclear Stress test - LVEF 26% Inferior and inferolateral akinesis with the remainder LV hypokinetic.   PFTs 02/02/13 FEV1 2.58 (66%), consistent with mild COPD  CPX 07/05/13  Peak VO2: 15.4 (48.2% predicted peak VO2) VE/VCO2 slope: 41.7 OUES: 1.51 Peak RER: 1.16  RHC 09/17/2013: RA = 3  RV = 26/0/5  PA = 26/8 (16)  PCW = 7  Fick cardiac output/index = 4.5/2.2  Thermo CO/CI = 4.3/2.1  PVR = 1.0 WU  FA sat = 92%  PA sat = 64%, 66%  Echo 11/07/13 30%  Myoview 7/15: Intermediate risk stress nuclear study with large fixed inferolateral scar. No ischemia. Prominent RV uptake suggestive of increased LV filling pressure or pulmonary hypertension.  LV Ejection Fraction: 38%. LV Wall Motion: Inferolateral akinesis to dyskinesis  CPX (2/16): Peak VO2 15.4 VE/VCO2 45.8 VE/MVV61.8% RER 1.07 FEV1 58% FVC 72% Moderate functional impairment, moderate COPD, chronotropic incompetence noted.   He presents today for HF follow up. At his last visit we decreased his Toprol after his CPX showed chronotropic incompetence. He was seen by Dr Caryl Comes on 8/2 and has no problems with his ICD. His weight is stable from his last visit. No dizziness or lightheadedness.  Has no problems with fluid, not on diuretics. Breathing is about the same.  He can get around the house with no problems.  Can make it around the grocery storey without SOB as long as he takes his  time. It is worse with inclines or when he hurries. No palpitations or ICD shocks.   Optivol interrogated today.  Fluid status stable. Thoracic impedence trending up.  1-2 hrs of activity a day. No VT/AF  Labs 6/14 LDL 105 Labs 05/18/13 K 4.4 Creatinine 1.8 Labs 09/07/13 K 4.2 Creatinine 2.0 Dig level 0.3  Labs 10/12/13 K 4.6 Cr 1.6 HCT 47.6 Labs 6/15 K 4.9, creatinine 1.8, digoxin 0.3 Labs 7/15 K 4.6, creatinine 1.8 Labs 9/15 creatinine 1.65 Labs 2/16 K 4.5, creatinine 1.89  FH: No family members with cardiac history  SH: Divorced.  Lives alone.  Quit smoking 2007. Does not drink alcohol. Works full time to sterilize surgical equipment  ROS: All systems reviewed and negative except as per HPI.   Past Medical History  Diagnosis Date  . Coronary artery disease     s/p CABG  . Myocardial infarction   . CHF (congestive heart failure)   . Chronic renal insufficiency   . PVD (peripheral vascular disease)   . Dyslipidemia   . 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 Chronotropic incompetence with sinus node dysfunction ? Iatrogenic 01/22/2014    Current Outpatient Prescriptions  Medication Sig Dispense Refill  . aspirin 325 MG tablet Take 325 mg by mouth daily.    Marland Kitchen atorvastatin (LIPITOR) 80 MG tablet Take 1 tablet (80 mg total) by mouth daily. 90 tablet 3  . buPROPion (WELLBUTRIN SR) 150 MG 12 hr tablet Take  150 mg by mouth daily.    . digoxin (LANOXIN) 0.125 MG tablet Take 0.5 tablets (0.0625 mg total) by mouth every other day. 45 tablet 3  . escitalopram (LEXAPRO) 20 MG tablet Take 20 mg by mouth daily.     Marland Kitchen ezetimibe (ZETIA) 10 MG tablet Take 1 tablet (10 mg total) by mouth daily. 30 tablet 6  . fenofibrate 160 MG tablet TAKE 1 TABLET BY MOUTH EVERY DAY 90 tablet 3  . hydrALAZINE (APRESOLINE) 50 MG tablet Take 1 tablet (50 mg total) by mouth 3 (three) times daily. 270 tablet 3  . isosorbide mononitrate (IMDUR) 30 MG 24 hr tablet Take 2 tablets (60 mg total)  by mouth 2 (two) times daily. 360 tablet 3  . metoprolol succinate (TOPROL-XL) 100 MG 24 hr tablet Take 1 tablet (100 mg total) by mouth daily. 90 tablet 3  . Omega-3 Fatty Acids (FISH OIL) 600 MG CAPS Take 600 mg by mouth daily.     . Vitamin D, Ergocalciferol, (DRISDOL) 50000 UNITS CAPS Take 50,000 Units by mouth once a week. Every Wednesday     No current facility-administered medications for this encounter.   No Known Allergies  Social History   Social History  . Marital Status: Divorced    Spouse Name: N/A  . Number of Children: N/A  . Years of Education: N/A   Occupational History  . [employer]    Social History Main Topics  . Smoking status: Former Smoker -- 2.00 packs/day for 30 years    Types: Cigarettes    Quit date: 02/23/2006  . Smokeless tobacco: Never Used  . Alcohol Use: Yes     Comment: only rare social occasions  . Drug Use: No  . Sexual Activity: Not Currently   Other Topics Concern  . Not on file   Social History Narrative    Family History  Problem Relation Age of Onset  . Kidney failure Father     PHYSICAL EXAM: Filed Vitals:   04/15/15 1404  BP: 102/68  Pulse: 68   General:  Well appearing. NAD HEENT: normal Neck: supple. no JVD. Carotids 2+ bilat; no bruits. No lymphadenopathy or thryomegaly noted. Cor: PMI nondisplaced. Regular rate & rhythm. No rubs, gallops or murmurs. Lungs: CTA Abdomen: soft, nontender, nondistended. No hepatosplenomegaly. No bruits or masses. +BS Extremities: no cyanosis, clubbing, rash, edema Neuro: alert & oriented x 3, cranial nerves grossly intact. moves all 4 extremities w/o difficulty. Affect pleasant.  ASSESSMENT & PLAN:  1. Chronic Systolic Heart Failure: Ischemic cardiomyopathy, 11/07/12 EF 30%.  Overall stable NYHA III symptoms. Reasonable cardiac output on right heart cath in 2/15. CPX 2/16 showed chronotropoic incompetence and moderate functional impairment likely due to a combination of cardiac  and pulmonary disease.  Weight is stable and he does not look volume overloaded.   - Continue aggressive medical therapy.  - He has a Medtronic ICD, not good CRT candidate with RBBB.  - Continue current digoxin, check level today.  - Continue current hydralazine/ Imdur, BP remains too soft to titrate  - He is not on ACEI or spironolactone due to CKD and high normal potassium.    - With chronotropic incompetence noted on CPX, continue toprol XL to 100 mg daily.  2. CKD:  Left renal artery stenosis s/p stent 2013. Atrophic right kidney. Patient has followup with nephrology.  BMET today.  3. CAD s/p CABG: No chest pain.   - Continue zetia and atorvastatin. - OK to take 80  mg ASA.  - Last Cardiolite showed infarction with no ischemia. 4. COPD: Suspect moderate COPD.  Spiriva did not help his breathing.    Labs today.  Follow up in 4 months.  Shirley Friar PA-C 04/15/2015  Patient seen and examined with Oda Kilts, PA-C. We discussed all aspects of the encounter. I agree with the assessment and plan as stated above.   He continues with NYHA Class III symptoms. CPX with markedly elevated Ve/VCO2 slope but he is not good candidate for VAD or transplant due to CKD with single kidney and PAD. Continue aggressive medical therapy.  ICD interrogated personally as above.   Bensimhon, Daniel,MD 12:09 AM  .

## 2015-04-15 NOTE — Patient Instructions (Signed)
Labs today  Your physician recommends that you schedule a follow-up appointment in: 4 months  

## 2015-06-17 ENCOUNTER — Ambulatory Visit (INDEPENDENT_AMBULATORY_CARE_PROVIDER_SITE_OTHER): Payer: 59 | Admitting: *Deleted

## 2015-06-17 DIAGNOSIS — I5022 Chronic systolic (congestive) heart failure: Secondary | ICD-10-CM | POA: Diagnosis not present

## 2015-06-17 DIAGNOSIS — I255 Ischemic cardiomyopathy: Secondary | ICD-10-CM | POA: Diagnosis not present

## 2015-06-18 NOTE — Progress Notes (Signed)
Remote ICD transmission.   

## 2015-07-03 LAB — CUP PACEART REMOTE DEVICE CHECK
Battery Remaining Longevity: 128 mo
Battery Voltage: 3.03 V
Brady Statistic RV Percent Paced: 0.01 %
Date Time Interrogation Session: 20161101062604
HighPow Impedance: 77 Ohm
Implantable Lead Implant Date: 20150302
Lead Channel Impedance Value: 399 Ohm
Lead Channel Impedance Value: 456 Ohm
Lead Channel Pacing Threshold Pulse Width: 0.4 ms
Lead Channel Sensing Intrinsic Amplitude: 8 mV
Lead Channel Sensing Intrinsic Amplitude: 8 mV
Lead Channel Setting Pacing Pulse Width: 0.4 ms
Lead Channel Setting Sensing Sensitivity: 0.3 mV
MDC IDC LEAD LOCATION: 753860
MDC IDC MSMT LEADCHNL RV PACING THRESHOLD AMPLITUDE: 0.625 V
MDC IDC SET LEADCHNL RV PACING AMPLITUDE: 2.5 V

## 2015-07-04 ENCOUNTER — Encounter: Payer: Self-pay | Admitting: Cardiology

## 2015-08-07 ENCOUNTER — Encounter (HOSPITAL_COMMUNITY): Payer: 59 | Admitting: Internal Medicine

## 2015-08-27 ENCOUNTER — Encounter (HOSPITAL_COMMUNITY): Payer: Self-pay | Admitting: Internal Medicine

## 2015-08-27 ENCOUNTER — Ambulatory Visit (HOSPITAL_COMMUNITY)
Admission: RE | Admit: 2015-08-27 | Discharge: 2015-08-27 | Disposition: A | Discharge status: Home / Self Care | Payer: BLUE CROSS/BLUE SHIELD | Source: Ambulatory Visit | Attending: Internal Medicine | Admitting: Internal Medicine

## 2015-08-27 VITALS — BP 102/60 | HR 73 | Wt 192.8 lb

## 2015-08-27 DIAGNOSIS — N189 Chronic kidney disease, unspecified: Secondary | ICD-10-CM | POA: Diagnosis not present

## 2015-08-27 DIAGNOSIS — Z951 Presence of aortocoronary bypass graft: Secondary | ICD-10-CM | POA: Diagnosis not present

## 2015-08-27 DIAGNOSIS — J449 Chronic obstructive pulmonary disease, unspecified: Secondary | ICD-10-CM | POA: Diagnosis not present

## 2015-08-27 DIAGNOSIS — N183 Chronic kidney disease, stage 3 unspecified: Secondary | ICD-10-CM

## 2015-08-27 DIAGNOSIS — I5022 Chronic systolic (congestive) heart failure: Secondary | ICD-10-CM | POA: Diagnosis present

## 2015-08-27 DIAGNOSIS — I255 Ischemic cardiomyopathy: Secondary | ICD-10-CM | POA: Diagnosis not present

## 2015-08-27 DIAGNOSIS — I2510 Atherosclerotic heart disease of native coronary artery without angina pectoris: Secondary | ICD-10-CM | POA: Diagnosis not present

## 2015-08-27 DIAGNOSIS — I2583 Coronary atherosclerosis due to lipid rich plaque: Secondary | ICD-10-CM

## 2015-08-27 MED ORDER — EZETIMIBE 10 MG PO TABS: 10.0000 mg | ORAL_TABLET | Freq: Every day | ORAL | Status: DC

## 2015-08-27 MED ORDER — ISOSORBIDE MONONITRATE ER 30 MG PO TB24: 60.0000 mg | ORAL_TABLET | Freq: Two times a day (BID) | ORAL | Status: DC

## 2015-08-27 MED ORDER — HYDRALAZINE HCL 50 MG PO TABS: 50.0000 mg | ORAL_TABLET | Freq: Three times a day (TID) | ORAL | Status: DC

## 2015-08-27 MED ORDER — ATORVASTATIN CALCIUM 80 MG PO TABS: 80.0000 mg | ORAL_TABLET | Freq: Every day | ORAL | Status: DC

## 2015-08-27 MED ORDER — METOPROLOL SUCCINATE ER 100 MG PO TB24: 100.0000 mg | ORAL_TABLET | Freq: Every day | ORAL | Status: DC

## 2015-08-27 MED ORDER — DIGOXIN 125 MCG PO TABS: 0.0625 mg | ORAL_TABLET | ORAL | Status: DC

## 2015-08-27 MED ORDER — FENOFIBRATE 160 MG PO TABS: ORAL_TABLET | ORAL | Status: DC

## 2015-08-27 NOTE — Progress Notes (Signed)
ADVANCED HF CLINIC NOTE  Patient ID: Logan Burton, male   DOB: 1955-11-11, 60 y.o.   MRN: [id number]  HPI: Logan Burton is a 60 year old with a history of ischemic cardiomyopathy S/P CABG [id number], chronic systolic heart failure [id number] echo EF 30%, CKD, L renal artery stenosis S/P stent 2013, atrophic right kidney , RBBB, PAD, and hyperlipidemia. He has not been on ACEI or spironolactone due CKD. Underwent Medtronic ICD placement on 10/15/13.   01/2009 LHC LAD 70% stenosis in the mid vessel and 50% stenosis at the insertion of the left internal mammary artery. Circumflex occluded.  01/2013  Nuclear Stress test - LVEF 26% Inferior and inferolateral akinesis with the remainder LV hypokinetic.    In 2016 we decreased his Toprol after his CPX showed chronotropic incompetence.  He presents today for HF follow up. He was seen by Dr Caryl Comes on 8/2 and has no problems with his ICD. His weight is stable from his last visit. No dizziness or lightheadedness.  Has no problems with fluid, not on diuretics. Breathing is about the same.  He can get around the house with no problems.  Can make it around the grocery storey without SOB as long as he takes his time. It is worse with inclines or when he hurries. No palpitations or ICD shocks.   Optivol interrogated today.  Fluid status up in early January but now better.   1-2 hrs of activity a day. No VT/AF  Studies:  PFTs 02/02/13 FEV1 2.58 (66%), consistent with mild COPD  RHC 09/17/2013: RA = 3  RV = 26/0/5  PA = 26/8 (16)  PCW = 7  Fick cardiac output/index = 4.5/2.2  Thermo CO/CI = 4.3/2.1  PVR = 1.0 WU  FA sat = 92%  PA sat = 64%, 66%  Echo 11/07/13 30%  Myoview 7/15: Intermediate risk stress nuclear study with large fixed inferolateral scar. No ischemia. Prominent RV uptake suggestive of increased LV filling pressure or pulmonary hypertension.  LV Ejection Fraction: 38%. LV Wall Motion: Inferolateral akinesis to dyskinesis  CPX (2/16): Peak VO2 15.4 VE/VCO2  45.8 VE/MVV61.8% RER 1.07 FEV1 58% FVC 72% Moderate functional impairment, moderate COPD, chronotropic incompetence noted.    Labs 6/14 LDL 105 Labs 05/18/13 K 4.4 Creatinine 1.8 Labs 09/07/13 K 4.2 Creatinine 2.0 Dig level 0.3  Labs 10/12/13 K 4.6 Cr 1.6 HCT 47.6 Labs 6/15 K 4.9, creatinine 1.8, digoxin 0.3 Labs 7/15 K 4.6, creatinine 1.8 Labs 9/15 creatinine 1.65 Labs 2/16 K 4.5, creatinine 1.89 Labs 8/16 K 4.4, creatinine 1.85  FH: No family members with cardiac history  SH: Divorced.  Lives alone.  Quit smoking 2007. Does not drink alcohol. Works full time to sterilize surgical equipment  ROS: All systems reviewed and negative except as per HPI.   Past Medical History  Diagnosis Date  . Coronary artery disease     s/p CABG  . Myocardial infarction ([place])   . CHF (congestive heart failure) ([place])   . Chronic renal insufficiency   . PVD (peripheral vascular disease) ([place])   . Dyslipidemia   . 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 Chronotropic incompetence with sinus node dysfunction ? Iatrogenic 01/22/2014    Current Outpatient Prescriptions  Medication Sig Dispense Refill  . aspirin 81 MG tablet Take 81 mg by mouth daily.    Marland Kitchen atorvastatin (LIPITOR) 80 MG tablet Take 1 tablet (80 mg total) by mouth daily. 90 tablet 3  . buPROPion (WELLBUTRIN SR) 150 MG 12 hr  tablet Take 150 mg by mouth daily.    . digoxin (LANOXIN) 0.125 MG tablet Take 0.5 tablets (0.0625 mg total) by mouth every other day. 45 tablet 3  . escitalopram (LEXAPRO) 20 MG tablet Take 20 mg by mouth daily.     Marland Kitchen ezetimibe (ZETIA) 10 MG tablet Take 1 tablet (10 mg total) by mouth daily. 30 tablet 6  . fenofibrate 160 MG tablet TAKE 1 TABLET BY MOUTH EVERY DAY 90 tablet 3  . hydrALAZINE (APRESOLINE) 50 MG tablet Take 1 tablet (50 mg total) by mouth 3 (three) times daily. 270 tablet 3  . isosorbide mononitrate (IMDUR) 30 MG 24 hr tablet Take 2 tablets (60 mg total) by mouth 2 (two) times  daily. 360 tablet 3  . metoprolol succinate (TOPROL-XL) 100 MG 24 hr tablet Take 1 tablet (100 mg total) by mouth daily. 90 tablet 3  . Omega-3 Fatty Acids (FISH OIL) 600 MG CAPS Take 600 mg by mouth 2 (two) times daily. Reported on 08/27/2015    . Vitamin D, Ergocalciferol, (DRISDOL) 50000 UNITS CAPS Take 50,000 Units by mouth once a week. Every Wednesday     No current facility-administered medications for this encounter.   No Known Allergies  Social History   Social History  . Marital Status: Divorced    Spouse Name: N/A  . Number of Children: N/A  . Years of Education: N/A   Occupational History  . [employer]    Social History Main Topics  . Smoking status: Former Smoker -- 2.00 packs/day for 30 years    Types: Cigarettes    Quit date: 02/23/2006  . Smokeless tobacco: Never Used  . Alcohol Use: Yes     Comment: only rare social occasions  . Drug Use: No  . Sexual Activity: Not Currently   Other Topics Concern  . Not on file   Social History Narrative    Family History  Problem Relation Age of Onset  . Kidney failure Father     PHYSICAL EXAM: Filed Vitals:   08/27/15 1413  BP: 102/60  Pulse: 73   General:  Well appearing. NAD HEENT: normal Neck: supple. no JVD. Carotids 2+ bilat; no bruits. No lymphadenopathy or thryomegaly noted. Cor: PMI nondisplaced. Regular rate & rhythm. No rubs, gallops or murmurs. Lungs: CTA Abdomen: soft, nontender, nondistended. No hepatosplenomegaly. No bruits or masses. +BS Extremities: no cyanosis, clubbing, rash, edema Neuro: alert & oriented x 3, cranial nerves grossly intact. moves all 4 extremities w/o difficulty. Affect pleasant.  ASSESSMENT & PLAN:  1. Chronic Systolic Heart Failure: Ischemic cardiomyopathy, 3/15 EF 30%.  Overall stable NYHA III symptoms. Reasonable cardiac output on right heart cath in 2/15. CPX 2/16 showed chronotropoic incompetence and moderate functional impairment likely due to a combination of  cardiac and pulmonary disease.  Weight is stable and he does not look volume overloaded.   - Continue aggressive medical therapy.  - He has a Medtronic ICD, not good CRT candidate with RBBB.  - Continue current digoxin, last level 8/16 < 0.2 - Continue current hydralazine/ Imdur, BP remains too soft to titrate  - He is not on ACEI or Entresto or spironolactone due to CKD and high normal potassium, solitary kidney and soft BP.  - With chronotropic incompetence noted on CPX, continue toprol XL to 100 mg daily.  - Will schedule repeat echo in July after his Medicare kicks in. 2. CKD:  Left renal artery stenosis s/p stent 2013. Atrophic right kidney. Patient has followup  with nephrology (Dr. Posey Pronto).   3. CAD s/p CABG: No chest pain.   - Continue zetia and atorvastatin and ASA - Last Cardiolite showed infarction with no ischemia. 4. COPD: This moderate. Spiriva did not help his breathing.     He continues with NYHA Class III symptoms. CPX with markedly elevated Ve/VCO2 slope but he is not good candidate for VAD or transplant due to CKD with single kidney and PAD. Continue aggressive medical therapy.  ICD interrogated personally as above.   Bensimhon, Daniel,MD 2:58 PM  .

## 2015-08-27 NOTE — Addendum Note (Signed)
Encounter addended by: Effie Berkshire, RN on: 08/27/2015  4:03 PM<BR>     Documentation filed: [employer], Orders

## 2015-08-29 ENCOUNTER — Other Ambulatory Visit (HOSPITAL_COMMUNITY): Payer: Self-pay | Admitting: *Deleted

## 2015-08-29 DIAGNOSIS — I255 Ischemic cardiomyopathy: Secondary | ICD-10-CM

## 2015-08-29 MED ORDER — ISOSORBIDE MONONITRATE ER 30 MG PO TB24: 60.0000 mg | ORAL_TABLET | Freq: Two times a day (BID) | ORAL | Status: DC

## 2015-09-04 ENCOUNTER — Other Ambulatory Visit (HOSPITAL_COMMUNITY): Payer: Self-pay | Admitting: Pharmacist

## 2015-09-04 MED ORDER — EZETIMIBE 10 MG PO TABS: 10.0000 mg | ORAL_TABLET | Freq: Every day | ORAL | Status: DC

## 2015-09-04 NOTE — Telephone Encounter (Signed)
Sent in an Rx for generic Zetia instead of brand to see if will be any cheaper through his insurance.   Ruta Hinds. Velva Harman, PharmD, BCPS, CPP Clinical Pharmacist Pager: [phone] Phone: [phone] 09/04/2015 12:29 PM

## 2015-09-15 ENCOUNTER — Encounter: Payer: Self-pay | Admitting: Internal Medicine

## 2015-09-16 ENCOUNTER — Ambulatory Visit (INDEPENDENT_AMBULATORY_CARE_PROVIDER_SITE_OTHER): Payer: BLUE CROSS/BLUE SHIELD | Admitting: *Deleted

## 2015-09-16 DIAGNOSIS — I5022 Chronic systolic (congestive) heart failure: Secondary | ICD-10-CM | POA: Diagnosis not present

## 2015-09-16 DIAGNOSIS — I255 Ischemic cardiomyopathy: Secondary | ICD-10-CM | POA: Diagnosis not present

## 2015-09-16 NOTE — Progress Notes (Signed)
Remote ICD transmission.   

## 2015-09-29 ENCOUNTER — Encounter: Payer: Self-pay | Admitting: Cardiology

## 2015-09-29 LAB — CUP PACEART REMOTE DEVICE CHECK
Battery Voltage: 3.03 V
Brady Statistic RV Percent Paced: 0.01 %
Date Time Interrogation Session: 20170131093824
HighPow Impedance: 83 Ohm
Implantable Lead Model: 6935
Lead Channel Impedance Value: 399 Ohm
Lead Channel Impedance Value: 494 Ohm
Lead Channel Pacing Threshold Amplitude: 0.75 V
Lead Channel Sensing Intrinsic Amplitude: 9.75 mV
Lead Channel Sensing Intrinsic Amplitude: 9.75 mV
Lead Channel Setting Pacing Pulse Width: 0.4 ms
MDC IDC LEAD IMPLANT DT: 20150302
MDC IDC LEAD LOCATION: 753860
MDC IDC MSMT BATTERY REMAINING LONGEVITY: 126 mo
MDC IDC MSMT LEADCHNL RV PACING THRESHOLD PULSEWIDTH: 0.4 ms
MDC IDC SET LEADCHNL RV PACING AMPLITUDE: 2.5 V
MDC IDC SET LEADCHNL RV SENSING SENSITIVITY: 0.3 mV

## 2015-12-08 ENCOUNTER — Other Ambulatory Visit (HOSPITAL_COMMUNITY): Payer: Self-pay | Admitting: Interventional Radiology

## 2015-12-08 DIAGNOSIS — I701 Atherosclerosis of renal artery: Secondary | ICD-10-CM

## 2016-02-13 ENCOUNTER — Other Ambulatory Visit (HOSPITAL_COMMUNITY): Payer: Self-pay | Admitting: *Deleted

## 2016-02-13 ENCOUNTER — Telehealth (HOSPITAL_COMMUNITY): Payer: Self-pay | Admitting: Vascular Surgery

## 2016-02-13 MED ORDER — EZETIMIBE 10 MG PO TABS: 10.0000 mg | ORAL_TABLET | Freq: Every day | ORAL | Status: DC

## 2016-02-13 NOTE — Telephone Encounter (Signed)
Refill  Sent [facility] Zetia ASAP, 3 month supply Insurance changed to day

## 2016-02-26 ENCOUNTER — Other Ambulatory Visit (HOSPITAL_COMMUNITY): Payer: Self-pay | Admitting: *Deleted

## 2016-02-26 DIAGNOSIS — I255 Ischemic cardiomyopathy: Secondary | ICD-10-CM

## 2016-02-26 MED ORDER — ISOSORBIDE MONONITRATE ER 30 MG PO TB24: 60.0000 mg | ORAL_TABLET | Freq: Two times a day (BID) | ORAL | Status: DC

## 2016-03-01 ENCOUNTER — Encounter (HOSPITAL_COMMUNITY): Payer: Self-pay

## 2016-03-01 ENCOUNTER — Other Ambulatory Visit (HOSPITAL_COMMUNITY): Payer: Self-pay | Admitting: *Deleted

## 2016-03-01 DIAGNOSIS — I255 Ischemic cardiomyopathy: Secondary | ICD-10-CM

## 2016-03-01 NOTE — Progress Notes (Addendum)
Attending Physician Statement form dated 01/21/2016 completed and faxed by Dr. Haroldine Laws to [employer] at provided fax # [phone] with supporting documentation/reports (25 pages total). Copy of form scanned into patient's electronic medical record. Niece also picked up original copy and made aware that forms and documents were faxed.  Renee Pain

## 2016-03-12 ENCOUNTER — Encounter: Payer: Self-pay | Admitting: Cardiovascular Disease

## 2016-03-12 DIAGNOSIS — F3341 Major depressive disorder, recurrent, in partial remission: Secondary | ICD-10-CM | POA: Diagnosis not present

## 2016-03-12 DIAGNOSIS — Z951 Presence of aortocoronary bypass graft: Secondary | ICD-10-CM | POA: Diagnosis not present

## 2016-03-12 DIAGNOSIS — N2581 Secondary hyperparathyroidism of renal origin: Secondary | ICD-10-CM | POA: Diagnosis not present

## 2016-03-12 DIAGNOSIS — D638 Anemia in other chronic diseases classified elsewhere: Secondary | ICD-10-CM | POA: Diagnosis not present

## 2016-03-12 DIAGNOSIS — I10 Essential (primary) hypertension: Secondary | ICD-10-CM | POA: Diagnosis not present

## 2016-03-12 DIAGNOSIS — N183 Chronic kidney disease, stage 3 (moderate): Secondary | ICD-10-CM | POA: Diagnosis not present

## 2016-03-12 DIAGNOSIS — E785 Hyperlipidemia, unspecified: Secondary | ICD-10-CM | POA: Diagnosis not present

## 2016-03-17 ENCOUNTER — Ambulatory Visit (INDEPENDENT_AMBULATORY_CARE_PROVIDER_SITE_OTHER): Payer: PPO | Admitting: *Deleted

## 2016-03-17 DIAGNOSIS — I255 Ischemic cardiomyopathy: Secondary | ICD-10-CM

## 2016-03-17 NOTE — Progress Notes (Signed)
Remote ICD transmission.   

## 2016-03-23 DIAGNOSIS — I10 Essential (primary) hypertension: Secondary | ICD-10-CM | POA: Diagnosis not present

## 2016-03-23 DIAGNOSIS — D638 Anemia in other chronic diseases classified elsewhere: Secondary | ICD-10-CM | POA: Diagnosis not present

## 2016-03-23 DIAGNOSIS — N183 Chronic kidney disease, stage 3 (moderate): Secondary | ICD-10-CM | POA: Diagnosis not present

## 2016-03-23 DIAGNOSIS — N2581 Secondary hyperparathyroidism of renal origin: Secondary | ICD-10-CM | POA: Diagnosis not present

## 2016-03-23 DIAGNOSIS — Z6827 Body mass index (BMI) 27.0-27.9, adult: Secondary | ICD-10-CM | POA: Diagnosis not present

## 2016-03-24 ENCOUNTER — Encounter: Payer: Self-pay | Admitting: Cardiology

## 2016-03-24 DIAGNOSIS — H40011 Open angle with borderline findings, low risk, right eye: Secondary | ICD-10-CM | POA: Diagnosis not present

## 2016-03-24 DIAGNOSIS — Z961 Presence of intraocular lens: Secondary | ICD-10-CM | POA: Diagnosis not present

## 2016-03-24 DIAGNOSIS — H524 Presbyopia: Secondary | ICD-10-CM | POA: Diagnosis not present

## 2016-03-24 DIAGNOSIS — H40012 Open angle with borderline findings, low risk, left eye: Secondary | ICD-10-CM | POA: Diagnosis not present

## 2016-04-01 LAB — CUP PACEART REMOTE DEVICE CHECK
Battery Remaining Longevity: 122 mo
Brady Statistic RV Percent Paced: 0.01 %
Date Time Interrogation Session: 20170802092826
HIGH POWER IMPEDANCE MEASURED VALUE: 69 Ohm
Implantable Lead Implant Date: 20150302
Implantable Lead Location: 753860
Lead Channel Impedance Value: 380 Ohm
Lead Channel Impedance Value: 494 Ohm
Lead Channel Pacing Threshold Amplitude: 0.5 V
Lead Channel Pacing Threshold Pulse Width: 0.4 ms
Lead Channel Setting Pacing Pulse Width: 0.4 ms
Lead Channel Setting Sensing Sensitivity: 0.3 mV
MDC IDC MSMT BATTERY VOLTAGE: 3.02 V
MDC IDC MSMT LEADCHNL RV SENSING INTR AMPL: 8.875 mV
MDC IDC MSMT LEADCHNL RV SENSING INTR AMPL: 8.875 mV
MDC IDC SET LEADCHNL RV PACING AMPLITUDE: 2.5 V

## 2016-04-14 DIAGNOSIS — R351 Nocturia: Secondary | ICD-10-CM | POA: Diagnosis not present

## 2016-04-14 DIAGNOSIS — N401 Enlarged prostate with lower urinary tract symptoms: Secondary | ICD-10-CM | POA: Diagnosis not present

## 2016-04-14 DIAGNOSIS — N5201 Erectile dysfunction due to arterial insufficiency: Secondary | ICD-10-CM | POA: Diagnosis not present

## 2016-04-18 ENCOUNTER — Other Ambulatory Visit: Payer: Self-pay | Admitting: Cardiology

## 2016-04-18 DIAGNOSIS — I255 Ischemic cardiomyopathy: Secondary | ICD-10-CM

## 2016-05-17 ENCOUNTER — Ambulatory Visit (HOSPITAL_COMMUNITY)
Admission: RE | Admit: 2016-05-17 | Discharge: 2016-05-17 | Disposition: A | Discharge status: Home / Self Care | Payer: PPO | Source: Ambulatory Visit | Attending: Internal Medicine | Admitting: Internal Medicine

## 2016-05-17 VITALS — BP 92/60 | HR 78 | Wt 194.5 lb

## 2016-05-17 DIAGNOSIS — Z95810 Presence of automatic (implantable) cardiac defibrillator: Secondary | ICD-10-CM | POA: Diagnosis not present

## 2016-05-17 DIAGNOSIS — E785 Hyperlipidemia, unspecified: Secondary | ICD-10-CM | POA: Insufficient documentation

## 2016-05-17 DIAGNOSIS — I701 Atherosclerosis of renal artery: Secondary | ICD-10-CM | POA: Insufficient documentation

## 2016-05-17 DIAGNOSIS — J449 Chronic obstructive pulmonary disease, unspecified: Secondary | ICD-10-CM | POA: Diagnosis not present

## 2016-05-17 DIAGNOSIS — I252 Old myocardial infarction: Secondary | ICD-10-CM | POA: Diagnosis not present

## 2016-05-17 DIAGNOSIS — I4510 Unspecified right bundle-branch block: Secondary | ICD-10-CM | POA: Diagnosis not present

## 2016-05-17 DIAGNOSIS — N261 Atrophy of kidney (terminal): Secondary | ICD-10-CM | POA: Diagnosis not present

## 2016-05-17 DIAGNOSIS — I255 Ischemic cardiomyopathy: Secondary | ICD-10-CM | POA: Diagnosis not present

## 2016-05-17 DIAGNOSIS — Z87891 Personal history of nicotine dependence: Secondary | ICD-10-CM | POA: Diagnosis not present

## 2016-05-17 DIAGNOSIS — Z951 Presence of aortocoronary bypass graft: Secondary | ICD-10-CM | POA: Insufficient documentation

## 2016-05-17 DIAGNOSIS — I2510 Atherosclerotic heart disease of native coronary artery without angina pectoris: Secondary | ICD-10-CM | POA: Insufficient documentation

## 2016-05-17 DIAGNOSIS — I5022 Chronic systolic (congestive) heart failure: Secondary | ICD-10-CM | POA: Insufficient documentation

## 2016-05-17 DIAGNOSIS — Z95828 Presence of other vascular implants and grafts: Secondary | ICD-10-CM | POA: Insufficient documentation

## 2016-05-17 DIAGNOSIS — Z7982 Long term (current) use of aspirin: Secondary | ICD-10-CM | POA: Insufficient documentation

## 2016-05-17 DIAGNOSIS — I739 Peripheral vascular disease, unspecified: Secondary | ICD-10-CM | POA: Insufficient documentation

## 2016-05-17 DIAGNOSIS — N189 Chronic kidney disease, unspecified: Secondary | ICD-10-CM | POA: Diagnosis not present

## 2016-05-17 MED ORDER — METOPROLOL SUCCINATE ER 50 MG PO TB24: 50.0000 mg | ORAL_TABLET | Freq: Every day | ORAL | 3 refills | Status: DC

## 2016-05-17 NOTE — Progress Notes (Signed)
ADVANCED HF CLINIC NOTE  Patient ID: BEAUFORT PHILIBERT, male   DOB: 10-24-1955, 60 y.o.   MRN: [id number]  HPI: Mr Vanduyne is a 60 year old with a history of ischemic cardiomyopathy S/P CABG [id number], chronic systolic heart failure (echo 3/15) EF 30%, CKD, L renal artery stenosis S/P stent 2013, atrophic right kidney , RBBB, PAD, and hyperlipidemia. He has not been on ACEI or spironolactone due CKD. Underwent Medtronic ICD placement on 10/15/13.   01/2009 LHC LAD 70% stenosis in the mid vessel and 50% stenosis at the insertion of the left internal mammary artery. Circumflex occluded.  01/2013  Nuclear Stress test - LVEF 26% Inferior and inferolateral akinesis with the remainder LV hypokinetic.    In 2016 we decreased his Toprol after his CPX showed chronotropic incompetence.  He presents today for HF follow up. He is here for f/u. Feels his HF symptoms are getting worse. Can do ADLs but if tries to walk farther (or faster) gets SOB and legs very fatigued. Denies frank claudication. legs just tired. No rest pain. Has no problems with fluid, not on diuretics. . No palpitations or ICD shocks.   Optivol interrogated today.  Fluid status minimally elevated but below threshold  1-2 hrs of activity a day. No VT/AF. Heart variability low  Studies:  PFTs 02/02/13 FEV1 2.58 (66%), consistent with mild COPD  RHC 09/17/2013: RA = 3  RV = 26/0/5  PA = 26/8 (16)  PCW = 7  Fick cardiac output/index = 4.5/2.2  Thermo CO/CI = 4.3/2.1  PVR = 1.0 WU  FA sat = 92%  PA sat = 64%, 66%  Echo 11/07/13 30%  Myoview 7/15: Intermediate risk stress nuclear study with large fixed inferolateral scar. No ischemia. Prominent RV uptake suggestive of increased LV filling pressure or pulmonary hypertension.  LV Ejection Fraction: 38%. LV Wall Motion: Inferolateral akinesis to dyskinesis  CPX (2/16): Peak VO2 15.4 VE/VCO2 45.8 VE/MVV61.8% RER 1.07 FEV1 58% FVC 72% Moderate functional impairment, moderate COPD, chronotropic  incompetence noted.    Labs 6/14 LDL 105 Labs 05/18/13 K 4.4 Creatinine 1.8 Labs 09/07/13 K 4.2 Creatinine 2.0 Dig level 0.3  Labs 10/12/13 K 4.6 Cr 1.6 HCT 47.6 Labs 6/15 K 4.9, creatinine 1.8, digoxin 0.3 Labs 7/15 K 4.6, creatinine 1.8 Labs 9/15 creatinine 1.65 Labs 2/16 K 4.5, creatinine 1.89 Labs 8/16 K 4.4, creatinine 1.85 digoxin < 0.2  FH: No family members with cardiac history  SH: Divorced.  Lives alone.  Quit smoking 2007. Does not drink alcohol. Works full time to sterilize surgical equipment  ROS: All systems reviewed and negative except as per HPI.   Past Medical History:  Diagnosis Date  . CHF (congestive heart failure) ([place])   . Chronic renal insufficiency   . Chronotropic incompetence with sinus node dysfunction ? Iatrogenic 01/22/2014  . Coronary artery disease    s/p CABG  . Dyslipidemia   . 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 Myocardial infarction   . PVD (peripheral vascular disease) ([place])     Current Outpatient Prescriptions  Medication Sig Dispense Refill  . aspirin 81 MG tablet Take 81 mg by mouth daily.    Marland Kitchen atorvastatin (LIPITOR) 80 MG tablet Take 1 tablet (80 mg total) by mouth daily. 90 tablet 3  . buPROPion (WELLBUTRIN SR) 150 MG 12 hr tablet Take 150 mg by mouth daily.    . digoxin (LANOXIN) 0.125 MG tablet TAKE 1/2 TABLET EVERY OTHER DAY 45 tablet 3  . escitalopram (LEXAPRO)  20 MG tablet Take 20 mg by mouth daily.     Marland Kitchen ezetimibe (ZETIA) 10 MG tablet Take 1 tablet (10 mg total) by mouth daily. 90 tablet 3  . fenofibrate 160 MG tablet TAKE 1 TABLET BY MOUTH EVERY DAY 90 tablet 3  . hydrALAZINE (APRESOLINE) 50 MG tablet Take 1 tablet (50 mg total) by mouth 3 (three) times daily. 270 tablet 3  . isosorbide mononitrate (IMDUR) 30 MG 24 hr tablet Take 2 tablets (60 mg total) by mouth 2 (two) times daily. 360 tablet 3  . metoprolol succinate (TOPROL-XL) 100 MG 24 hr tablet Take 1 tablet (100 mg total) by mouth daily. 90 tablet  3  . Omega-3 Fatty Acids (FISH OIL) 1000 MG CAPS Take 1 capsule by mouth 2 (two) times daily.    . Vitamin D, Ergocalciferol, (DRISDOL) 50000 UNITS CAPS Take 50,000 Units by mouth once a week. Every Wednesday     No current facility-administered medications for this encounter.    No Known Allergies  Social History   Social History  . Marital status: Divorced    Spouse name: N/A  . Number of children: N/A  . Years of education: N/A   Occupational History  . [employer]   Social History Main Topics  . Smoking status: Former Smoker    Packs/day: 2.00    Years: 30.00    Types: Cigarettes    Quit date: 02/23/2006  . Smokeless tobacco: Never Used  . Alcohol use Yes     Comment: only rare social occasions  . Drug use: No  . Sexual activity: Not Currently   Other Topics Concern  . Not on file   Social History Narrative  . No narrative on file    Family History  Problem Relation Age of Onset  . Kidney failure Father     PHYSICAL EXAM: Vitals:   05/17/16 1414  BP: 92/60  Pulse: 78   General:  Well appearing. NAD HEENT: normal Neck: supple. no JVD. Carotids 2+ bilat; no bruits. No lymphadenopathy or thryomegaly noted. Cor: PMI nondisplaced. Regular rate & rhythm. No rubs, gallops or murmurs. Lungs: CTA Abdomen: soft, nontender, nondistended. No hepatosplenomegaly. No bruits or masses. +BS Extremities: no cyanosis, clubbing, rash, edema Neuro: alert & oriented x 3, cranial nerves grossly intact. moves all 4 extremities w/o difficulty. Affect pleasant.  ASSESSMENT & PLAN:  1. Chronic Systolic Heart Failure: Ischemic cardiomyopathy, 3/15 EF 30%.  Overall stable NYHA III symptoms. Reasonable cardiac output on right heart cath in 2/15. CPX 2/16 showed chronotropoic incompetence and moderate functional impairment likely due to a combination of cardiac and pulmonary disease.  .   - He has a Medtronic ICD, not good CRT candidate with RBBB.  - Overall  seems worse NYHA III-IIIB. Volume status ok on exam and by Optivol. May also have component of claudication - Will repeat echo and CPX. We discussed the need for possible advanced therapies. Also check LE u/s.  - Decrease Toprol to 50 daily with severe chronotropic incompetence on CPX and ICD interrogation - Continue current digoxin, last level 8/16 < 0.2 - Continue current hydralazine/ Imdur, BP remains too soft to titrate  - He is not on ACEI or Entresto or spironolactone due to CKD and high normal potassium, solitary kidney and soft BP.  2. CKD:  Left renal artery stenosis s/p stent 2013. Atrophic right kidney. Patient has followup with nephrology (Dr. Posey Pronto).   3. CAD s/p CABG: No chest pain.   -  Continue zetia and atorvastatin and ASA - Last Cardiolite showed infarction with no ischemia. 4. COPD: This moderate. Spiriva did not help his breathing.  5. Exertional leg fatigue - Possible claudication -check ABI and LE u/s   ICD interrogated personally as above.   Kalla Watson,MD 3:05 PM  .

## 2016-05-17 NOTE — Patient Instructions (Signed)
Decrease Metoprolol to 50 mg daily  Your physician has requested that you have an echocardiogram. Echocardiography is a painless test that uses sound waves to create images of your heart. It provides your doctor with information about the size and shape of your heart and how well your heart's chambers and valves are working. This procedure takes approximately one hour. There are no restrictions for this procedure.  Your physician has recommended that you have a cardiopulmonary stress test (CPX). CPX testing is a non-invasive measurement of heart and lung function. It replaces a traditional treadmill stress test. This type of test provides a tremendous amount of information that relates not only to your present condition but also for future outcomes. This test combines measurements of you ventilation, respiratory gas exchange in the lungs, electrocardiogram (EKG), blood pressure and physical response before, during, and following an exercise protocol.  Your physician has requested that you have a lower extremity arterial exercise duplex. During this test, exercise and ultrasound are used to evaluate arterial blood flow in the legs. Allow one hour for this exam. There are no restrictions or special instructions.  Your physician recommends that you schedule a follow-up appointment in: 1 month

## 2016-05-21 ENCOUNTER — Other Ambulatory Visit (HOSPITAL_COMMUNITY): Payer: Self-pay | Admitting: Internal Medicine

## 2016-05-21 DIAGNOSIS — I739 Peripheral vascular disease, unspecified: Secondary | ICD-10-CM

## 2016-05-26 ENCOUNTER — Ambulatory Visit (INDEPENDENT_AMBULATORY_CARE_PROVIDER_SITE_OTHER): Payer: PPO | Admitting: Internal Medicine

## 2016-05-26 ENCOUNTER — Encounter: Payer: Self-pay | Admitting: Internal Medicine

## 2016-05-26 ENCOUNTER — Encounter: Payer: PPO | Admitting: Internal Medicine

## 2016-05-26 VITALS — BP 88/60 | HR 94 | Ht 72.0 in | Wt 186.8 lb

## 2016-05-26 DIAGNOSIS — I4589 Other specified conduction disorders: Secondary | ICD-10-CM | POA: Diagnosis not present

## 2016-05-26 DIAGNOSIS — I255 Ischemic cardiomyopathy: Secondary | ICD-10-CM | POA: Diagnosis not present

## 2016-05-26 DIAGNOSIS — I5022 Chronic systolic (congestive) heart failure: Secondary | ICD-10-CM | POA: Diagnosis not present

## 2016-05-26 DIAGNOSIS — I4510 Unspecified right bundle-branch block: Secondary | ICD-10-CM

## 2016-05-26 DIAGNOSIS — Z95810 Presence of automatic (implantable) cardiac defibrillator: Secondary | ICD-10-CM

## 2016-05-26 LAB — CUP PACEART INCLINIC DEVICE CHECK
Battery Remaining Longevity: 120 mo
Battery Voltage: 3.02 V
Brady Statistic RV Percent Paced: 0.01 %
HighPow Impedance: 74 Ohm
Implantable Lead Implant Date: 20150302
Implantable Lead Location: 753860
Implantable Lead Model: 6935
Lead Channel Impedance Value: 494 Ohm
Lead Channel Pacing Threshold Amplitude: 0.75 V
Lead Channel Pacing Threshold Pulse Width: 0.4 ms
Lead Channel Setting Sensing Sensitivity: 0.3 mV
MDC IDC MSMT LEADCHNL RV IMPEDANCE VALUE: 399 Ohm
MDC IDC MSMT LEADCHNL RV SENSING INTR AMPL: 7.5 mV
MDC IDC SESS DTM: 20171011175728
MDC IDC SET LEADCHNL RV PACING AMPLITUDE: 2.5 V
MDC IDC SET LEADCHNL RV PACING PULSEWIDTH: 0.4 ms

## 2016-05-26 MED ORDER — HYDRALAZINE HCL 25 MG PO TABS: 25.0000 mg | ORAL_TABLET | Freq: Three times a day (TID) | ORAL | 3 refills | Status: DC

## 2016-05-26 NOTE — Progress Notes (Signed)
Patient Care Team: Maurice Small, MD as PCP - General (Family Medicine) Florina Ou, MD (Inactive) as Attending Physician (Family Medicine)   HPI  Logan Burton is a 60 y.o. male Seen in followup for an ICD implanted 3/15. At that time he underwent ICD implantation  has significant shortness of breath with exertion. He occasionally has some chest discomfort with it. He has some nocturnal dyspnea, especially on the left side. Interestingly, it is relieved simply by rolling over.  He has not had any peripheral edema or orthopnea;  He has significant weakness and dyspnea   He saw Dr. Reine Just last week. He is considering CPX and possible LVAD   Has history of ischemic heart disease with prior bypass surgery.6/12/ 2014 Nuclear Stress test - LVEF 26% Inferior and inferolateral akinesis with the remainder LV hypokinetic.  02/07/2009 LHC LAD 70% stenosis in the mid vessel and 50% stenosis at the insertion of the left internal mammary artery. Circumflex occluded.  PFTs 02/02/13 FEV1 2.58 (66%) ratio 70 with FV curve c/w obst  Last creatinine was 1.62/15 Past Medical History:  Diagnosis Date  . CHF (congestive heart failure) ([place])   . Chronic renal insufficiency   . Chronotropic incompetence with sinus node dysfunction ? Iatrogenic 01/22/2014  . Coronary artery disease    s/p CABG  . Dyslipidemia   . 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 Myocardial infarction   . PVD (peripheral vascular disease) ([place])     Past Surgical History:  Procedure Laterality Date  . ANGIOPLASTY    . CERVICAL FUSION    . CORONARY ARTERY BYPASS GRAFT    . HERNIA REPAIR    . IMPLANTABLE CARDIOVERTER DEFIBRILLATOR IMPLANT  10/15/13   MDT single chamber ICD implanted by Dr Caryl Comes for primary prevention  . IMPLANTABLE CARDIOVERTER DEFIBRILLATOR IMPLANT N/A 10/15/2013   Procedure: IMPLANTABLE CARDIOVERTER DEFIBRILLATOR IMPLANT;  Surgeon: Deboraha Sprang, MD;  Location: [facility] CATH LAB;  Service:  Cardiovascular;  Laterality: N/A;  . RIGHT HEART CATHETERIZATION N/A 09/20/2013   Procedure: RIGHT HEART CATH;  Surgeon: Jolaine Artist, MD;  Location: [facility] CATH LAB;  Service: Cardiovascular;  Laterality: N/A;    Current Outpatient Prescriptions  Medication Sig Dispense Refill  . aspirin 81 MG tablet Take 81 mg by mouth daily.    Marland Kitchen atorvastatin (LIPITOR) 80 MG tablet Take 1 tablet (80 mg total) by mouth daily. 90 tablet 3  . buPROPion (WELLBUTRIN SR) 150 MG 12 hr tablet Take 150 mg by mouth daily.    . digoxin (LANOXIN) 0.125 MG tablet Take 0.5 tablet by mouth daily    . escitalopram (LEXAPRO) 20 MG tablet Take 20 mg by mouth daily.     Marland Kitchen ezetimibe (ZETIA) 10 MG tablet Take 1 tablet (10 mg total) by mouth daily. 90 tablet 3  . fenofibrate 160 MG tablet TAKE 1 TABLET BY MOUTH EVERY DAY 90 tablet 3  . hydrALAZINE (APRESOLINE) 50 MG tablet Take 1 tablet (50 mg total) by mouth 3 (three) times daily. 270 tablet 3  . isosorbide mononitrate (IMDUR) 30 MG 24 hr tablet Take 2 tablets (60 mg total) by mouth 2 (two) times daily. 360 tablet 3  . metoprolol succinate (TOPROL-XL) 50 MG 24 hr tablet Take 1 tablet (50 mg total) by mouth daily. 90 tablet 3  . Omega-3 Fatty Acids (FISH OIL) 1000 MG CAPS Take 1 capsule by mouth 2 (two) times daily.    . Vitamin D, Ergocalciferol, (DRISDOL) 50000 UNITS CAPS Take 50,000  Units by mouth once a week. Every Wednesday     No current facility-administered medications for this visit.     No Known Allergies  Review of Systems negative except from HPI and PMH  Physical E isxam BP (!) 88/60   Pulse 94   Ht 6' (1.829 m)   Wt 186 lb 12.8 oz (84.7 kg)   SpO2 96%   BMI 25.33 kg/m  Well developed and well nourished in no acute distress HENT normal E scleral and icterus clear Neck Supple JVP flat; carotids brisk and full Clear to ausculation pocket well healed; without hematoma or erythema.  There is no tethering Regular rate and rhythm, no murmurs gallops or  rub Soft with active bowel sounds No clubbing cyanosis  Edema Alert and oriented, grossly normal motor and sensory function Skin Warm and Dry  ECG: Sinus Rhythm  @87  14/12/38 right bundle branch block left posterior fascicular block Prior IMI with posterior extension. 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  The information was reviewed. No changes were made in the programming.     Renal insufficiency grade 3  Hyperlipidemia      He is euvolemic. We will continue him on his current medications. Notably, he is not on a diuretic.  I spoke Dr. Reine Just  and we will further decrease his hydralazine from 50--25 3 times a day  given his hypotension and weakness. I also wonder whether he might not benefit from Ivabradine given his tachycardia with his low blood pressure  Dr Arletha Pili Kidney  Stable at 1.8

## 2016-05-26 NOTE — Patient Instructions (Signed)
Medication Instructions: Your physician has recommended you make the following change in your medication:   1. DECREASE Hydrlazine to 25 mg by mouth three times daily - New RX sent   Labwork: None Ordered  Procedures/Testing: None Ordered  Follow-Up: Remote monitoring is used to monitor your ICD from home. This monitoring reduces the number of office visits required to check your device to one time per year. It allows [place] to keep an eye on the functioning of your device to ensure it is working properly. You are scheduled for a device check from home on 08/26/15. You may send your transmission at any time that day. If you have a wireless device, the transmission will be sent automatically. After your physician reviews your transmission, you will receive a postcard with your next transmission date.   Your physician wants you to follow-up in 1 YEAR with Dr. Caryl Comes. You will receive a reminder letter in the mail two months in advance. If you don't receive a letter, please call our office to schedule the follow-up appointment.   Any Additional Special Instructions Will Be Listed Below (If Applicable).     If you need a refill on your cardiac medications before your next appointment, please call your pharmacy.

## 2016-05-27 ENCOUNTER — Ambulatory Visit (HOSPITAL_COMMUNITY)
Admission: RE | Admit: 2016-05-27 | Discharge: 2016-05-27 | Disposition: A | Discharge status: Home / Self Care | Payer: PPO | Source: Ambulatory Visit | Attending: Cardiovascular Disease | Admitting: Cardiovascular Disease

## 2016-05-27 DIAGNOSIS — I743 Embolism and thrombosis of arteries of the lower extremities: Secondary | ICD-10-CM | POA: Insufficient documentation

## 2016-05-27 DIAGNOSIS — I739 Peripheral vascular disease, unspecified: Secondary | ICD-10-CM | POA: Insufficient documentation

## 2016-05-27 DIAGNOSIS — R931 Abnormal findings on diagnostic imaging of heart and coronary circulation: Secondary | ICD-10-CM | POA: Diagnosis not present

## 2016-05-28 ENCOUNTER — Encounter: Payer: Self-pay | Admitting: Internal Medicine

## 2016-05-31 ENCOUNTER — Telehealth (HOSPITAL_COMMUNITY): Payer: Self-pay | Admitting: *Deleted

## 2016-05-31 DIAGNOSIS — I739 Peripheral vascular disease, unspecified: Secondary | ICD-10-CM

## 2016-05-31 NOTE — Telephone Encounter (Signed)
referral PV with [place]

## 2016-06-01 ENCOUNTER — Ambulatory Visit (HOSPITAL_COMMUNITY)
Admission: RE | Admit: 2016-06-01 | Discharge: 2016-06-01 | Disposition: A | Discharge status: Home / Self Care | Payer: PPO | Source: Ambulatory Visit | Attending: Internal Medicine | Admitting: Internal Medicine

## 2016-06-01 ENCOUNTER — Telehealth (HOSPITAL_COMMUNITY): Payer: Self-pay | Admitting: Vascular Surgery

## 2016-06-01 ENCOUNTER — Encounter (HOSPITAL_COMMUNITY): Payer: PPO

## 2016-06-01 DIAGNOSIS — I501 Left ventricular failure: Secondary | ICD-10-CM | POA: Diagnosis not present

## 2016-06-01 DIAGNOSIS — I517 Cardiomegaly: Secondary | ICD-10-CM | POA: Diagnosis not present

## 2016-06-01 DIAGNOSIS — I5022 Chronic systolic (congestive) heart failure: Secondary | ICD-10-CM | POA: Insufficient documentation

## 2016-06-01 LAB — ECHOCARDIOGRAM COMPLETE
CHL CUP DOP CALC LVOT VTI: 17.8 cm
CHL CUP MV DEC (S): 208
CHL CUP STROKE VOLUME: 34 mL
E decel time: 208 msec
EERAT: 7.95
FS: 19 % — AB (ref 28–44)
IV/PV OW: 1.47
LA ID, A-P, ES: 30 mm
LA diam index: 1.45 cm/m2
LA vol A4C: 29.3 ml
LAVOL: 40.7 mL
LAVOLIN: 19.7 mL/m2
LDCA: 2.84 cm2
LEFT ATRIUM END SYS DIAM: 30 mm
LV E/e'average: 7.95
LV dias vol: 76 mL (ref 62–150)
LV sys vol index: 20 mL/m2
LVDIAVOLIN: 37 mL/m2
LVEEMED: 7.95
LVELAT: 6.53 cm/s
LVOT diameter: 19 mm
LVOTPV: 85.7 cm/s
LVOTSV: 51 mL
LVSYSVOL: 42 mL (ref 21–61)
MV pk A vel: 68.9 m/s
MVPKEVEL: 51.9 m/s
PW: 9.53 mm — AB (ref 0.6–1.1)
RV LATERAL S' VELOCITY: 9.25 cm/s
Reg peak vel: 217 cm/s
Simpson's disk: 45
TAPSE: 14.3 mm
TDI e' lateral: 6.53
TDI e' medial: 5.98
TR max vel: 217 cm/s

## 2016-06-01 NOTE — Telephone Encounter (Signed)
Left pt message to resch cpx, machine issues

## 2016-06-01 NOTE — Progress Notes (Signed)
  Echocardiogram 2D Echocardiogram has been performed.  Jennette Dubin 06/01/2016, 2:41 PM

## 2016-06-02 ENCOUNTER — Telehealth: Payer: Self-pay | Admitting: Cardiovascular Disease

## 2016-06-02 NOTE — Telephone Encounter (Signed)
Closed encounter °

## 2016-06-03 ENCOUNTER — Telehealth: Payer: Self-pay | Admitting: Cardiovascular Disease

## 2016-06-09 ENCOUNTER — Encounter (HOSPITAL_COMMUNITY): Payer: Self-pay | Admitting: *Deleted

## 2016-06-09 ENCOUNTER — Ambulatory Visit (HOSPITAL_COMMUNITY): Payer: PPO | Attending: Internal Medicine

## 2016-06-09 DIAGNOSIS — I5022 Chronic systolic (congestive) heart failure: Secondary | ICD-10-CM

## 2016-06-09 DIAGNOSIS — I509 Heart failure, unspecified: Secondary | ICD-10-CM | POA: Diagnosis not present

## 2016-06-10 DIAGNOSIS — I5022 Chronic systolic (congestive) heart failure: Secondary | ICD-10-CM | POA: Diagnosis not present

## 2016-06-16 NOTE — Telephone Encounter (Signed)
Closed encounter °

## 2016-06-17 ENCOUNTER — Ambulatory Visit (HOSPITAL_COMMUNITY)
Admission: RE | Admit: 2016-06-17 | Discharge: 2016-06-17 | Disposition: A | Discharge status: Home / Self Care | Payer: PPO | Source: Ambulatory Visit | Attending: Internal Medicine | Admitting: Internal Medicine

## 2016-06-17 ENCOUNTER — Encounter (HOSPITAL_COMMUNITY): Payer: Self-pay | Admitting: Internal Medicine

## 2016-06-17 VITALS — BP 102/60 | HR 87 | Wt 186.5 lb

## 2016-06-17 DIAGNOSIS — I2510 Atherosclerotic heart disease of native coronary artery without angina pectoris: Secondary | ICD-10-CM

## 2016-06-17 DIAGNOSIS — I739 Peripheral vascular disease, unspecified: Secondary | ICD-10-CM | POA: Diagnosis not present

## 2016-06-17 DIAGNOSIS — I2583 Coronary atherosclerosis due to lipid rich plaque: Secondary | ICD-10-CM | POA: Diagnosis not present

## 2016-06-17 DIAGNOSIS — I5022 Chronic systolic (congestive) heart failure: Secondary | ICD-10-CM | POA: Insufficient documentation

## 2016-06-17 DIAGNOSIS — J449 Chronic obstructive pulmonary disease, unspecified: Secondary | ICD-10-CM | POA: Diagnosis not present

## 2016-06-17 DIAGNOSIS — N183 Chronic kidney disease, stage 3 unspecified: Secondary | ICD-10-CM

## 2016-06-17 DIAGNOSIS — I495 Sick sinus syndrome: Secondary | ICD-10-CM | POA: Diagnosis not present

## 2016-06-17 DIAGNOSIS — E0590 Thyrotoxicosis, unspecified without thyrotoxic crisis or storm: Secondary | ICD-10-CM

## 2016-06-17 DIAGNOSIS — E785 Hyperlipidemia, unspecified: Secondary | ICD-10-CM | POA: Diagnosis not present

## 2016-06-17 LAB — BASIC METABOLIC PANEL
Anion gap: 6 (ref 5–15)
BUN: 12 mg/dL (ref 6–20)
CALCIUM: 9.6 mg/dL (ref 8.9–10.3)
CHLORIDE: 108 mmol/L (ref 101–111)
CO2: 25 mmol/L (ref 22–32)
CREATININE: 1.95 mg/dL — AB (ref 0.61–1.24)
GFR calc Af Amer: 41 mL/min — ABNORMAL LOW (ref >60)
GFR calc non Af Amer: 35 mL/min — ABNORMAL LOW (ref >60)
GLUCOSE: 104 mg/dL — AB (ref 65–99)
Potassium: 4.5 mmol/L (ref 3.5–5.1)
Sodium: 139 mmol/L (ref 135–145)

## 2016-06-17 LAB — DIGOXIN LEVEL: Digoxin Level: <0.2 ng/mL — ABNORMAL LOW (ref 0.8–2.0)

## 2016-06-17 LAB — CBC
HEMATOCRIT: 44.3 % (ref 39.0–52.0)
Hemoglobin: 14.6 g/dL (ref 13.0–17.0)
MCH: 27.2 pg (ref 26.0–34.0)
MCHC: 33 g/dL (ref 30.0–36.0)
MCV: 82.5 fL (ref 78.0–100.0)
PLATELETS: 230 10*3/uL (ref 150–400)
RBC: 5.37 MIL/uL (ref 4.22–5.81)
RDW: 15.3 % (ref 11.5–15.5)
WBC: 10.2 10*3/uL (ref 4.0–10.5)

## 2016-06-17 LAB — BRAIN NATRIURETIC PEPTIDE: B Natriuretic Peptide: 92.3 pg/mL (ref 0.0–100.0)

## 2016-06-17 NOTE — Patient Instructions (Addendum)
Labs today    Follow up in 4-6 months

## 2016-06-17 NOTE — Progress Notes (Signed)
ADVANCED HF CLINIC NOTE  Patient ID: Logan Burton, male   DOB: 08-Oct-1955, 60 y.o.   MRN: [id number]  HPI: Logan Burton is a 60 year old with a history of ischemic cardiomyopathy S/P CABG [id number], chronic systolic heart failure (echo 3/15) EF 30%, CKD, L renal artery stenosis S/P stent 2013, atrophic right kidney , RBBB, PAD, and hyperlipidemia. He has not been on ACEI or spironolactone due CKD. Underwent Medtronic ICD placement on 10/15/13.   01/2009 LHC LAD 70% stenosis in the mid vessel and 50% stenosis at the insertion of the left internal mammary artery. Circumflex occluded.  01/2013  Nuclear Stress test - LVEF 26% Inferior and inferolateral akinesis with the remainder LV hypokinetic.    In 2016 we decreased his Toprol after his CPX showed chronotropic incompetence.  He presents today for HF follow up. Has had Echo and CPX as below.  States his symptoms have been stable, but still feels worst over all. Still very tired with fatigued.  Can walk about 1/4 mile before having to stop for SOB or leg fatigue. Still no claudication or rest pain, just leg fatigue. No palpitations or ICD shocks. He is not on diuretics.   Optivol interrogated today: Fluid status slightly elevated, but below threshold. 1-2 hrs of activity a day. No VT/VF. No AF alert.   Echo 06/01/16 EF 35-40%, Grade 1 DD, mild LVH  CPX (10/17): Peak VO2 19.2 VE/VCO2 50 VE/MVV72% RER 1.00 FEV1 59% FVC 72% Moderate functional limitation primarily due to HF. Markedly elevated VE/VCO2 slope concerning for more severe limitation.  Pt also has ventilatory limitation and RLE claudication.   VAS [place] 05/27/16 Occlusion of the right superficial femoral artery. 30-49% stenosis in the left superficial femoral artery.  ABI/TBIs 05/27/16 Left TBI 0.45 ABI 0.71  Right TBI 04.3 ABI 0.66   Studies:  PFTs 02/02/13 FEV1 2.58 (66%), consistent with mild COPD  RHC 09/17/2013: RA = 3  RV = 26/0/5  PA = 26/8 (16)  PCW = 7  Fick cardiac output/index =  4.5/2.2  Thermo CO/CI = 4.3/2.1  PVR = 1.0 WU  FA sat = 92%  PA sat = 64%, 66%  Echo 11/07/13 30%  Myoview 7/15: Intermediate risk stress nuclear study with large fixed inferolateral scar. No ischemia. Prominent RV uptake suggestive of increased LV filling pressure or pulmonary hypertension.  LV Ejection Fraction: 38%. LV Wall Motion: Inferolateral akinesis to dyskinesis  CPX (2/16): Peak VO2 15.4 VE/VCO2 45.8 VE/MVV61.8% RER 1.07 FEV1 58% FVC 72% Moderate functional impairment, moderate COPD, chronotropic incompetence noted.    Labs 6/14 LDL 105 Labs 05/18/13 K 4.4 Creatinine 1.8 Labs 09/07/13 K 4.2 Creatinine 2.0 Dig level 0.3  Labs 10/12/13 K 4.6 Cr 1.6 HCT 47.6 Labs 6/15 K 4.9, creatinine 1.8, digoxin 0.3 Labs 7/15 K 4.6, creatinine 1.8 Labs 9/15 creatinine 1.65 Labs 2/16 K 4.5, creatinine 1.89 Labs 8/16 K 4.4, creatinine 1.85 digoxin < 0.2  FH: No family members with cardiac history  SH: Divorced.  Lives alone.  Quit smoking 2007. Does not drink alcohol. Works full time to sterilize surgical equipment  ROS: All systems reviewed and negative except as per HPI.   Past Medical History:  Diagnosis Date  . CHF (congestive heart failure) ([place])   . Chronic renal insufficiency   . Chronotropic incompetence with sinus node dysfunction ? Iatrogenic 01/22/2014  . Coronary artery disease    s/p CABG  . Dyslipidemia   . 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 Myocardial infarction   .  PVD (peripheral vascular disease) ([place])     Current Outpatient Prescriptions  Medication Sig Dispense Refill  . aspirin 81 MG tablet Take 81 mg by mouth daily.    Marland Kitchen atorvastatin (LIPITOR) 80 MG tablet Take 1 tablet (80 mg total) by mouth daily. 90 tablet 3  . buPROPion (WELLBUTRIN SR) 150 MG 12 hr tablet Take 150 mg by mouth daily.    . digoxin (LANOXIN) 0.125 MG tablet Take 0.0625 mg by mouth every other day.    . escitalopram (LEXAPRO) 20 MG tablet Take 20 mg by mouth  daily.     Marland Kitchen ezetimibe (ZETIA) 10 MG tablet Take 1 tablet (10 mg total) by mouth daily. 90 tablet 3  . fenofibrate 160 MG tablet TAKE 1 TABLET BY MOUTH EVERY DAY 90 tablet 3  . hydrALAZINE (APRESOLINE) 25 MG tablet Take 1 tablet (25 mg total) by mouth 3 (three) times daily. 90 tablet 3  . isosorbide mononitrate (IMDUR) 30 MG 24 hr tablet Take 2 tablets (60 mg total) by mouth 2 (two) times daily. 360 tablet 3  . metoprolol succinate (TOPROL-XL) 50 MG 24 hr tablet Take 1 tablet (50 mg total) by mouth daily. 90 tablet 3  . Omega-3 Fatty Acids (FISH OIL) 1000 MG CAPS Take 1 capsule by mouth 2 (two) times daily.    . Vitamin D, Ergocalciferol, (DRISDOL) 50000 UNITS CAPS Take 50,000 Units by mouth once a week. Every Wednesday     No current facility-administered medications for this encounter.    No Known Allergies  Social History   Social History  . Marital status: Divorced    Spouse name: N/A  . Number of children: N/A  . Years of education: N/A   Occupational History  . [employer]   Social History Main Topics  . Smoking status: Former Smoker    Packs/day: 2.00    Years: 30.00    Types: Cigarettes    Quit date: 02/23/2006  . Smokeless tobacco: Never Used  . Alcohol use Yes     Comment: only rare social occasions  . Drug use: No  . Sexual activity: Not Currently   Other Topics Concern  . Not on file   Social History Narrative  . No narrative on file    Family History  Problem Relation Age of Onset  . Kidney failure Father     PHYSICAL EXAM:  Vitals:   06/17/16 1440  BP: 102/60  Pulse: 87  SpO2: 99%  Weight: 186 lb 8 oz (84.6 kg)   Wt Readings from Last 3 Encounters:  06/17/16 186 lb 8 oz (84.6 kg)  05/26/16 186 lb 12.8 oz (84.7 kg)  05/17/16 194 lb 8 oz (88.2 kg)    General:  Well appearing. NAD HEENT: normal Neck: supple. no JVD. Carotids 2+ bilat; no bruits. No thyromegaly or nodule noted.  Cor: PMI nondisplaced. RRR. No  M/G/R Lungs: Clear, normal effort Abdomen: soft, NT, mildly distended, no HSM. No bruits or masses. +BS  Extremities: no cyanosis, clubbing, rash. No peripheral edema.  Neuro: alert & oriented x 3, cranial nerves grossly intact. moves all 4 extremities w/o difficulty. Affect pleasant.  ASSESSMENT & PLAN:  1. Chronic Systolic Heart Failure: Ischemic cardiomyopathy, 3/15 EF 30%.  Overall stable NYHA III symptoms. Reasonable cardiac output on right heart cath in 2/15. CPX 2/16 showed chronotropoic incompetence and moderate functional impairment likely due to a combination of cardiac and pulmonary disease.  - CPX 10/17 again with moderate functional impairment  but markedly elevated VE/VCO2 slope concerning for severe impairment. Currently with improved EF does not meet criteria for Advanced therapies, but may need in the future.  - He has a Medtronic ICD, not good CRT candidate with RBBB.  - NYHA III-IIIB. Volume status stable on exam and Optivol.  - Echo 06/01/16 EF 35-40%, Grade 1 DD, mild LVH - Continue Toprol 50 daily with severe chronotropic incompetence on CPX and ICD interrogation - Continue digoxin 0.0625. Last level 03/2015 was < 0.2. Recheck today.  - Continue current hydralazine/Imdur, BP remains too soft to titrate  - No room for ACE/ARB/ARNI or Arlyce Harman due to soft pressure, high normal K, and solitary kidney.  2. CKD III:  Left renal artery stenosis s/p stent 2013. Atrophic right kidney.  - Pt sees Dr. Posey Pronto in nephrology 3. CAD s/p CABG: No chest pain.   - Continue ASA 81 mg daily, Atorvastatin 80 mg daily, and Zetia 10 mg daily.  - Last Cardiolite showed infarction with no ischemia. 4. COPD:  - Moderate. Relatively unchanged by CPX PFTs.  - Spiriva did not improve his dyspnea.   5. PAD - VAS [place] 05/27/16 Occlusion of the right superficial femoral artery. 30-49% stenosis in the left superficial femoral artery.  - ABI/TBIs 05/27/16 Left TBI 0.45 ABI 0.71  Right TBI 04.3 ABI 0.66 - To  see Dr. Fletcher Anon.    Will check BMET, BNP, CBC, and Dig level today. Follow up 3-4 months with MD.   Satira Mccallum Tillery,MD 3:16 PM   Patient seen and examined with Logan Kilts, PA-C. We discussed all aspects of the encounter. I agree with the assessment and plan as stated above.   Overall stable from a HF perspective. CPX reviewed in detail he has a moderate to severe HF limitation but pVO2 significantly improved from previous. Will continue current therapy. ICD interrogated in clinic. No VT/AF. Volume status ok. CKD will likely prohibit advanced HF therapies though wiould not completely rule out VAD at this point. Seeing Dr. Fletcher Anon for PAD.   Burton, Daniel,MD 12:15 PM    .

## 2016-06-21 NOTE — Addendum Note (Signed)
Encounter addended by: Jolaine Artist, MD on: 06/21/2016 12:16 PM<BR>    Actions taken: Charge Capture section accepted

## 2016-06-22 ENCOUNTER — Ambulatory Visit (INDEPENDENT_AMBULATORY_CARE_PROVIDER_SITE_OTHER): Payer: PPO | Admitting: Cardiovascular Disease

## 2016-06-22 ENCOUNTER — Encounter: Payer: Self-pay | Admitting: Cardiovascular Disease

## 2016-06-22 VITALS — BP 116/64 | HR 70 | Ht 72.0 in | Wt 186.6 lb

## 2016-06-22 DIAGNOSIS — I2510 Atherosclerotic heart disease of native coronary artery without angina pectoris: Secondary | ICD-10-CM

## 2016-06-22 DIAGNOSIS — E782 Mixed hyperlipidemia: Secondary | ICD-10-CM | POA: Diagnosis not present

## 2016-06-22 DIAGNOSIS — I739 Peripheral vascular disease, unspecified: Secondary | ICD-10-CM | POA: Diagnosis not present

## 2016-06-22 DIAGNOSIS — I5022 Chronic systolic (congestive) heart failure: Secondary | ICD-10-CM

## 2016-06-22 NOTE — Patient Instructions (Signed)
Medication Instructions:  Your physician recommends that you continue on your current medications as directed. Please refer to the Current Medication list given to you today.  Follow-Up: Your physician wants you to follow-up in: 1 year with Dr. Arida.  You will receive a reminder letter in the mail two months in advance. If you don't receive a letter, please call our office to schedule the follow-up appointment.    If you need a refill on your cardiac medications before your next appointment, please call your pharmacy.   

## 2016-06-22 NOTE — Progress Notes (Signed)
Cardiology Office Note   Date:  06/22/2016   ID:  Logan Burton, DOB [birth year], MRN [id number]  PCP:  Jonathon Bellows, MD  Cardiologist:  Dr. Haroldine Laws  Chief Complaint  Patient presents with  . PVD      History of Present Illness: Logan Burton is a 60 y.o. male who was referred by Dr. Haroldine Laws for evaluation and management of PAD. He has known history of ischemic cardiomyopathy status post CABG in [id number], chronic systolic heart failure with ejection fraction of 30%, chronic kidney disease with previous left renal artery stenting in 2013 with atrophied right kidney, peripheral arterial disease and hyperlipidemia. Most recent creatinine was 1.95. He has known history of peripheral arterial disease going back to 2008. He was seen at that time at [facility]. I found an ABI done at that time which was 0.45 on the right and 0.66 on the left. The patient quit smoking in 2007 and he is not diabetic. He complains of bilateral leg pain and numbness worse on the right side. It's mostly described as fatigue in his legs. This happens after walking about one fourth of a mile but usually he does not have to stop. He slows down and he is able to continue. He is also limited by significant shortness of breath. He has no rest pain and no lower extremity ulceration. He underwent recent noninvasive vascular evaluation which showed an ABI of 0.66 on the right and 0.7 on the left. Duplex showed occluded right SFA.    Past Medical History:  Diagnosis Date  . CHF (congestive heart failure) ([place])   . Chronic renal insufficiency   . Chronotropic incompetence with sinus node dysfunction ? Iatrogenic 01/22/2014  . Coronary artery disease    s/p CABG  . Dyslipidemia   . 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 Myocardial infarction   . PVD (peripheral vascular disease) ([place])     Past Surgical History:  Procedure Laterality Date  . ANGIOPLASTY    . CERVICAL FUSION    . CORONARY ARTERY BYPASS  GRAFT    . HERNIA REPAIR    . IMPLANTABLE CARDIOVERTER DEFIBRILLATOR IMPLANT  10/15/13   MDT single chamber ICD implanted by Dr Caryl Comes for primary prevention  . IMPLANTABLE CARDIOVERTER DEFIBRILLATOR IMPLANT N/A 10/15/2013   Procedure: IMPLANTABLE CARDIOVERTER DEFIBRILLATOR IMPLANT;  Surgeon: Deboraha Sprang, MD;  Location: [facility] CATH LAB;  Service: Cardiovascular;  Laterality: N/A;  . RIGHT HEART CATHETERIZATION N/A 09/20/2013   Procedure: RIGHT HEART CATH;  Surgeon: Jolaine Artist, MD;  Location: [facility] CATH LAB;  Service: Cardiovascular;  Laterality: N/A;     Current Outpatient Prescriptions  Medication Sig Dispense Refill  . aspirin 81 MG tablet Take 81 mg by mouth daily.    Marland Kitchen atorvastatin (LIPITOR) 80 MG tablet Take 1 tablet (80 mg total) by mouth daily. 90 tablet 3  . buPROPion (WELLBUTRIN SR) 150 MG 12 hr tablet Take 150 mg by mouth daily.    . digoxin (LANOXIN) 0.125 MG tablet Take 0.0625 mg by mouth every other day.    . escitalopram (LEXAPRO) 20 MG tablet Take 20 mg by mouth daily.     Marland Kitchen ezetimibe (ZETIA) 10 MG tablet Take 1 tablet (10 mg total) by mouth daily. 90 tablet 3  . fenofibrate 160 MG tablet TAKE 1 TABLET BY MOUTH EVERY DAY 90 tablet 3  . hydrALAZINE (APRESOLINE) 25 MG tablet Take 1 tablet (25 mg total) by mouth 3 (three) times daily. 90 tablet 3  .  isosorbide mononitrate (IMDUR) 30 MG 24 hr tablet Take 2 tablets (60 mg total) by mouth 2 (two) times daily. 360 tablet 3  . metoprolol succinate (TOPROL-XL) 50 MG 24 hr tablet Take 1 tablet (50 mg total) by mouth daily. 90 tablet 3  . Omega-3 Fatty Acids (FISH OIL) 1000 MG CAPS Take 1 capsule by mouth 2 (two) times daily.    . Vitamin D, Ergocalciferol, (DRISDOL) 50000 UNITS CAPS Take 50,000 Units by mouth once a week. Every Wednesday     No current facility-administered medications for this visit.     Allergies:   Patient has no known allergies.    Social History:  The patient  reports that he quit smoking about 10 years ago.  His smoking use included Cigarettes. He has a 60.00 pack-year smoking history. He has never used smokeless tobacco. He reports that he drinks alcohol. He reports that he does not use drugs.   Family History:  The patient's family history includes Kidney failure in his father.    ROS:  Please see the history of present illness.   Otherwise, review of systems are positive for none.   All other systems are reviewed and negative.    PHYSICAL EXAM: VS:  BP 116/64   Pulse 70   Ht 6' (1.829 m)   Wt 186 lb 9.6 oz (84.6 kg)   BMI 25.31 kg/m  , BMI Body mass index is 25.31 kg/m. GEN: Well nourished, well developed, in no acute distress  HEENT: normal  Neck: no JVD, carotid bruits, or masses Cardiac: RRR; no murmurs, rubs, or gallops,no edema  Respiratory:  clear to auscultation bilaterally, normal work of breathing GI: soft, nontender, nondistended, + BS MS: no deformity or atrophy  Skin: warm and dry, no rash Neuro:  Strength and sensation are intact Psych: euthymic mood, full affect Vascular: Radial pulses are normal bilaterally. Femoral pulses are normal. Distal pulses are not palpable.  EKG:  EKG is not ordered today.    Recent Labs: 06/17/2016: B Natriuretic Peptide 92.3; BUN 12; Creatinine, Ser 1.95; Hemoglobin 14.6; Platelets 230; Potassium 4.5; Sodium 139    Lipid Panel    Component Value Date/Time   CHOL 171 10/10/2014 1159   TRIG 129 10/10/2014 1159   HDL 39 (L) 10/10/2014 1159   CHOLHDL 4.4 10/10/2014 1159   VLDL 26 10/10/2014 1159   LDLCALC 106 (H) 10/10/2014 1159      Wt Readings from Last 3 Encounters:  06/22/16 186 lb 9.6 oz (84.6 kg)  06/17/16 186 lb 8 oz (84.6 kg)  05/26/16 186 lb 12.8 oz (84.7 kg)       PAD Screen 06/22/2016  Previous PAD dx? Yes  Previous surgical procedure? Yes  Dates of procedures (No Data)  Pain with walking? Yes  Subsides with rest? Yes  Feet/toe relief with dangling? No  Painful, non-healing ulcers? No  Extremities  discolored? No      ASSESSMENT AND PLAN:  1.  Peripheral arterial disease: He has prolonged history of peripheral arterial disease since at least 2008. It appears that his SFA was probably occluded even then as his ABI was 0.45 at that time. His most recent noninvasive vascular evaluation last month showed actually better ABI bilaterally. His current symptoms include moderate claudication. He is also limited by shortness of breath related to his chronic systolic heart failure. Cilostazol is not recommended due to chronic systolic heart failure. I discussed further options with him including continued medical therapy and a walking program versus  angiography and possible revascularization. I think endovascular revascularization is probably limited given that the SFA is occluded and has been occluded for many years. It's also associated with risk of contrast-induced nephropathy as he has chronic kidney disease and one functioning kidney. Surgical revascularization is an option but I don't think his symptoms are bad enough to justify this route. I I recommend continuing medical therapy of risk factors. I encouraged him to start a daily walking program and follow-up with me on a yearly basis. Preserve angiography for worsening symptoms.  2. Chronic systolic heart failure: He appears to be euvolemic and his symptoms have been stable.  3. Hyperlipidemia: Currently on high dose atorvastatin, Zetia and fenofibrate.    Disposition:   FU with me in 1 year  Signed,  Kathlyn Sacramento, MD  06/22/2016 10:35 AM    [place]

## 2016-08-25 ENCOUNTER — Ambulatory Visit (INDEPENDENT_AMBULATORY_CARE_PROVIDER_SITE_OTHER): Payer: PPO | Admitting: *Deleted

## 2016-08-25 DIAGNOSIS — I5022 Chronic systolic (congestive) heart failure: Secondary | ICD-10-CM

## 2016-08-25 DIAGNOSIS — I255 Ischemic cardiomyopathy: Secondary | ICD-10-CM

## 2016-08-25 NOTE — Progress Notes (Signed)
Remote ICD transmission.   

## 2016-08-26 ENCOUNTER — Encounter: Payer: Self-pay | Admitting: Cardiology

## 2016-08-26 LAB — CUP PACEART REMOTE DEVICE CHECK
Battery Remaining Longevity: 118 mo
HIGH POWER IMPEDANCE MEASURED VALUE: 69 Ohm
Implantable Lead Implant Date: 20150302
Implantable Lead Model: 6935
Implantable Pulse Generator Implant Date: 20150302
Lead Channel Pacing Threshold Amplitude: 0.625 V
Lead Channel Pacing Threshold Pulse Width: 0.4 ms
Lead Channel Sensing Intrinsic Amplitude: 8.375 mV
Lead Channel Setting Pacing Pulse Width: 0.4 ms
MDC IDC LEAD LOCATION: 753860
MDC IDC MSMT BATTERY VOLTAGE: 3.02 V
MDC IDC MSMT LEADCHNL RV IMPEDANCE VALUE: 380 Ohm
MDC IDC MSMT LEADCHNL RV IMPEDANCE VALUE: 456 Ohm
MDC IDC MSMT LEADCHNL RV SENSING INTR AMPL: 8.375 mV
MDC IDC SESS DTM: 20180110093723
MDC IDC SET LEADCHNL RV PACING AMPLITUDE: 2.5 V
MDC IDC SET LEADCHNL RV SENSING SENSITIVITY: 0.3 mV
MDC IDC STAT BRADY RV PERCENT PACED: 0.01 %

## 2016-09-27 DIAGNOSIS — D638 Anemia in other chronic diseases classified elsewhere: Secondary | ICD-10-CM | POA: Diagnosis not present

## 2016-09-27 DIAGNOSIS — N2581 Secondary hyperparathyroidism of renal origin: Secondary | ICD-10-CM | POA: Diagnosis not present

## 2016-09-27 DIAGNOSIS — N183 Chronic kidney disease, stage 3 (moderate): Secondary | ICD-10-CM | POA: Diagnosis not present

## 2016-10-05 DIAGNOSIS — D638 Anemia in other chronic diseases classified elsewhere: Secondary | ICD-10-CM | POA: Diagnosis not present

## 2016-10-05 DIAGNOSIS — N2581 Secondary hyperparathyroidism of renal origin: Secondary | ICD-10-CM | POA: Diagnosis not present

## 2016-10-05 DIAGNOSIS — I10 Essential (primary) hypertension: Secondary | ICD-10-CM | POA: Diagnosis not present

## 2016-10-05 DIAGNOSIS — N183 Chronic kidney disease, stage 3 (moderate): Secondary | ICD-10-CM | POA: Diagnosis not present

## 2016-10-11 ENCOUNTER — Other Ambulatory Visit: Payer: Self-pay | Admitting: Family Medicine

## 2016-10-11 DIAGNOSIS — R1032 Left lower quadrant pain: Secondary | ICD-10-CM

## 2016-10-11 DIAGNOSIS — R109 Unspecified abdominal pain: Secondary | ICD-10-CM | POA: Diagnosis not present

## 2016-10-12 ENCOUNTER — Ambulatory Visit
Admission: RE | Admit: 2016-10-12 | Discharge: 2016-10-12 | Disposition: A | Discharge status: Home / Self Care | Payer: PPO | Source: Ambulatory Visit | Attending: Family Medicine | Admitting: Family Medicine

## 2016-10-12 DIAGNOSIS — R1032 Left lower quadrant pain: Secondary | ICD-10-CM | POA: Diagnosis not present

## 2016-10-13 ENCOUNTER — Encounter: Payer: Self-pay | Admitting: Oncology

## 2016-10-13 ENCOUNTER — Telehealth: Payer: Self-pay | Admitting: Oncology

## 2016-10-13 NOTE — Telephone Encounter (Signed)
Appt has been scheduled for the pt to see Dr. Benay Spice on 3/2 at [facility]. Pt aware to arrive 30 minutes early. Demographics verified. Pt agreed to the appt date and time.

## 2016-10-15 ENCOUNTER — Ambulatory Visit (HOSPITAL_BASED_OUTPATIENT_CLINIC_OR_DEPARTMENT_OTHER): Payer: PPO

## 2016-10-15 ENCOUNTER — Telehealth: Payer: Self-pay | Admitting: Oncology

## 2016-10-15 ENCOUNTER — Other Ambulatory Visit: Payer: Self-pay | Admitting: *Deleted

## 2016-10-15 ENCOUNTER — Ambulatory Visit (HOSPITAL_BASED_OUTPATIENT_CLINIC_OR_DEPARTMENT_OTHER): Payer: PPO | Admitting: Oncology

## 2016-10-15 VITALS — BP 126/89 | HR 86 | Temp 98.5°F | Resp 18 | Ht 72.0 in | Wt 185.5 lb

## 2016-10-15 DIAGNOSIS — N189 Chronic kidney disease, unspecified: Secondary | ICD-10-CM | POA: Diagnosis not present

## 2016-10-15 DIAGNOSIS — Z808 Family history of malignant neoplasm of other organs or systems: Secondary | ICD-10-CM

## 2016-10-15 DIAGNOSIS — K869 Disease of pancreas, unspecified: Secondary | ICD-10-CM

## 2016-10-15 DIAGNOSIS — K8689 Other specified diseases of pancreas: Secondary | ICD-10-CM

## 2016-10-15 DIAGNOSIS — I499 Cardiac arrhythmia, unspecified: Secondary | ICD-10-CM | POA: Diagnosis not present

## 2016-10-15 DIAGNOSIS — R599 Enlarged lymph nodes, unspecified: Secondary | ICD-10-CM | POA: Diagnosis not present

## 2016-10-15 DIAGNOSIS — I739 Peripheral vascular disease, unspecified: Secondary | ICD-10-CM | POA: Diagnosis not present

## 2016-10-15 DIAGNOSIS — E279 Disorder of adrenal gland, unspecified: Secondary | ICD-10-CM | POA: Diagnosis not present

## 2016-10-15 DIAGNOSIS — K769 Liver disease, unspecified: Secondary | ICD-10-CM | POA: Diagnosis not present

## 2016-10-15 DIAGNOSIS — Z87891 Personal history of nicotine dependence: Secondary | ICD-10-CM

## 2016-10-15 LAB — CEA (IN HOUSE-CHCC): CEA (CHCC-In House): 8.34 ng/mL — ABNORMAL HIGH (ref 0.00–5.00)

## 2016-10-15 LAB — COMPREHENSIVE METABOLIC PANEL
ALT: 36 U/L (ref 0–55)
ANION GAP: 10 meq/L (ref 3–11)
AST: 30 U/L (ref 5–34)
Albumin: 3.8 g/dL (ref 3.5–5.0)
Alkaline Phosphatase: 93 U/L (ref 40–150)
BUN: 14.5 mg/dL (ref 7.0–26.0)
CHLORIDE: 107 meq/L (ref 98–109)
CO2: 23 mEq/L (ref 22–29)
Calcium: 10.3 mg/dL (ref 8.4–10.4)
Creatinine: 2.1 mg/dL — ABNORMAL HIGH (ref 0.7–1.3)
EGFR: 34 mL/min/{1.73_m2} — ABNORMAL LOW (ref >90)
Glucose: 117 mg/dl (ref 70–140)
POTASSIUM: 4.4 meq/L (ref 3.5–5.1)
Sodium: 140 mEq/L (ref 136–145)
Total Bilirubin: 1.12 mg/dL (ref 0.20–1.20)
Total Protein: 8.1 g/dL (ref 6.4–8.3)

## 2016-10-15 MED ORDER — HYDROCODONE-ACETAMINOPHEN 5-325 MG PO TABS: 1.0000 | ORAL_TABLET | ORAL | 0 refills | Status: DC | PRN

## 2016-10-15 NOTE — Telephone Encounter (Signed)
Appointment scheduled per 3/2 LOS. Patient given AVS report and calendars with future scheduled appointments.

## 2016-10-15 NOTE — Progress Notes (Signed)
[facility] New Patient Consult   Referring MD: Maurice Small, Md [place],  [id number]   Logan Burton 61 y.o.  1956-02-01    Reason for Referral: Fullness of the pancreas head, a possible liver and adrenal metastases   HPI: Logan Burton reports a three-week history of intermittent of dominant pain. The pain occurs throughout the abdomen and in the lower back. He is not taking pain medication. He saw Logan Burton on 10/11/2016. A CT of the abdomen and pelvis on 10/12/2016 revealed multiple small nodules at the lung bases, a 3 cm ill-defined low-density in the right hepatic lobe concerning for metastasis. Pancreas head enlargement concerning for a pancreatic neoplasm. A 2 cm right adrenal nodule. Severe right renal atrophy. 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 Extensive adenopathy was noted in the superior retroperitoneum and porta hepatis. He has been referred to vascular surgery for evaluation of the aneurysm.  Past Medical History:  Diagnosis Date  . CHF (congestive heart failure) ([place])   . Chronic renal insufficiency   . Chronotropic incompetence with sinus node dysfunction ? Iatrogenic 01/22/2014  . Coronary artery disease    s/p CABG  . Dyslipidemia   . 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 Myocardial infarction   . PVD (peripheral vascular disease) ([place])     .  Atrophic right kidney   .  Left renal artery stent  Past Surgical History:  Procedure Laterality Date  . ANGIOPLASTY    . CERVICAL FUSION    . CORONARY ARTERY BYPASS GRAFT    . HERNIA REPAIR    . IMPLANTABLE CARDIOVERTER DEFIBRILLATOR IMPLANT  10/15/13   MDT single chamber ICD implanted by Dr Caryl Comes for primary prevention  . IMPLANTABLE CARDIOVERTER DEFIBRILLATOR IMPLANT N/A 10/15/2013   Procedure: IMPLANTABLE CARDIOVERTER DEFIBRILLATOR IMPLANT;  Surgeon: Deboraha Sprang, MD;  Location: [facility] CATH LAB;  Service: Cardiovascular;   Laterality: N/A;  . RIGHT HEART CATHETERIZATION N/A 09/20/2013   Procedure: RIGHT HEART CATH;  Surgeon: Jolaine Artist, MD;  Location: [facility] CATH LAB;  Service: Cardiovascular;  Laterality: N/A;    Medications: Reviewed  Allergies: No Known Allergies  Family history: A maternal aunt had "liver cancer ".  Social History:   He is originally from [place]. He lives alone in [place]. He previously worked in Barrister's clerk at [employer]. He quit smoking cigarettes in 2007. Rare alcohol use. He is disabled secondary to heart disease. He was transfused with surgery in the past. No risk factor for HIV or hepatitis.    ROS:   Positives include: Chronic exertional dyspnea, diffuse intermittent abdominal and lower back pain  A complete ROS was otherwise negative.  Physical Exam:  Blood pressure 126/89, pulse 86, temperature 98.5 F (36.9 C), temperature source Oral, resp. rate 18, height 6' (1.829 m), weight 185 lb 8 oz (84.1 kg), SpO2 99 %.  HEENT: Sclera anicteric, oropharynx without visible mass, upper and lower denture plate, neck without mass Lungs: Distant breath sounds, no respiratory distress Cardiac: Regular rate and rhythm Abdomen: No hepatosplenomegaly, no apparent ascites, no mass, nontender GU: Testes without mass  Vascular: No leg edema Lymph nodes: No cervical, supraclavicular, axillary, or inguinal nodes Neurologic: Alert and oriented, the motor exam appears intact in the upper and lower extremities Skin: No rash Musculoskeletal: No spine tenderness   LAB:  CBC-10/11/2016 at [facility] medicine, hemolymph 14.8, platelets 246,000, white count 11.2, ANC 8.0   CMP  Component Value Date/Time   NA 140 10/15/2016 1049   K 4.4 10/15/2016 1049   CL 108 06/17/2016 1558   CO2 23 10/15/2016 1049   GLUCOSE 117 10/15/2016 1049   BUN 14.5 10/15/2016 1049   CREATININE 2.1 (H) 10/15/2016 1049   CALCIUM 10.3 10/15/2016 1049   PROT 8.1 10/15/2016 1049   ALBUMIN 3.8  10/15/2016 1049   AST 30 10/15/2016 1049   ALT 36 10/15/2016 1049   ALKPHOS 93 10/15/2016 1049   BILITOT 1.12 10/15/2016 1049   GFRNONAA 35 (L) 06/17/2016 1558   GFRAA 41 (L) 06/17/2016 1558      Imaging:   CT abdomen/pelvis 10/12/2016: Images reviewed with Logan Burton and his family    Assessment/Plan:   1.   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   History of coronary artery disease  3.    Digestive heart failure  4.    Arrhythmia-defibrillator in place  5.    Chronic renal failure  6.     Atrophic right kidney  7.     BPH  8.     Peripheral vascular disease, left renal artery stent in place   Disposition:  Logan Burton presents with abdomen/back pain. The clinic presentation and CT images are consistent with a diagnosis of pancreas cancer. I discussed the probable diagnosis with Logan Burton and his family. We reviewed the CT images. We obtained a CA 19-9 today.  I contacted Logan Burton. He will see Logan Burton and perform an endoscopic ultrasound evaluation next week.  We will consider obtaining a staging PET scan or abdomen MRI since he is unable to receive CT IV contrast.    We gave him a prescription for hydrocodone to use as needed for pain.  He will return for an office visit and further discussion on 10/26/2016.  50 minutes were spent with the patient today. The majority of the time was used for counseling and coordination of care.       Betsy Coder, MD  10/15/2016, 2:14 PM

## 2016-10-16 LAB — CANCER ANTIGEN 19-9: CA 19-9: 272 U/mL — ABNORMAL HIGH (ref 0–35)

## 2016-10-18 ENCOUNTER — Encounter: Payer: PPO | Admitting: Surgery

## 2016-10-18 DIAGNOSIS — K869 Disease of pancreas, unspecified: Secondary | ICD-10-CM | POA: Diagnosis not present

## 2016-10-19 DIAGNOSIS — R160 Hepatomegaly, not elsewhere classified: Secondary | ICD-10-CM | POA: Diagnosis not present

## 2016-10-19 DIAGNOSIS — R1013 Epigastric pain: Secondary | ICD-10-CM | POA: Diagnosis not present

## 2016-10-19 DIAGNOSIS — R932 Abnormal findings on diagnostic imaging of liver and biliary tract: Secondary | ICD-10-CM | POA: Diagnosis not present

## 2016-10-19 DIAGNOSIS — K551 Chronic vascular disorders of intestine: Secondary | ICD-10-CM | POA: Diagnosis not present

## 2016-10-19 DIAGNOSIS — C229 Malignant neoplasm of liver, not specified as primary or secondary: Secondary | ICD-10-CM | POA: Diagnosis not present

## 2016-10-19 DIAGNOSIS — R591 Generalized enlarged lymph nodes: Secondary | ICD-10-CM | POA: Diagnosis not present

## 2016-10-19 DIAGNOSIS — R1011 Right upper quadrant pain: Secondary | ICD-10-CM | POA: Diagnosis not present

## 2016-10-19 DIAGNOSIS — R933 Abnormal findings on diagnostic imaging of other parts of digestive tract: Secondary | ICD-10-CM | POA: Diagnosis not present

## 2016-10-26 ENCOUNTER — Ambulatory Visit (HOSPITAL_BASED_OUTPATIENT_CLINIC_OR_DEPARTMENT_OTHER): Payer: PPO | Admitting: Nurse Practitioner

## 2016-10-26 ENCOUNTER — Telehealth: Payer: Self-pay | Admitting: Oncology

## 2016-10-26 ENCOUNTER — Ambulatory Visit (HOSPITAL_BASED_OUTPATIENT_CLINIC_OR_DEPARTMENT_OTHER): Payer: PPO

## 2016-10-26 ENCOUNTER — Telehealth: Payer: Self-pay | Admitting: Nurse Practitioner

## 2016-10-26 ENCOUNTER — Other Ambulatory Visit (HOSPITAL_COMMUNITY): Payer: Self-pay | Admitting: Internal Medicine

## 2016-10-26 VITALS — BP 118/50 | Temp 98.7°F | Resp 18 | Ht 72.0 in | Wt 181.8 lb

## 2016-10-26 DIAGNOSIS — R599 Enlarged lymph nodes, unspecified: Secondary | ICD-10-CM | POA: Diagnosis not present

## 2016-10-26 DIAGNOSIS — I739 Peripheral vascular disease, unspecified: Secondary | ICD-10-CM | POA: Diagnosis not present

## 2016-10-26 DIAGNOSIS — K869 Disease of pancreas, unspecified: Secondary | ICD-10-CM

## 2016-10-26 DIAGNOSIS — R17 Unspecified jaundice: Secondary | ICD-10-CM

## 2016-10-26 DIAGNOSIS — K769 Liver disease, unspecified: Secondary | ICD-10-CM

## 2016-10-26 DIAGNOSIS — R1011 Right upper quadrant pain: Secondary | ICD-10-CM | POA: Diagnosis not present

## 2016-10-26 DIAGNOSIS — I255 Ischemic cardiomyopathy: Secondary | ICD-10-CM

## 2016-10-26 LAB — CBC WITH DIFFERENTIAL/PLATELET
BASO%: 0.2 % (ref 0.0–2.0)
BASOS ABS: 0 10*3/uL (ref 0.0–0.1)
EOS%: 2.1 % (ref 0.0–7.0)
Eosinophils Absolute: 0.3 10*3/uL (ref 0.0–0.5)
HEMATOCRIT: 46 % (ref 38.4–49.9)
HEMOGLOBIN: 16 g/dL (ref 13.0–17.1)
LYMPH#: 1.3 10*3/uL (ref 0.9–3.3)
LYMPH%: 8.6 % — ABNORMAL LOW (ref 14.0–49.0)
MCH: 28.7 pg (ref 27.2–33.4)
MCHC: 34.8 g/dL (ref 32.0–36.0)
MCV: 82.4 fL (ref 79.3–98.0)
MONO#: 1.3 10*3/uL — AB (ref 0.1–0.9)
MONO%: 8.8 % (ref 0.0–14.0)
NEUT%: 80.3 % — ABNORMAL HIGH (ref 39.0–75.0)
NEUTROS ABS: 12.1 10*3/uL — AB (ref 1.5–6.5)
NRBC: 0 % (ref 0–0)
PLATELETS: 207 10*3/uL (ref 140–400)
RBC: 5.58 10*6/uL (ref 4.20–5.82)
RDW: 15.1 % — AB (ref 11.0–14.6)
WBC: 15 10*3/uL — ABNORMAL HIGH (ref 4.0–10.3)

## 2016-10-26 LAB — COMPREHENSIVE METABOLIC PANEL
ALBUMIN: 3.2 g/dL — AB (ref 3.5–5.0)
ALK PHOS: 433 U/L — AB (ref 40–150)
ALT: 253 U/L — AB (ref 0–55)
ANION GAP: 13 meq/L — AB (ref 3–11)
AST: 184 U/L (ref 5–34)
BILIRUBIN TOTAL: 11.83 mg/dL — AB (ref 0.20–1.20)
BUN: 21.3 mg/dL (ref 7.0–26.0)
CO2: 21 mEq/L — ABNORMAL LOW (ref 22–29)
CREATININE: 1.9 mg/dL — AB (ref 0.7–1.3)
Calcium: 11.5 mg/dL — ABNORMAL HIGH (ref 8.4–10.4)
Chloride: 101 mEq/L (ref 98–109)
EGFR: 37 mL/min/{1.73_m2} — AB (ref >90)
Glucose: 142 mg/dl — ABNORMAL HIGH (ref 70–140)
Potassium: 4 mEq/L (ref 3.5–5.1)
Sodium: 135 mEq/L — ABNORMAL LOW (ref 136–145)
TOTAL PROTEIN: 8.4 g/dL — AB (ref 6.4–8.3)

## 2016-10-26 MED ORDER — HYDROMORPHONE HCL 4 MG PO TABS: 4.0000 mg | ORAL_TABLET | ORAL | 0 refills | Status: DC | PRN

## 2016-10-26 NOTE — Telephone Encounter (Signed)
I left Logan Burton a message regarding the abnormal labs from today. Dr. Benay Spice would like for him to have an abdominal ultrasound tomorrow morning. This has been scheduled at [facility] radiology at 8:30. Instructions left that he should not eat or drink anything after midnight.

## 2016-10-26 NOTE — Telephone Encounter (Signed)
Appointments scheduled per 10/26/16 los. Patient was given a copyof the AVS report and appointment schedule, per 10/26/16 los.

## 2016-10-26 NOTE — Progress Notes (Addendum)
[place] OFFICE PROGRESS NOTE   Diagnosis:  Pancreas head fullness, possible liver/adrenal metastases, and abdominal lymphadenopathy  INTERVAL HISTORY:   Logan Burton returns as scheduled. He reports increased pain mainly concentrated at the right upper abdomen over the past week. Hydrocodone is not effective. He denies nausea/vomiting. Bowels are moving but he notes that stools are "flat". No blood. Appetite is poor. Urine is dark.  Objective:  Vital signs in last 24 hours:  Blood pressure (!) 118/50, temperature 98.7 F (37.1 C), resp. rate 18, height 6' (1.829 m), weight 181 lb 12.8 oz (82.5 kg), SpO2 96 %.    HEENT: Scleral icterus. Resp: Lungs clear bilaterally. Cardio: Regular rate and rhythm. GI: Liver is palpable throughout the right upper abdomen and crossing midline. There is associated tenderness. Vascular: No leg edema. Neuro: Alert and oriented.    Lab Results:  Lab Results  Component Value Date   WBC 10.2 06/17/2016   HGB 14.6 06/17/2016   HCT 44.3 06/17/2016   MCV 82.5 06/17/2016   PLT 230 06/17/2016   NEUTROABS 5.7 10/12/2013    Imaging:  No results found.  Medications: I have reviewed the patient's current medications.  Assessment/Plan: 1.   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 Minimally enlarged lymph nodes visualized in the gastrohepatic ligament, porta hepatis region, aortocaval region and upper abdominal periaortic region. Multiple round discrete lesions noted in the left lobe and visualized right lobe of the liver. Fine-needle aspiration performed.  2.   History of coronary artery disease  3.    Digestive heart failure  4.     Arrhythmia-defibrillator in place  5.    Chronic renal failure  6.     Atrophic right kidney  7.     BPH  8.     Peripheral vascular disease, left renal artery stent in place   Disposition: Logan Burton underwent an upper endoscopic ultrasound last week. Multiple liver lesions were noted with biopsies obtained. Final pathology is not available.   He has increased right-sided abdominal pain. On exam he appears jaundiced and the liver is significantly enlarged. He has had a change in bowel habits over the past week.  He will return to the lab today for a CBC, chemistry panel, hepatitis serologies, AFP.  He was given a prescription for Dilaudid 4 mg every 4 hours as needed. He understands to contact the office if this is not effective.  We will contact him once the final pathology report is available.  He will return for a follow-up visit on 10/29/2016.  Patient seen with Dr. Benay Burton. 25 minutes were spent face-to-face at today's visit with the majority of that time involved in counseling/coordination of care.    Logan Burton ANP/GNP-BC   10/26/2016  10:43 AM  This was a shared visit with Logan Burton. Logan Burton was interviewed and examined. He has increased pain and palpable hepatosplenomegaly. He will try Dilaudid for pain.  He was noted have marked hyperbilirubinemia and liver enzymes on a chemistry panel today. I discussed the case with Dr. Paulita Burton. He will be referred for an abdominal ultrasound tomorrow and then a noncontrast abdomen CT if needed. He may be a candidate for placement of a stent if there is significant bile duct dilatation. The liver enzymes changes can also  be related to progression of tumor within the liver.  We are waiting on the pathology report from the liver biopsy.  Logan Burton, M.D.

## 2016-10-27 ENCOUNTER — Ambulatory Visit (HOSPITAL_COMMUNITY)
Admission: RE | Admit: 2016-10-27 | Discharge: 2016-10-27 | Disposition: A | Discharge status: Home / Self Care | Payer: PPO | Source: Ambulatory Visit | Attending: Nurse Practitioner | Admitting: Nurse Practitioner

## 2016-10-27 DIAGNOSIS — R17 Unspecified jaundice: Secondary | ICD-10-CM

## 2016-10-27 DIAGNOSIS — K7689 Other specified diseases of liver: Secondary | ICD-10-CM | POA: Diagnosis not present

## 2016-10-27 DIAGNOSIS — K769 Liver disease, unspecified: Secondary | ICD-10-CM

## 2016-10-27 LAB — HEPATITIS B SURFACE ANTIGEN: HBsAg Screen: NEGATIVE

## 2016-10-27 LAB — HEPATITIS B CORE ANTIBODY, TOTAL: Hep B Core Ab, Tot: NEGATIVE

## 2016-10-27 LAB — HEPATITIS C ANTIBODY (REFLEX)

## 2016-10-27 LAB — AFP TUMOR MARKER: AFP, Serum, Tumor Marker: 2.4 ng/mL (ref 0.0–8.3)

## 2016-10-28 ENCOUNTER — Other Ambulatory Visit: Payer: Self-pay | Admitting: Nurse Practitioner

## 2016-10-28 ENCOUNTER — Ambulatory Visit (HOSPITAL_COMMUNITY): Admission: RE | Admit: 2016-10-28 | Payer: PPO | Source: Ambulatory Visit

## 2016-10-28 DIAGNOSIS — K769 Liver disease, unspecified: Secondary | ICD-10-CM

## 2016-10-29 ENCOUNTER — Telehealth: Payer: Self-pay

## 2016-10-29 ENCOUNTER — Other Ambulatory Visit: Payer: Self-pay

## 2016-10-29 ENCOUNTER — Encounter (HOSPITAL_COMMUNITY): Payer: Self-pay | Admitting: *Deleted

## 2016-10-29 ENCOUNTER — Ambulatory Visit (HOSPITAL_BASED_OUTPATIENT_CLINIC_OR_DEPARTMENT_OTHER): Payer: PPO

## 2016-10-29 ENCOUNTER — Telehealth: Payer: Self-pay | Admitting: Internal Medicine

## 2016-10-29 ENCOUNTER — Telehealth: Payer: Self-pay | Admitting: *Deleted

## 2016-10-29 ENCOUNTER — Ambulatory Visit (HOSPITAL_BASED_OUTPATIENT_CLINIC_OR_DEPARTMENT_OTHER): Payer: PPO | Admitting: Nurse Practitioner

## 2016-10-29 ENCOUNTER — Inpatient Hospital Stay (HOSPITAL_COMMUNITY)
Admission: AD | Admit: 2016-10-29 | Discharge: 2016-10-31 | DRG: 445 | Disposition: A | Discharge status: Home / Self Care | Payer: PPO | Source: Ambulatory Visit | Attending: Internal Medicine | Admitting: Internal Medicine

## 2016-10-29 ENCOUNTER — Ambulatory Visit (HOSPITAL_COMMUNITY)
Admission: RE | Admit: 2016-10-29 | Discharge: 2016-10-29 | Disposition: A | Discharge status: Home / Self Care | Payer: PPO | Source: Ambulatory Visit | Attending: Nurse Practitioner | Admitting: Nurse Practitioner

## 2016-10-29 VITALS — BP 94/69 | HR 83 | Temp 98.1°F | Resp 17 | Wt 180.9 lb

## 2016-10-29 DIAGNOSIS — N189 Chronic kidney disease, unspecified: Secondary | ICD-10-CM | POA: Diagnosis not present

## 2016-10-29 DIAGNOSIS — K769 Liver disease, unspecified: Secondary | ICD-10-CM

## 2016-10-29 DIAGNOSIS — N183 Chronic kidney disease, stage 3 unspecified: Secondary | ICD-10-CM

## 2016-10-29 DIAGNOSIS — I739 Peripheral vascular disease, unspecified: Secondary | ICD-10-CM | POA: Diagnosis not present

## 2016-10-29 DIAGNOSIS — F32A Depression, unspecified: Secondary | ICD-10-CM | POA: Diagnosis present

## 2016-10-29 DIAGNOSIS — I2510 Atherosclerotic heart disease of native coronary artery without angina pectoris: Secondary | ICD-10-CM | POA: Diagnosis present

## 2016-10-29 DIAGNOSIS — I130 Hypertensive heart and chronic kidney disease with heart failure and stage 1 through stage 4 chronic kidney disease, or unspecified chronic kidney disease: Secondary | ICD-10-CM | POA: Diagnosis not present

## 2016-10-29 DIAGNOSIS — Z87891 Personal history of nicotine dependence: Secondary | ICD-10-CM

## 2016-10-29 DIAGNOSIS — C801 Malignant (primary) neoplasm, unspecified: Secondary | ICD-10-CM | POA: Diagnosis not present

## 2016-10-29 DIAGNOSIS — R599 Enlarged lymph nodes, unspecified: Secondary | ICD-10-CM | POA: Diagnosis not present

## 2016-10-29 DIAGNOSIS — C787 Secondary malignant neoplasm of liver and intrahepatic bile duct: Secondary | ICD-10-CM | POA: Diagnosis not present

## 2016-10-29 DIAGNOSIS — K869 Disease of pancreas, unspecified: Secondary | ICD-10-CM

## 2016-10-29 DIAGNOSIS — R1011 Right upper quadrant pain: Secondary | ICD-10-CM

## 2016-10-29 DIAGNOSIS — I252 Old myocardial infarction: Secondary | ICD-10-CM

## 2016-10-29 DIAGNOSIS — Z515 Encounter for palliative care: Secondary | ICD-10-CM | POA: Diagnosis present

## 2016-10-29 DIAGNOSIS — Z981 Arthrodesis status: Secondary | ICD-10-CM

## 2016-10-29 DIAGNOSIS — J449 Chronic obstructive pulmonary disease, unspecified: Secondary | ICD-10-CM | POA: Diagnosis not present

## 2016-10-29 DIAGNOSIS — Z9889 Other specified postprocedural states: Secondary | ICD-10-CM | POA: Diagnosis not present

## 2016-10-29 DIAGNOSIS — K831 Obstruction of bile duct: Secondary | ICD-10-CM | POA: Diagnosis not present

## 2016-10-29 DIAGNOSIS — F329 Major depressive disorder, single episode, unspecified: Secondary | ICD-10-CM | POA: Diagnosis present

## 2016-10-29 DIAGNOSIS — K838 Other specified diseases of biliary tract: Secondary | ICD-10-CM | POA: Diagnosis not present

## 2016-10-29 DIAGNOSIS — K861 Other chronic pancreatitis: Secondary | ICD-10-CM | POA: Diagnosis not present

## 2016-10-29 DIAGNOSIS — R17 Unspecified jaundice: Secondary | ICD-10-CM | POA: Diagnosis not present

## 2016-10-29 DIAGNOSIS — Z841 Family history of disorders of kidney and ureter: Secondary | ICD-10-CM | POA: Diagnosis not present

## 2016-10-29 DIAGNOSIS — E785 Hyperlipidemia, unspecified: Secondary | ICD-10-CM | POA: Diagnosis not present

## 2016-10-29 DIAGNOSIS — Z7189 Other specified counseling: Secondary | ICD-10-CM | POA: Diagnosis not present

## 2016-10-29 DIAGNOSIS — I10 Essential (primary) hypertension: Secondary | ICD-10-CM | POA: Diagnosis not present

## 2016-10-29 DIAGNOSIS — R945 Abnormal results of liver function studies: Secondary | ICD-10-CM | POA: Diagnosis not present

## 2016-10-29 DIAGNOSIS — Z951 Presence of aortocoronary bypass graft: Secondary | ICD-10-CM

## 2016-10-29 DIAGNOSIS — Z95810 Presence of automatic (implantable) cardiac defibrillator: Secondary | ICD-10-CM

## 2016-10-29 DIAGNOSIS — Z66 Do not resuscitate: Secondary | ICD-10-CM | POA: Diagnosis present

## 2016-10-29 DIAGNOSIS — Z7982 Long term (current) use of aspirin: Secondary | ICD-10-CM

## 2016-10-29 DIAGNOSIS — I5022 Chronic systolic (congestive) heart failure: Secondary | ICD-10-CM | POA: Diagnosis present

## 2016-10-29 DIAGNOSIS — Z79899 Other long term (current) drug therapy: Secondary | ICD-10-CM | POA: Diagnosis not present

## 2016-10-29 LAB — COMPREHENSIVE METABOLIC PANEL
ALBUMIN: 2.7 g/dL — AB (ref 3.5–5.0)
ALK PHOS: 585 U/L — AB (ref 40–150)
ALT: 181 U/L — ABNORMAL HIGH (ref 0–55)
AST: 159 U/L — AB (ref 5–34)
Anion Gap: 11 mEq/L (ref 3–11)
BUN: 22.1 mg/dL (ref 7.0–26.0)
CO2: 23 mEq/L (ref 22–29)
Calcium: 11.6 mg/dL — ABNORMAL HIGH (ref 8.4–10.4)
Chloride: 98 mEq/L (ref 98–109)
Creatinine: 2.1 mg/dL — ABNORMAL HIGH (ref 0.7–1.3)
EGFR: 32 mL/min/{1.73_m2} — ABNORMAL LOW (ref >90)
Glucose: 165 mg/dl — ABNORMAL HIGH (ref 70–140)
POTASSIUM: 4.7 meq/L (ref 3.5–5.1)
Sodium: 133 mEq/L — ABNORMAL LOW (ref 136–145)
Total Bilirubin: 18.15 mg/dL (ref 0.20–1.20)
Total Protein: 7.5 g/dL (ref 6.4–8.3)

## 2016-10-29 MED ORDER — DIGOXIN 0.0625 MG HALF TABLET
0.0625 mg | ORAL_TABLET | ORAL | Status: DC
  Administered 2016-10-30: 0.0625 mg via ORAL
  Filled 2016-10-29: qty 1

## 2016-10-29 MED ORDER — HYDROMORPHONE HCL 1 MG/ML IJ SOLN
1.0000 mg | INTRAMUSCULAR | Status: AC | PRN
  Administered 2016-10-30: 1 mg via INTRAVENOUS
  Filled 2016-10-29: qty 1

## 2016-10-29 MED ORDER — ONDANSETRON HCL 4 MG/2ML IJ SOLN
4.0000 mg | Freq: Once | INTRAMUSCULAR | Status: AC
  Administered 2016-10-29: 4 mg via INTRAVENOUS
  Filled 2016-10-29: qty 2

## 2016-10-29 MED ORDER — METOPROLOL SUCCINATE ER 50 MG PO TB24
50.0000 mg | ORAL_TABLET | Freq: Every day | ORAL | Status: DC
  Administered 2016-10-29: 50 mg via ORAL
  Filled 2016-10-29: qty 1

## 2016-10-29 MED ORDER — HYDROMORPHONE HCL 2 MG PO TABS: 4.0000 mg | ORAL_TABLET | ORAL | Status: DC | PRN

## 2016-10-29 MED ORDER — ESCITALOPRAM OXALATE 20 MG PO TABS
20.0000 mg | ORAL_TABLET | Freq: Every day | ORAL | Status: DC
  Administered 2016-10-29 – 2016-10-31 (×3): 20 mg via ORAL
  Filled 2016-10-29 (×3): qty 1

## 2016-10-29 MED ORDER — ASPIRIN 81 MG PO CHEW
81.0000 mg | CHEWABLE_TABLET | Freq: Every day | ORAL | Status: DC
  Administered 2016-10-29: 81 mg via ORAL
  Filled 2016-10-29: qty 1

## 2016-10-29 MED ORDER — HYDROMORPHONE HCL 1 MG/ML IJ SOLN
1.0000 mg | Freq: Once | INTRAMUSCULAR | Status: AC
  Administered 2016-10-29: 1 mg via INTRAVENOUS
  Filled 2016-10-29: qty 1

## 2016-10-29 MED ORDER — FENOFIBRATE 160 MG PO TABS
160.0000 mg | ORAL_TABLET | Freq: Every day | ORAL | Status: DC
  Administered 2016-10-29 – 2016-10-31 (×3): 160 mg via ORAL
  Filled 2016-10-29 (×3): qty 1

## 2016-10-29 MED ORDER — ONDANSETRON HCL 4 MG PO TABS: 4.0000 mg | ORAL_TABLET | Freq: Four times a day (QID) | ORAL | Status: DC | PRN

## 2016-10-29 MED ORDER — BUPROPION HCL ER (SR) 150 MG PO TB12
150.0000 mg | ORAL_TABLET | Freq: Every day | ORAL | Status: DC
  Administered 2016-10-29 – 2016-10-31 (×3): 150 mg via ORAL
  Filled 2016-10-29 (×3): qty 1

## 2016-10-29 MED ORDER — SODIUM CHLORIDE 0.9 % IV SOLN
INTRAVENOUS | Status: DC
  Administered 2016-10-29 – 2016-10-31 (×2): via INTRAVENOUS

## 2016-10-29 MED ORDER — ISOSORBIDE MONONITRATE ER 60 MG PO TB24
60.0000 mg | ORAL_TABLET | Freq: Two times a day (BID) | ORAL | Status: DC
  Administered 2016-10-29: 60 mg via ORAL
  Filled 2016-10-29: qty 1

## 2016-10-29 MED ORDER — HYDRALAZINE HCL 25 MG PO TABS
25.0000 mg | ORAL_TABLET | Freq: Three times a day (TID) | ORAL | Status: DC
Start: 2016-10-29 — End: 2016-10-30
  Administered 2016-10-29: 25 mg via ORAL
  Filled 2016-10-29: qty 1

## 2016-10-29 MED ORDER — EZETIMIBE 10 MG PO TABS
10.0000 mg | ORAL_TABLET | Freq: Every day | ORAL | Status: DC
  Administered 2016-10-29 – 2016-10-31 (×3): 10 mg via ORAL
  Filled 2016-10-29 (×3): qty 1

## 2016-10-29 MED ORDER — ONDANSETRON HCL 4 MG/2ML IJ SOLN: 4.0000 mg | Freq: Four times a day (QID) | INTRAMUSCULAR | Status: DC | PRN

## 2016-10-29 NOTE — Telephone Encounter (Signed)
  PENDING ACCEPTANCE TRANFER NOTE:  Call received from:    Dr Benay Spice  REASON FOR REQUESTING TRANSFER:    Obstructive Juandice  CC: Abd pain and jaundice  HPI:   61 yo male with history of CHF, arrhythmia S/P ICD in place, recently diagnosed with adenocarcinoma with mets to the liver, but unknown primary (thought initially to be pancreatic, currently final path pending at [facility]). Came in to the office with abd pain and worsening jaundice, his total Bili is 18, non-contrast CT showed intrahepatic lesion, he will need GI for ERCP and possible stent placement.    PLAN:  According to telephone report, this patient was accepted for transfer to [facility], under [place] team:  MCAdmit,  I have requested an order be written to call Flow Manager at [phone] upon patient arrival to the floor for final physician assignment who will do the admission and give admitting orders.  SIGNED: Birdie Hopes, MD [facility]  10/29/2016, 4:57 PM

## 2016-10-29 NOTE — Telephone Encounter (Signed)
Spoke with pt's brother. He reports they have left the facility. Instructions given to return to [facility] for admission. Per the brother, pt was agreeable to returning.

## 2016-10-29 NOTE — Telephone Encounter (Signed)
Patient called asking to have his CT done before his appt with [name] today, states he never knew of the appt from yesterday. Spoke with CT, able to work pt in. Attempted to call patient back and unable to reach. Called patients sister [name] who will try and get in touch with patient and will have him call.

## 2016-10-29 NOTE — H&P (Signed)
History and Physical    Logan Burton WJX:[id number] DOB: Nov 15, 1955 DOA: 10/29/2016  PCP: Jonathon Bellows, MD   Patient coming from: Home.  I have personally briefly reviewed patient's old medical records in [place]  Chief Complaint: Jaundice.  HPI: Logan Burton is a 61 y.o. 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 He complains of decreased appetite, epigastric and RUQ pain, but denies nausea, emesis, diarrhea, melena or hematochezia. He denies fever, chills, sore throat, productive cough, chest pain, palpitations, dyspnea, lower extremity edema, but feels more fatigued.   Total bilirubin is 18.15 mg/dL. Alkaline phosphatase 585, AST 159, ALT of 181 units. BUN 22.1, creatinine 2.1 and glucose 165 mg/dL. Repeat CT scan of the abdomen shows new intrahepatic biliary tree dilation.    Review of Systems: As per HPI otherwise 10 point review of systems negative.    Past Medical History:  Diagnosis Date  . CHF (congestive heart failure) ([place])   . Chronic renal insufficiency   . Chronotropic incompetence with sinus node dysfunction ? Iatrogenic 01/22/2014  . Coronary artery disease    s/p CABG  . Dyslipidemia   . 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 Myocardial infarction   . PVD (peripheral vascular disease) ([place])     Past Surgical History:  Procedure Laterality Date  . ANGIOPLASTY    . CERVICAL FUSION    . CORONARY ARTERY BYPASS GRAFT    . HERNIA REPAIR    . IMPLANTABLE CARDIOVERTER DEFIBRILLATOR IMPLANT  10/15/13   MDT single chamber ICD implanted by Dr Caryl Comes for primary prevention  . IMPLANTABLE CARDIOVERTER DEFIBRILLATOR IMPLANT N/A 10/15/2013   Procedure: IMPLANTABLE CARDIOVERTER DEFIBRILLATOR IMPLANT;  Surgeon: Deboraha Sprang, MD;  Location: [facility] CATH LAB;  Service: Cardiovascular;  Laterality: N/A;  . RIGHT HEART CATHETERIZATION N/A 09/20/2013   Procedure: RIGHT HEART CATH;  Surgeon: Jolaine Artist, MD;  Location: [facility] CATH LAB;  Service: Cardiovascular;  Laterality: N/A;     reports that he quit smoking about 10 years ago. His smoking use included Cigarettes. He has a 60.00 pack-year smoking history. He has never used smokeless tobacco. He reports that he drinks alcohol. He reports that he does not use drugs.  Allergies  Allergen Reactions  . Tuberculin Tests     Pt states he had tb vaccine in his country. He states that if he now has a tb skin test for tuberculosis - it will always be positive. Pt states it is dangerous to have the skin tb tests.    Family History  Problem Relation Age of Onset  . Kidney failure Father     Prior to Admission medications   Medication Sig Start Date End Date Taking? Authorizing Provider  aspirin 81 MG tablet Take 81 mg by mouth daily.    Historical Provider, MD  atorvastatin (LIPITOR) 80 MG tablet Take 1 tablet (80 mg total) by mouth daily. 08/27/15   Jolaine Artist, MD  buPROPion (WELLBUTRIN SR) 150 MG 12 hr tablet Take 150 mg by mouth daily.    Historical Provider, MD  digoxin (LANOXIN) 0.125 MG tablet Take 0.0625 mg by mouth every other day.    Historical Provider, MD  escitalopram (LEXAPRO) 20 MG tablet Take 20 mg by mouth daily.  12/18/12   Historical Provider, MD  ezetimibe (ZETIA) 10 MG tablet Take 1 tablet (10 mg total) by mouth daily. 02/13/16   Jolaine Artist, MD  fenofibrate 160 MG tablet TAKE ONE TABLET BY MOUTH ONCE DAILY 10/27/16   Jolaine Artist, MD  hydrALAZINE (APRESOLINE) 25 MG tablet Take 1 tablet (25 mg total) by mouth 3 (three) times daily. 05/26/16   Deboraha Sprang, MD  HYDROcodone-acetaminophen (NORCO/VICODIN) 5-325 MG tablet Take 1 tablet by mouth every 4 (four)  hours as needed for moderate pain. 10/15/16   Ladell Pier, MD  HYDROmorphone (DILAUDID) 4 MG tablet Take 1 tablet (4 mg total) by mouth every 4 (four) hours as needed for severe pain. 10/26/16   Owens Shark, NP  isosorbide mononitrate (IMDUR) 30 MG 24 hr tablet Take 2 tablets (60 mg total) by mouth 2 (two) times daily. 02/26/16   Larey Dresser, MD  metoprolol succinate (TOPROL-XL) 50 MG 24 hr tablet Take 1 tablet (50 mg total) by mouth daily. 05/17/16   Jolaine Artist, MD  Omega-3 Fatty Acids (FISH OIL) 1000 MG CAPS Take 1 capsule by mouth 2 (two) times daily.    Historical Provider, MD  Vitamin D, Ergocalciferol, (DRISDOL) 50000 UNITS CAPS Take 50,000 Units by mouth once a week. Every Wednesday 11/23/12   Historical Provider, MD    Physical Exam:  Constitutional: NAD, calm, comfortable Vitals:   10/29/16 1800 10/29/16 1802  BP:  114/74  Pulse:  81  Resp:  18  Temp:  97.7 F (36.5 C)  TempSrc:  Oral  SpO2:  98%  Weight: 82.1 kg (181 lb)   Height: 6' (1.829 m)    Eyes: PERRL, Scleral jaundice, lids and conjunctivae normal ENMT: Mucous membranes are moist. Posterior pharynx clear of any exudate or lesions. Neck: normal, supple, no masses, no thyromegaly Respiratory: clear to auscultation bilaterally, no wheezing, no crackles. Normal respiratory effort. No accessory muscle use.  Cardiovascular: Regular rate and rhythm, no murmurs / rubs / gallops. No extremity edema. 2+ pedal pulses. No carotid bruits.  Abdomen: Soft, positive epigastric and RUQ tenderness, no guarding/rebound/masses palpated. No hepatosplenomegaly. Bowel sounds positive.  Musculoskeletal: no clubbing / cyanosis. Good ROM, no contractures. Normal muscle tone.  Skin: Positive jaundice. Neurologic: CN 2-12 grossly intact. Sensation intact, DTR normal. Strength 5/5 in all 4.  Psychiatric: Normal judgment and insight. Alert and oriented x 3. Normal mood.    Labs on Admission: I have personally reviewed following  labs and imaging studies  CBC:  Recent Labs Lab 10/26/16 1148  WBC 15.0*  NEUTROABS 12.1*  HGB 16.0  HCT 46.0  MCV 82.4  PLT 254   Basic Metabolic Panel:  Recent Labs Lab 10/26/16 1148 10/29/16 1320  NA 135* 133*  K 4.0 4.7  CO2 21* 23  GLUCOSE 142* 165*  BUN 21.3 22.1  CREATININE 1.9* 2.1*  CALCIUM 11.5* 11.6*   GFR: Estimated Creatinine Clearance: 40 mL/min (A) (by C-G formula based on SCr of 2.1 mg/dL (H)). Liver Function Tests:  Recent Labs Lab 10/26/16 1148 10/29/16 1320  AST 184* 159*  ALT 253* 181*  ALKPHOS 433* 585*  BILITOT 11.83* 18.15*  PROT 8.4* 7.5  ALBUMIN 3.2* 2.7*   No results for input(s): LIPASE, AMYLASE in the last 168 hours. No results for input(s): AMMONIA in the last 168 hours. Coagulation Profile: No results for input(s): INR, PROTIME in the last 168 hours. Cardiac Enzymes: No results for input(s): CKTOTAL, CKMB, CKMBINDEX, TROPONINI in the  last 168 hours. BNP (last 3 results) No results for input(s): PROBNP in the last 8760 hours. HbA1C: No results for input(s): HGBA1C in the last 72 hours. CBG: No results for input(s): GLUCAP in the last 168 hours. Lipid Profile: No results for input(s): CHOL, HDL, LDLCALC, TRIG, CHOLHDL, LDLDIRECT in the last 72 hours. Thyroid Function Tests: No results for input(s): TSH, T4TOTAL, FREET4, T3FREE, THYROIDAB in the last 72 hours. Anemia Panel: No results for input(s): VITAMINB12, FOLATE, FERRITIN, TIBC, IRON, RETICCTPCT in the last 72 hours. Urine analysis: No results found for: COLORURINE, APPEARANCEUR, LABSPEC, [place], GLUCOSEU, HGBUR, BILIRUBINUR, KETONESUR, PROTEINUR, UROBILINOGEN, NITRITE, LEUKOCYTESUR  Radiological Exams on Admission: Ct Abdomen Wo Contrast  Result Date: 10/29/2016 CLINICAL DATA:  Hyperbilirubinemia. EXAM: CT ABDOMEN WITHOUT CONTRAST TECHNIQUE: Multidetector CT imaging of the abdomen was performed following the standard protocol without IV contrast. COMPARISON:   10/12/2016. FINDINGS: Lower chest: There are multiple pulmonary nodules identified within both lung bases. Compared with previous exam these are increased in number suspicious for metastatic disease. Hepatobiliary: The liver has an irregular contour compatible with cirrhosis. New intrahepatic bile duct dilatation. Mass within segment 5 of the liver measures 5.5 cm, image 34 of series 2. Previously 4.8 cm. There is diffuse gallbladder wall thickening. Pancreas: Exam detail is diminished due to lack of IV and oral contrast material. Mass in the region of the porta hepatis and head of pancreas is again noted. Difficult to assess due to limitations mentioned above. This measures approximately 4.3 x 4.5 cm, image 25 of series 2. Head, body and tail of pancreas appear normal. Spleen: Normal in size without focal abnormality. Adrenals/Urinary Tract: Similar appearance of right adrenal nodule measuring 1.6 cm, image 21 of series 2. Normal appearance of the left adrenal gland. Unremarkable appearance of the left kidney. The right kidney appears atrophic. Stomach/Bowel: Stomach is within normal limits. No pathologic dilatation of the visualized upper abdominal bowel loops. Vascular/Lymphatic: Aortic atherosclerosis. Infrarenal abdominal aortic aneurysm measures 3.5 cm, image 41 of series 2. Retroperitoneal adenopathy is again identified. Index periaortic node measures 1.8 cm, image 33 of series 2. Previously 1.4 cm. Lymph node adjacent to the superior mesenteric artery measures 1.9 cm, image 27 of series 2. Previously 1.6 cm. Enlarged retrocaval node measures 1.5 cm, image 33 of series 2. Previously 1.7 cm. Other: No ascites.  No focal fluid collections. Musculoskeletal: No aggressive lytic or sclerotic bone lesions. IMPRESSION: 1. Interval development of intrahepatic bile duct dilatation. This likely reflects mass effect due to lesion in the region of the head of pancreas/porta hepatis. There is also wall thickening  involving the gallbladder which may reflect obstruction of the cystic duct. 2. Right lobe of liver metastases is again noted and appears slightly increased in size from previous exam. 3. Metastatic adenopathy noted within the upper abdomen. 4. Aortic atherosclerosis 5. Infrarenal abdominal aortic aneurysm. Recommend followup by ultrasound in 2 years. This recommendation follows ACR consensus guidelines: White Paper of the ACR Incidental Findings Committee II on Vascular Findings. J Am Coll Radiol 2013; 10:789-794. Electronically Signed   By: Kerby Moors M.D.   On: 10/29/2016 13:55     Assessment/Plan Principal Problem:   Obstructive jaundice Admit to MedSurg/inpatient. Continue IV fluids. Clear liquids diet. Nothing by mouth after midnight. Follow-up CMP, PT/INR and CBC in a.m. Will need GI evaluation in a.m. Most likely will undergo ERCP with CBD stent placement.  Active Problems:   Essential hypertension Continue hydralazine 25 mg by mouth daily. Continue metoprolol ER 50 mg  by mouth daily.    Coronary artery disease Continue aspirin and beta blocker. Hold atorvastatin.    Chronic systolic CHF (congestive heart failure) (HCC) Compensated. Continue the digoxin 62.5 g by mouth daily. Continue hydralazine 25 mg by mouth daily. Continue isosorbide mononitrate 60 mg by mouth twice a day. Continue metoprolol ER 50 mg by mouth daily.    Chronic renal insufficiency Stable. Monitor electrolytes, BUN and creatinine.    Dyslipidemia Hold statin due to abnormal LFTs and jaundice.    COPD GOLD 0 Stable at this time. Supplemental oxygen and bronchodilators as needed.    Depression Continue Wellbutrin 150 mg by mouth daily. Continue Lexapro 20 mg by mouth daily.    DVT prophylaxis: SCDs. Code Status: Full code. Family Communication: His sister and niece were present. The patient has also given permission to speak to his nephew Dr. Dwyane Dee who can be reached at  [phone]. Disposition Plan: Admit for GI evaluation in a.m. Consults called: Paged Dr. Carol Ada, awaiting response. Dr. Paulita Fujita was contacted by oncology team earlier in the day, however it is unclear if he will be seeing the patient. Admission status: Inpatient/MedSurg.   Reubin Milan MD [facility] Pager [phone]  If 7PM-7AM, please contact night-coverage [email or web address] Password [email or web address]  10/29/2016, 7:12 PM

## 2016-10-29 NOTE — Progress Notes (Addendum)
[place] OFFICE PROGRESS NOTE   Diagnosis:  Pancreas head fullness, possible liver/adrenal metastases, and abdominal lymphadenopathy  INTERVAL HISTORY:   Logan Burton returns as scheduled. He continues to have right-sided abdominal pain. He notes partial relief with Dilaudid 4 mg. He has intermittent episodes of mild nausea. No vomiting. Oral intake is poor. Bowels are moving. Urine is dark. He is having sweats. He denies associated fever. He denies pruritus.  Objective:  Vital signs in last 24 hours:  Blood pressure 94/69, pulse 83, temperature 98.1 F (36.7 C), temperature source Oral, resp. rate 17, weight 180 lb 14.4 oz (82.1 kg), SpO2 96 %.    HEENT: Scleral icterus. Lips/mouth appear dry. Resp: Lungs clear bilaterally. Cardio: Regular rate and rhythm. GI: Liver is palpable throughout the right upper abdomen and crossing midline. There is associated tenderness. Vascular: No leg edema. Neuro: Alert and oriented.  Skin: Jaundiced. Decreased skin turgor.   Lab Results:  Lab Results  Component Value Date   WBC 15.0 (H) 10/26/2016   HGB 16.0 10/26/2016   HCT 46.0 10/26/2016   MCV 82.4 10/26/2016   PLT 207 10/26/2016   NEUTROABS 12.1 (H) 10/26/2016    Imaging:  No results found.  Medications: I have reviewed the patient's current medications.  Assessment/Plan: 1. 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 Minimally enlarged lymph nodes visualized in the gastrohepatic ligament, porta hepatis region, aortocaval region and upper abdominal periaortic region. Multiple round discrete lesions noted in the left lobe and visualized right lobe of the liver. Fine-needle  aspiration performed. Preliminary pathology consistent with adenocarcinoma.   Abdominal CT 10/29/2016 with interval development of intrahepatic bile duct dilatation likely due to mass effect from lesion in the region of the head of the pancreas/porta hepatis. There is also wall thickening involving the gallbladder which may reflect obstruction of the cystic duct. Right lobe of liver metastasis again noted. Metastatic adenopathy noted within the upper abdomen.  2. History of coronary artery disease  3. Congestive heart failure  4. Arrhythmia-defibrillator in place  5. Chronic renal failure  6. Atrophic right kidney  7. BPH  8. Peripheral vascular disease, left renal artery stent in place  9.   Hyperbilirubinemia and hepatomegaly 10/26/2016. Abdominal ultrasound 10/27/2016 with multiple hepatic lesions consistent with metastatic disease. Abdominal CT 10/29/2016 with interval development of intrahepatic bile duct dilatation likely due to mass effect from lesion in the region of the head of the pancreas/porta hepatis. There is also wall thickening involving the gallbladder which may reflect obstruction of the cystic duct. Right lobe of liver metastasis again noted. Metastatic adenopathy noted within the upper abdomen.   Disposition: [name] has progressive hyperbilirubinemia. This appears to be due to bile duct obstruction from adenopathy. Dr. Benay Spice has spoken to Dr. Paulita Fujita regarding placement of a biliary stent to relieve the obstruction. Due to the level of hyperbilirubinemia and other comorbid conditions we are recommending hospitalization for GI consultation/biliary stent placement. Mr. Hamblen is in agreement with this plan.  We reviewed with [name] and his brother that the preliminary pathology from the FNA of a liver lesion indicates adenocarcinoma. Final pathology is pending. We discussed that another biopsy may be necessary to establish a definitive  diagnosis.  He understands at this point regardless of the diagnosis the hyperbilirubinemia  must be addressed prior to discussing initiation of any treatment.  Patient seen with Dr. Benay Spice. 45 minutes were spent face-to-face at today's visit. The majority of that time was involved in counseling/coordination of care.  Logan Burton ANP/GNP-BC   10/29/2016  1:56 PM This was a shared visit with Logan Burton. We reviewed the CT images with Logan Burton and his brother. He has developed intrahepatic biliary dilatation, likely taken every 2 porta hepatis adenopathy. I discussed the case with Dr. Paulita Fujita. He recommends hospital admission in order to undergo an ERCP procedure, hopefully this weekend.   We are waiting on the final pathology report from the EUS biopsy of the liver. If this is inconclusive he will need an ultrasound-guided biopsy of a liver lesion to obtain core samples.  We will request admission to the hospitalist service secondary to the marked hyperbilirubinemia and multiple comorbid conditions.  I will check on him 11/01/2016.  Julieanne Manson, M.D.

## 2016-10-29 NOTE — Telephone Encounter (Signed)
Patient called back, he is on his way for CT scan. Called central scheduling for an appt. They will work pt in.

## 2016-10-30 DIAGNOSIS — N183 Chronic kidney disease, stage 3 (moderate): Secondary | ICD-10-CM

## 2016-10-30 DIAGNOSIS — I5022 Chronic systolic (congestive) heart failure: Secondary | ICD-10-CM

## 2016-10-30 DIAGNOSIS — E785 Hyperlipidemia, unspecified: Secondary | ICD-10-CM

## 2016-10-30 DIAGNOSIS — I10 Essential (primary) hypertension: Secondary | ICD-10-CM

## 2016-10-30 DIAGNOSIS — K838 Other specified diseases of biliary tract: Secondary | ICD-10-CM

## 2016-10-30 LAB — COMPREHENSIVE METABOLIC PANEL
ALT: 143 U/L — ABNORMAL HIGH (ref 17–63)
AST: 136 U/L — ABNORMAL HIGH (ref 15–41)
Albumin: 2.6 g/dL — ABNORMAL LOW (ref 3.5–5.0)
Alkaline Phosphatase: 399 U/L — ABNORMAL HIGH (ref 38–126)
Anion gap: 12 (ref 5–15)
BILIRUBIN TOTAL: 18.2 mg/dL — AB (ref 0.3–1.2)
BUN: 28 mg/dL — ABNORMAL HIGH (ref 6–20)
CHLORIDE: 95 mmol/L — AB (ref 101–111)
CO2: 24 mmol/L (ref 22–32)
Calcium: 10.4 mg/dL — ABNORMAL HIGH (ref 8.9–10.3)
Creatinine, Ser: 2.07 mg/dL — ABNORMAL HIGH (ref 0.61–1.24)
GFR, EST AFRICAN AMERICAN: 38 mL/min — AB (ref >60)
GFR, EST NON AFRICAN AMERICAN: 33 mL/min — AB (ref >60)
Glucose, Bld: 107 mg/dL — ABNORMAL HIGH (ref 65–99)
POTASSIUM: 4 mmol/L (ref 3.5–5.1)
Sodium: 131 mmol/L — ABNORMAL LOW (ref 135–145)
TOTAL PROTEIN: 6.8 g/dL (ref 6.5–8.1)

## 2016-10-30 LAB — CBC WITH DIFFERENTIAL/PLATELET
BASOS PCT: 0 %
Basophils Absolute: 0 10*3/uL (ref 0.0–0.1)
Eosinophils Absolute: 0.5 10*3/uL (ref 0.0–0.7)
Eosinophils Relative: 4 %
HEMATOCRIT: 37.7 % — AB (ref 39.0–52.0)
Hemoglobin: 12.3 g/dL — ABNORMAL LOW (ref 13.0–17.0)
LYMPHS PCT: 9 %
Lymphs Abs: 1.2 10*3/uL (ref 0.7–4.0)
MCH: 27.4 pg (ref 26.0–34.0)
MCHC: 32.6 g/dL (ref 30.0–36.0)
MCV: 84 fL (ref 78.0–100.0)
MONOS PCT: 9 %
Monocytes Absolute: 1.2 10*3/uL — ABNORMAL HIGH (ref 0.1–1.0)
NEUTROS ABS: 10.3 10*3/uL — AB (ref 1.7–7.7)
Neutrophils Relative %: 78 %
PLATELETS: 164 10*3/uL (ref 150–400)
RBC: 4.49 MIL/uL (ref 4.22–5.81)
RDW: 16 % — ABNORMAL HIGH (ref 11.5–15.5)
WBC: 13.2 10*3/uL — ABNORMAL HIGH (ref 4.0–10.5)

## 2016-10-30 LAB — DIGOXIN LEVEL

## 2016-10-30 LAB — BILIRUBIN, DIRECT: Bilirubin, Direct: 14.6 mg/dL — ABNORMAL HIGH (ref 0.1–0.5)

## 2016-10-30 LAB — PROTIME-INR
INR: 1.23
Prothrombin Time: 15.6 seconds — ABNORMAL HIGH (ref 11.4–15.2)

## 2016-10-30 LAB — MRSA PCR SCREENING: MRSA by PCR: NEGATIVE

## 2016-10-30 MED ORDER — HYDROMORPHONE HCL 1 MG/ML IJ SOLN
1.0000 mg | INTRAMUSCULAR | Status: AC | PRN
Start: 2016-10-30 — End: 2016-10-30
  Administered 2016-10-30: 1 mg via INTRAVENOUS
  Filled 2016-10-30: qty 1

## 2016-10-30 MED ORDER — SENNOSIDES-DOCUSATE SODIUM 8.6-50 MG PO TABS
2.0000 | ORAL_TABLET | Freq: Two times a day (BID) | ORAL | Status: DC
  Administered 2016-10-30 – 2016-10-31 (×3): 2 via ORAL
  Filled 2016-10-30 (×3): qty 2

## 2016-10-30 MED ORDER — POLYETHYLENE GLYCOL 3350 17 G PO PACK: 17.0000 g | PACK | Freq: Every day | ORAL | Status: DC | PRN

## 2016-10-30 MED ORDER — OXYCODONE HCL ER 15 MG PO T12A
15.0000 mg | EXTENDED_RELEASE_TABLET | Freq: Two times a day (BID) | ORAL | Status: DC
  Administered 2016-10-30 – 2016-10-31 (×3): 15 mg via ORAL
  Filled 2016-10-30 (×3): qty 1

## 2016-10-30 MED ORDER — MORPHINE SULFATE ER 15 MG PO TBCR: 15.0000 mg | EXTENDED_RELEASE_TABLET | Freq: Two times a day (BID) | ORAL | Status: DC

## 2016-10-30 MED ORDER — HYDROMORPHONE HCL 2 MG PO TABS: 4.0000 mg | ORAL_TABLET | ORAL | Status: DC | PRN

## 2016-10-30 MED ORDER — METOPROLOL SUCCINATE ER 25 MG PO TB24
25.0000 mg | ORAL_TABLET | Freq: Every day | ORAL | Status: DC
  Administered 2016-10-31: 25 mg via ORAL
  Filled 2016-10-30: qty 1

## 2016-10-30 NOTE — Progress Notes (Signed)
Patient with medical history significant of CHF, chronic renal insufficiency, CAD/history of MI and CABG, hyperlipidemia, implantable defibrillator, PVD who has been recently diagnosed with metastatic liver disease presents with worsening abdominal pain.  He was found to have obstructive jaundice with Tbili of 18.  IR consulted for possible biliary drain placemen at the request of Dr. Alessandra Bevels. Discussion held between Dr. Waldron Labs and Dr. Laurence Ferrari regarding symptom management and possible interventions given patient's advanced disease and main complaint of pain. Note patient is currently working with medical team for possible transfer to tertiary care center vs. Palliative care with comfort measures.  Interventions in IR may still be considered and possible for patient, however given degree of uncertainty today regarding care plan IR will reassess tomorrow and provide input as warranted by medical team and patient.   Brynda Greathouse, MS RD PA-C 2:22 PM

## 2016-10-30 NOTE — Progress Notes (Signed)
Text MD that Total Bilirubin is critical this morning at 18.2. (previous 18.15)

## 2016-10-30 NOTE — Progress Notes (Signed)
-   Received call from [place]. Patient is accepted there for transfer for ERCP.  - D/W Dr. Waldron Labs regarding transfer but patient now wants Palliative care and no chemo.  - will cancel transfer request for now

## 2016-10-30 NOTE — Consult Note (Signed)
Referring Provider:   Direct admit from GI /Dr. Paulita Fujita  Primary Care Physician:  Jonathon Bellows, MD Primary Gastroenterologist:  Dr. Paulita Fujita   Reason for Consultation:  Jaundice  HPI: Logan Burton is a 61 y.o. male past medical history of CHF, coronary artery disease who was seen in the GI for evaluation of possible pancreatic lesion. Patient underwent CT scan on 10/12/2016 for  abdominal pain which revealed possible pancreatic head enlargement and 3 cm lesion in the right hepatic lobe concerning for metastatic lesion. Patient underwent EUS by Dr. Paulita Fujita  on 10/19/2016 which showed no discrete pancreatic mass but showed multiple liver lesions and lymphadenopathy. 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 Patient then started having  worsening epigastric pain and jaundice around 1 week ago. Repeat CT scan showed intrahepatic biliary ductal dilatation. Bilirubin is now up to 18. He is now admitted to the hospital for ERCP and possible stent placement for biliary decompression.  Patient currently denied any chest pain or difficulty breathing. Continues to have epigastric discomfort and some nausea. Denied vomiting. Denied diarrhea or constipation. Denied blood in the stool or black stool.    Past Medical History:  Diagnosis Date  . CHF (congestive heart failure) ([place])   . Chronic renal insufficiency   . Chronotropic incompetence with sinus node dysfunction ? Iatrogenic 01/22/2014  . Coronary artery disease    s/p CABG  . Dyslipidemia   . 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 Myocardial infarction   . PVD (peripheral vascular disease) ([place])     Past Surgical History:  Procedure Laterality Date  . ANGIOPLASTY    . CERVICAL FUSION    . CORONARY ARTERY BYPASS GRAFT    . HERNIA REPAIR    . IMPLANTABLE CARDIOVERTER DEFIBRILLATOR IMPLANT  10/15/13   MDT single chamber ICD implanted by Dr Caryl Comes for primary  prevention  . IMPLANTABLE CARDIOVERTER DEFIBRILLATOR IMPLANT N/A 10/15/2013   Procedure: IMPLANTABLE CARDIOVERTER DEFIBRILLATOR IMPLANT;  Surgeon: Deboraha Sprang, MD;  Location: [facility] CATH LAB;  Service: Cardiovascular;  Laterality: N/A;  . RIGHT HEART CATHETERIZATION N/A 09/20/2013   Procedure: RIGHT HEART CATH;  Surgeon: Jolaine Artist, MD;  Location: [facility] CATH LAB;  Service: Cardiovascular;  Laterality: N/A;    Prior to Admission medications   Medication Sig Start Date End Date Taking? Authorizing Provider  aspirin 81 MG tablet Take 81 mg by mouth daily.   Yes Historical Provider, MD  atorvastatin (LIPITOR) 80 MG tablet Take 1 tablet (80 mg total) by mouth daily. 08/27/15  Yes Jolaine Artist, MD  buPROPion [facility] SR) 150 MG 12 hr tablet Take 150 mg by mouth daily.   Yes Historical Provider, MD  digoxin (LANOXIN) 0.125 MG tablet Take 0.0625 mg by mouth every other day.   Yes Historical Provider, MD  escitalopram (LEXAPRO) 20 MG tablet Take 20 mg by mouth daily.  12/18/12  Yes Historical Provider, MD  ezetimibe (ZETIA) 10 MG tablet Take 1 tablet (10 mg total) by mouth daily. 02/13/16  Yes Jolaine Artist, MD  fenofibrate 160 MG tablet TAKE ONE TABLET BY MOUTH ONCE DAILY 10/27/16  Yes Jolaine Artist, MD  hydrALAZINE (APRESOLINE) 25 MG tablet Take 1 tablet (25 mg total) by mouth 3 (three) times daily. 05/26/16  Yes Deboraha Sprang, MD  HYDROmorphone (DILAUDID) 4 MG tablet Take 1 tablet (4 mg total) by mouth every 4 (four) hours as needed for severe pain. 10/26/16  Yes Owens Shark, NP  isosorbide mononitrate (IMDUR) 30 MG 24 hr tablet Take 2 tablets (60 mg total) by mouth 2 (two) times daily. 02/26/16  Yes Larey Dresser, MD  metoprolol succinate (TOPROL-XL) 50 MG 24 hr tablet Take 1 tablet (50 mg total) by mouth daily. 05/17/16  Yes Jolaine Artist, MD  Omega-3 Fatty Acids (FISH OIL) 1000 MG CAPS Take 1 capsule by mouth 2 (two) times daily.   Yes Historical Provider, MD  Vitamin D,  Ergocalciferol, (DRISDOL) 50000 UNITS CAPS Take 50,000 Units by mouth once a week. Every sunday 11/23/12  Yes Historical Provider, MD  HYDROcodone-acetaminophen (NORCO/VICODIN) 5-325 MG tablet Take 1 tablet by mouth every 4 (four) hours as needed for moderate pain. Patient not taking: Reported on 10/29/2016 10/15/16   Ladell Pier, MD    Scheduled Meds: . buPROPion  150 mg Oral Daily  . digoxin  0.0625 mg Oral QODAY  . escitalopram  20 mg Oral Daily  . ezetimibe  10 mg Oral Daily  . fenofibrate  160 mg Oral Daily  . hydrALAZINE  25 mg Oral TID  . metoprolol succinate  50 mg Oral Daily   Continuous Infusions: . sodium chloride 100 mL/hr at 10/30/16 0115   PRN Meds:.HYDROmorphone, ondansetron **OR** ondansetron (ZOFRAN) IV  Allergies as of 10/29/2016 - Review Complete 10/29/2016  Allergen Reaction Noted  . Tuberculin tests  10/29/2016    Family History  Problem Relation Age of Onset  . Kidney failure Father     Social History   Social History  . Marital status: Divorced    Spouse name: N/A  . Number of children: N/A  . Years of education: N/A   Occupational History  . [employer]   Social History Main Topics  . Smoking status: Former Smoker    Packs/day: 2.00    Years: 30.00    Types: Cigarettes    Quit date: 02/23/2006  . Smokeless tobacco: Never Used  . Alcohol use Yes     Comment: only rare social occasions  . Drug use: No  . Sexual activity: Not Currently   Other Topics Concern  . Not on file   Social History Narrative   Epworth Sleepiness Scale Score: 1         --I have HTN   --I seem to be losing my sex drive   --I feel stressed and lack motivation   --I wake up to urinate frequently at night   --I feel excessively sleepy and tired during the day   --I have had CHF   --I have COPD   --I have been told that I snore   --I awake feeling not rested    Review of Systems:  Review of Systems  Constitutional: Positive for  malaise/fatigue. Negative for chills and fever.  HENT: Negative for ear discharge and ear pain.   Eyes: Negative for photophobia, pain and discharge.  Respiratory: Negative for sputum production and shortness of breath.   Cardiovascular: Negative for chest pain and palpitations.  Gastrointestinal: Positive for abdominal pain and nausea. Negative for blood in stool and melena.  Genitourinary: Negative for dysuria and urgency.  Musculoskeletal: Positive for back pain and myalgias. Negative for falls.  Skin: Positive for itching.  Neurological: Negative for focal weakness and seizures.  Endo/Heme/Allergies: Does not bruise/bleed easily.  Psychiatric/Behavioral: Negative for hallucinations and suicidal ideas.    Physical Exam: Vital signs: Vitals:   10/30/16 0507 10/30/16 0951  BP: (!) 85/55 101/70  Pulse: 73  Resp: 18   Temp: 98 F (36.7 C)    Last BM Date: 10/29/16  Physical Exam  Constitutional: He is oriented to person, place, and time and well-developed, well-nourished, and in no distress. No distress.  HENT:  Head: Normocephalic and atraumatic.  Eyes: EOM are normal. Pupils are equal, round, and reactive to light. Scleral icterus is present.  Neck: Normal range of motion. Neck supple.  Cardiovascular: Normal rate and regular rhythm.   Pulmonary/Chest: Effort normal and breath sounds normal. No respiratory distress.  Abdominal: Soft. Bowel sounds are normal. There is tenderness. There is no rebound and no guarding.  Musculoskeletal: Normal range of motion. He exhibits no edema.  Neurological: He is alert and oriented to person, place, and time.  Skin: Skin is dry. No erythema.  Psychiatric: Affect and judgment normal.    GI:  Lab Results:  Recent Labs  10/30/16 0620  WBC 13.2*  HGB 12.3*  HCT 37.7*  PLT 164   BMET  Recent Labs  10/29/16 1320 10/30/16 0620  NA 133* 131*  K 4.7 4.0  CL  --  95*  CO2 23 24  GLUCOSE 165* 107*  BUN 22.1 28*  CREATININE  2.1* 2.07*  CALCIUM 11.6* 10.4*   LFT  Recent Labs  10/30/16 0620  PROT 6.8  ALBUMIN 2.6*  AST 136*  ALT 143*  ALKPHOS 399*  BILITOT 18.2*  BILIDIR 14.6*   PT/INR No results for input(s): LABPROT, INR in the last 72 hours.   Studies/Results: Ct Abdomen Wo Contrast  Result Date: 10/29/2016 CLINICAL DATA:  Hyperbilirubinemia. EXAM: CT ABDOMEN WITHOUT CONTRAST TECHNIQUE: Multidetector CT imaging of the abdomen was performed following the standard protocol without IV contrast. COMPARISON:  10/12/2016. FINDINGS: Lower chest: There are multiple pulmonary nodules identified within both lung bases. Compared with previous exam these are increased in number suspicious for metastatic disease. Hepatobiliary: The liver has an irregular contour compatible with cirrhosis. New intrahepatic bile duct dilatation. Mass within segment 5 of the liver measures 5.5 cm, image 34 of series 2. Previously 4.8 cm. There is diffuse gallbladder wall thickening. Pancreas: Exam detail is diminished due to lack of IV and oral contrast material. Mass in the region of the porta hepatis and head of pancreas is again noted. Difficult to assess due to limitations mentioned above. This measures approximately 4.3 x 4.5 cm, image 25 of series 2. Head, body and tail of pancreas appear normal. Spleen: Normal in size without focal abnormality. Adrenals/Urinary Tract: Similar appearance of right adrenal nodule measuring 1.6 cm, image 21 of series 2. Normal appearance of the left adrenal gland. Unremarkable appearance of the left kidney. The right kidney appears atrophic. Stomach/Bowel: Stomach is within normal limits. No pathologic dilatation of the visualized upper abdominal bowel loops. Vascular/Lymphatic: Aortic atherosclerosis. Infrarenal abdominal aortic aneurysm measures 3.5 cm, image 41 of series 2. Retroperitoneal adenopathy is again identified. Index periaortic node measures 1.8 cm, image 33 of series 2. Previously 1.4 cm. Lymph  node adjacent to the superior mesenteric artery measures 1.9 cm, image 27 of series 2. Previously 1.6 cm. Enlarged retrocaval node measures 1.5 cm, image 33 of series 2. Previously 1.7 cm. Other: No ascites.  No focal fluid collections. Musculoskeletal: No aggressive lytic or sclerotic bone lesions. IMPRESSION: 1. Interval development of intrahepatic bile duct dilatation. This likely reflects mass effect due to lesion in the region of the head of pancreas/porta hepatis. There is also wall thickening involving the gallbladder which may reflect obstruction of the cystic duct.  2. Right lobe of liver metastases is again noted and appears slightly increased in size from previous exam. 3. Metastatic adenopathy noted within the upper abdomen. 4. Aortic atherosclerosis 5. Infrarenal abdominal aortic aneurysm. Recommend followup by ultrasound in 2 years. This recommendation follows ACR consensus guidelines: White Paper of the ACR Incidental Findings Committee II on Vascular Findings. J Am Coll Radiol 2013; 10:789-794. Electronically Signed   By: Kerby Moors M.D.   On: 10/29/2016 13:55    Impression/Plan: - Obstructive jaundice with the multiple liver lesion as well as porta hepatis lymphadenopathy. EUS /FNA on 10/19/2016 showed adenocarcinoma favoring either cholangiocarcinoma versus metastatic disease. Repeat CT scan showed new intrahepatic biliary ductal dilatation - Worsening jaundice - H/O ischemic cardia myopathy. Status post AICD placement. - Chronic kidney disease  Recommendations ---------------------------- - Case discussed with Dr. Carol Ada who is on call for ERCP during this weekend. According to him ERCP would be very difficult and putting common bile duct stent may not help to relieve his jaundice because of multiple liver lesions - Dr. Benson Norway recommended interventional radiology consult.  Burnis Medin discuss with Dr. Paulita Fujita. - For now, I'll start patient on full liquid diet pending IR evaluation  and further discussion with Dr. Paulita Fujita -  Keep nothing by mouth past midnight - Monitor LFTs. GI will follow   LOS: 1 day   Otis Brace  MD, FACP 10/30/2016, 10:18 AM  Pager [phone] If no answer or after 5 PM call [phone]

## 2016-10-30 NOTE — Progress Notes (Addendum)
-   Case discussed with Dr. Paulita Fujita and Dr. Carol Ada. They both think that ERCP would be very difficult technically. - Recommended transfer to tertiary care center for advance biliary intervention. - Patient information has been provided to [facility] transfer center. Awaiting Call back

## 2016-10-30 NOTE — Progress Notes (Addendum)
PROGRESS NOTE                                                                                                                                                                                                             Patient Demographics:    Logan Burton, is a 61 y.o. male, DOB - 04/09/1956, KAJ:[id number]  Admit date - 10/29/2016   Admitting Physician Verlee Monte, MD  Outpatient Primary MD for the patient is WEBB, Valla Leaver, MD  LOS - 1  Outpatient Specialists: Oncology Dr Ammie Dalton, GI Dr Paulita Fujita  No chief complaint on file.      Brief Narrative   61 y.o. 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 - Patient complains of abdominal pain, this is unclear if related to obstructive uropathy versus static malignancy. - GI input greatly appreciated,  ERCP would be very difficult, IR consulted for possible need for drain, GI is arranging to transfer to surgery care center for possible need for ERCP, patient has been accepted to [place], discussed with patient and his nephew Dr.Girgis(patient's permission), at this point patient wants to focus on palliative, and pain control, and is rather stay in [place], so transfer is being deferred. - Continue with clear liquid diet, INR evaluation pending, holding subcutaneous Lovenox/aspirin in case of intervention is required. - Palliative care consulted  Essential hypertension -  with soft blood pressure, will stop Imdur and hydralazine, decrease metoprolol dose by half.  Coronary artery disease - Denies any chest pain or shortness of breath, aspirin on hold, continue with metoprolol, statin is on hold   Chronic systolic CHF (congestive heart failure) (HCC) - Currently appears to be been stated, will hold hydralazine/Imdur given soft blood pressure, decrease metoprolol dose, continue with digoxin, no ACEI/ARB renal failure  Chronic renal insufficiency - Stable, monitor closely   Dyslipidemia - Hold statin due to abnormal LFTs and jaundice.  COPD GOLD 0 - Stable at this time. - Supplemental oxygen and bronchodilators as needed.  Depression - Continue Wellbutrin 150 mg by mouth daily. - Continue Lexapro 20 mg by mouth daily.  Addendum: Had a family meeting with sister, nephew, niece, Patient expressed his wishes for no aggressive measures, and main goal is for comfort, pain control, does not wish for any unnecessary procedures unless is for comfort/pain relief, Code status has been changed to DO NOT RESUSCITATE, palliative medicine to see tomorrow, social worker consulted for hospice  Code Status : DNR  Family Communication  : Discussed with sister at bedside, spoke with nephew Dr. Thressa Sheller via phone  Disposition Plan  : Pending further workup  Consults  :  GI, IR  Procedures  :  None  DVT Prophylaxis  :  SCDs   Lab Results  Component Value Date   PLT 164 10/30/2016    Antibiotics  :   Anti-infectives    None        Objective:   Vitals:   10/29/16 1802 10/29/16 2223 10/30/16 0507 10/30/16 0951  BP: 114/74 101/74 (!) 85/55 101/70  Pulse: 81 80 73   Resp: 18 17 18    Temp: 97.7 F (36.5 C) 98.1 F (36.7 C) 98 F (36.7 C)   TempSrc: Oral Oral Oral   SpO2: 98% 94% 93%   Weight:      Height:        Wt Readings from Last 3 Encounters:  10/29/16 82.1 kg (181 lb)  10/29/16 82.1 kg (180 lb 14.4 oz)  10/26/16 82.5 kg (181 lb 12.8 oz)     Intake/Output Summary (Last 24 hours) at 10/30/16 1324 Last data filed at 10/30/16 0900  Gross per 24 hour  Intake              120 ml  Output                0 ml  Net              120 ml     Physical Exam  Awake Alert, Oriented X 3, Icteric sclera  Symmetrical Chest wall movement, Good air movement bilaterally, CTAB RRR,No Gallops,Rubs or new Murmurs, No Parasternal Heave +ve B.Sounds, Abd Soft, the gastric tenderness, No rebound - guarding or rigidity. No Cyanosis, Clubbing or edema, No new Rash or bruise      Data Review:    CBC  Recent Labs Lab 10/26/16 1148 10/30/16 0620  WBC 15.0* 13.2*  HGB 16.0 12.3*  HCT 46.0 37.7*  PLT 207 164  MCV 82.4 84.0  MCH 28.7 27.4  MCHC 34.8 32.6  RDW 15.1* 16.0*  LYMPHSABS 1.3 1.2  MONOABS 1.3* 1.2*  EOSABS 0.3 0.5  BASOSABS 0.0 0.0    Chemistries   Recent Labs Lab 10/26/16 1148 10/29/16 1320 10/30/16 0620  NA 135* 133* 131*  K 4.0 4.7 4.0  CL  --   --  95*  CO2 21* 23 24  GLUCOSE 142* 165* 107*  BUN 21.3 22.1 28*  CREATININE 1.9* 2.1* 2.07*  CALCIUM 11.5* 11.6* 10.4*  AST 184* 159* 136*  ALT 253* 181* 143*  ALKPHOS 433* 585* 399*  BILITOT 11.83* 18.15* 18.2*   ------------------------------------------------------------------------------------------------------------------ No results for input(s): CHOL, HDL, LDLCALC, TRIG, CHOLHDL,  LDLDIRECT in the last 72 hours.  No results found for: HGBA1C ------------------------------------------------------------------------------------------------------------------ No results for input(s): TSH, T4TOTAL, T3FREE, THYROIDAB in the last 72 hours.  Invalid input(s): FREET3 ------------------------------------------------------------------------------------------------------------------ No results for input(s): VITAMINB12, FOLATE, FERRITIN, TIBC, IRON, RETICCTPCT in the last 72 hours.  Coagulation profile No results for input(s): INR, PROTIME in the last 168 hours.  No results for input(s): DDIMER in the last 72 hours.  Cardiac Enzymes No results for input(s): CKMB, TROPONINI, MYOGLOBIN in the last 168 hours.  Invalid input(s): CK ------------------------------------------------------------------------------------------------------------------    Component Value Date/Time   BNP 92.3 06/17/2016 1558    Inpatient Medications  Scheduled Meds: . buPROPion  150 mg Oral Daily  . digoxin  0.0625 mg Oral QODAY  . escitalopram  20 mg Oral Daily  . ezetimibe  10 mg Oral Daily  . fenofibrate  160 mg Oral Daily  . hydrALAZINE  25 mg Oral TID  . metoprolol succinate  50 mg Oral Daily   Continuous Infusions: . sodium chloride 100 mL/hr at 10/30/16 0115   PRN Meds:.HYDROmorphone, ondansetron **OR** ondansetron (ZOFRAN) IV  Micro Results Recent Results (from the past 240 hour(s))  MRSA PCR Screening     Status: None   Collection Time: 10/29/16 11:24 PM  Result Value Ref Range Status   MRSA by PCR NEGATIVE NEGATIVE Final    Comment:        The GeneXpert MRSA Assay (FDA approved for NASAL specimens only), is one component of a comprehensive MRSA colonization surveillance program. It is not intended to diagnose MRSA infection nor to guide or monitor treatment for MRSA infections.     Radiology Reports Ct Abdomen Pelvis Wo Contrast  Result Date: 10/12/2016 CLINICAL  DATA:  Left lower quadrant abdominal pain for 2 weeks. EXAM: CT ABDOMEN AND PELVIS WITHOUT CONTRAST TECHNIQUE: Multidetector CT imaging of the abdomen and pelvis was performed following the standard protocol without IV contrast. COMPARISON:  None. FINDINGS: Lower chest: Multiple small nodules are noted in the visualized lung bases, with the largest measuring 6 mm. Hepatobiliary: Gallbladder appears to be contracted, without evidence of gallstones. Nearly 3 cm ill-defined low density is noted inferiorly in right hepatic lobe concerning for possible metastatic lesion. Pancreas: There appears to be pancreatic head enlargement concerning for pancreatic neoplasm or malignancy, although pancreatitis cannot be excluded. Spleen: Normal in size without focal abnormality. Adrenals/Urinary Tract: 2 cm right adrenal nodule is noted. Severe right renal atrophy is noted. Left kidney and ureter are unremarkable. Urinary bladder is decompressed. Stomach/Bowel: There is no evidence of bowel obstruction. Vascular/Lymphatic: Atherosclerosis of abdominal aorta is noted with 5.6 cm aneurysm noted in suprarenal abdominal aorta. Extensive adenopathy is noted in the superior retroperitoneal region as well as probable porta hepatis region concerning for metastatic disease. Reproductive: Mild prostatic enlargement is noted. Other: Mild inflammatory stranding is noted in mesenteric region. Musculoskeletal: No acute or significant osseous findings. IMPRESSION: Pancreatic head enlargement is noted concerning for pancreatic neoplasm or malignancy, although pancreatitis cannot be excluded. Further evaluation with MRI is recommended. Retroperitoneal and porta hepatis adenopathy is noted concerning for metastatic disease. 3 cm low density is noted in right hepatic lobe concerning for possible  metastatic lesion. Further evaluation with MRI is recommended. 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 Vascular surgery consultation  recommended due to increased risk of rupture for AAA >5.5 cm. This recommendation follows ACR consensus guidelines: White Paper of the ACR Incidental Findings Committee II on Vascular Findings. J Am Coll Radiol 2013; 10:789-794. Multiple pulmonary nodules are noted in visualized lung bases concerning for possible metastatic disease given the above findings. These results will be called to the ordering clinician or representative by the Radiologist Assistant, and communication documented in the PACS or zVision Dashboard. Electronically Signed   By: Marijo Conception, M.D.   On: 10/12/2016 15:30   Ct Abdomen Wo Contrast  Result Date: 10/29/2016 CLINICAL DATA:  Hyperbilirubinemia. EXAM: CT ABDOMEN WITHOUT CONTRAST TECHNIQUE: Multidetector CT imaging of the abdomen was performed following the standard protocol without IV contrast. COMPARISON:  10/12/2016. FINDINGS: Lower chest: There are multiple pulmonary nodules identified within both lung bases. Compared with previous exam these are increased in number suspicious for metastatic disease. Hepatobiliary: The liver has an irregular contour compatible with cirrhosis. New intrahepatic bile duct dilatation. Mass within segment 5 of the liver measures 5.5 cm, image 34 of series 2. Previously 4.8 cm. There is diffuse gallbladder wall thickening. Pancreas: Exam detail is diminished due to lack of IV and oral contrast material. Mass in the region of the porta hepatis and head of pancreas is again noted. Difficult to assess due to limitations mentioned above. This measures approximately 4.3 x 4.5 cm, image 25 of series 2. Head, body and tail of pancreas appear normal. Spleen: Normal in size without focal abnormality. Adrenals/Urinary Tract: Similar appearance of right adrenal nodule measuring 1.6 cm, image 21 of series 2. Normal appearance of the left adrenal gland. Unremarkable appearance of the left kidney. The right kidney appears atrophic. Stomach/Bowel: Stomach is within  normal limits. No pathologic dilatation of the visualized upper abdominal bowel loops. Vascular/Lymphatic: Aortic atherosclerosis. Infrarenal abdominal aortic aneurysm measures 3.5 cm, image 41 of series 2. Retroperitoneal adenopathy is again identified. Index periaortic node measures 1.8 cm, image 33 of series 2. Previously 1.4 cm. Lymph node adjacent to the superior mesenteric artery measures 1.9 cm, image 27 of series 2. Previously 1.6 cm. Enlarged retrocaval node measures 1.5 cm, image 33 of series 2. Previously 1.7 cm. Other: No ascites.  No focal fluid collections. Musculoskeletal: No aggressive lytic or sclerotic bone lesions. IMPRESSION: 1. Interval development of intrahepatic bile duct dilatation. This likely reflects mass effect due to lesion in the region of the head of pancreas/porta hepatis. There is also wall thickening involving the gallbladder which may reflect obstruction of the cystic duct. 2. Right lobe of liver metastases is again noted and appears slightly increased in size from previous exam. 3. Metastatic adenopathy noted within the upper abdomen. 4. Aortic atherosclerosis 5. Infrarenal abdominal aortic aneurysm. Recommend followup by ultrasound in 2 years. This recommendation follows ACR consensus guidelines: White Paper of the ACR Incidental Findings Committee II on Vascular Findings. J Am Coll Radiol 2013; 10:789-794. Electronically Signed   By: Kerby Moors M.D.   On: 10/29/2016 13:55   US Abdomen Complete  Result Date: 10/27/2016 CLINICAL DATA:  Known hepatic lesion and likely pancreatic enlargement with jaundice EXAM: ABDOMEN ULTRASOUND COMPLETE COMPARISON:  10/12/2016 FINDINGS: Gallbladder: Echogenic material is noted within although no definitive gallstones are seen. This likely represents tumefactive sludge. Mild gallbladder wall thickening is noted similar to that seen on prior CT examination Common bile duct: Diameter:  5.9 mm. Liver: Multiple hypoechoic lesions are identified  throughout the liver. Largest of these corresponds to that seen on recent CT examination measuring approximately 5.8 cm in greatest length. The multiplicity of lesions with was not well appreciated on prior CT due to the lack of IV contrast. IVC: Not well visualized Pancreas: Not well visualized Spleen: Size and appearance within normal limits. Right Kidney: Length: 5.9 cm. Diffuse atrophy is noted similar to that seen on recent CT examination. Left Kidney: Length: 14.4 cm. Echogenicity within normal limits. No mass or hydronephrosis visualized. Abdominal aorta: No aneurysm visualized. Other findings: None. IMPRESSION: Multiple hepatic lesions consistent with metastatic disease. The overall appearance is worse than that seen on the prior exam although the degree of visualization was limited on the prior study due to the lack of IV contrast. Tissue sampling is recommended for further evaluation. Electronically Signed   By: Inez Catalina M.D.   On: 10/27/2016 09:45     Rhilynn Preyer M.D on 10/30/2016 at 1:24 PM  Between 7am to 7pm - Pager - [phone]  After 7pm go to [email or web address] - password [facility]  [facility] -  Office  [phone]

## 2016-10-31 DIAGNOSIS — Z515 Encounter for palliative care: Secondary | ICD-10-CM

## 2016-10-31 DIAGNOSIS — Z7189 Other specified counseling: Secondary | ICD-10-CM

## 2016-10-31 DIAGNOSIS — J449 Chronic obstructive pulmonary disease, unspecified: Secondary | ICD-10-CM

## 2016-10-31 DIAGNOSIS — I2510 Atherosclerotic heart disease of native coronary artery without angina pectoris: Secondary | ICD-10-CM

## 2016-10-31 DIAGNOSIS — R17 Unspecified jaundice: Secondary | ICD-10-CM

## 2016-10-31 LAB — CBC
HEMATOCRIT: 37.5 % — AB (ref 39.0–52.0)
Hemoglobin: 12.6 g/dL — ABNORMAL LOW (ref 13.0–17.0)
MCH: 28.1 pg (ref 26.0–34.0)
MCHC: 33.6 g/dL (ref 30.0–36.0)
MCV: 83.7 fL (ref 78.0–100.0)
Platelets: 160 10*3/uL (ref 150–400)
RBC: 4.48 MIL/uL (ref 4.22–5.81)
RDW: 16.3 % — AB (ref 11.5–15.5)
WBC: 14.4 10*3/uL — AB (ref 4.0–10.5)

## 2016-10-31 LAB — COMPREHENSIVE METABOLIC PANEL
ALT: 121 U/L — ABNORMAL HIGH (ref 17–63)
AST: 118 U/L — AB (ref 15–41)
Albumin: 2.3 g/dL — ABNORMAL LOW (ref 3.5–5.0)
Alkaline Phosphatase: 431 U/L — ABNORMAL HIGH (ref 38–126)
Anion gap: 9 (ref 5–15)
BILIRUBIN TOTAL: 17.9 mg/dL — AB (ref 0.3–1.2)
BUN: 23 mg/dL — ABNORMAL HIGH (ref 6–20)
CALCIUM: 10.9 mg/dL — AB (ref 8.9–10.3)
CHLORIDE: 98 mmol/L — AB (ref 101–111)
CO2: 23 mmol/L (ref 22–32)
Creatinine, Ser: 1.55 mg/dL — ABNORMAL HIGH (ref 0.61–1.24)
GFR, EST AFRICAN AMERICAN: 54 mL/min — AB (ref >60)
GFR, EST NON AFRICAN AMERICAN: 46 mL/min — AB (ref >60)
Glucose, Bld: 128 mg/dL — ABNORMAL HIGH (ref 65–99)
POTASSIUM: 4.1 mmol/L (ref 3.5–5.1)
Sodium: 130 mmol/L — ABNORMAL LOW (ref 135–145)
Total Protein: 6.2 g/dL — ABNORMAL LOW (ref 6.5–8.1)

## 2016-10-31 LAB — PROTIME-INR
INR: 1.33
Prothrombin Time: 16.6 seconds — ABNORMAL HIGH (ref 11.4–15.2)

## 2016-10-31 MED ORDER — ONDANSETRON 4 MG PO TBDP: 4.0000 mg | ORAL_TABLET | Freq: Three times a day (TID) | ORAL | Status: DC | PRN

## 2016-10-31 MED ORDER — GLYCOPYRROLATE 1 MG PO TABS
1.0000 mg | ORAL_TABLET | ORAL | Status: DC | PRN
  Filled 2016-10-31: qty 1

## 2016-10-31 MED ORDER — SENNOSIDES-DOCUSATE SODIUM 8.6-50 MG PO TABS: 2.0000 | ORAL_TABLET | Freq: Two times a day (BID) | ORAL | 0 refills | Status: DC

## 2016-10-31 MED ORDER — GLYCOPYRROLATE 1 MG PO TABS: 1.0000 mg | ORAL_TABLET | ORAL | 0 refills | Status: AC | PRN

## 2016-10-31 MED ORDER — DEXAMETHASONE 0.5 MG PO TABS
1.0000 mg | ORAL_TABLET | Freq: Two times a day (BID) | ORAL | Status: DC
  Administered 2016-10-31: 1 mg via ORAL
  Filled 2016-10-31: qty 2

## 2016-10-31 MED ORDER — GLYCOPYRROLATE 0.2 MG/ML IJ SOLN
0.2000 mg | INTRAMUSCULAR | Status: DC | PRN
  Filled 2016-10-31: qty 1

## 2016-10-31 MED ORDER — METOPROLOL SUCCINATE ER 25 MG PO TB24: 25.0000 mg | ORAL_TABLET | Freq: Every day | ORAL | 0 refills | Status: DC

## 2016-10-31 MED ORDER — HYDROMORPHONE HCL 1 MG/ML IJ SOLN
1.0000 mg | INTRAMUSCULAR | Status: DC | PRN
  Administered 2016-10-31: 1 mg via INTRAVENOUS

## 2016-10-31 MED ORDER — OXYCODONE HCL 5 MG PO TABS: 5.0000 mg | ORAL_TABLET | ORAL | 0 refills | Status: DC | PRN

## 2016-10-31 MED ORDER — DEXAMETHASONE 1 MG PO TABS: 1.0000 mg | ORAL_TABLET | Freq: Two times a day (BID) | ORAL | 0 refills | Status: DC

## 2016-10-31 MED ORDER — OXYCODONE HCL ER 15 MG PO T12A: 15.0000 mg | EXTENDED_RELEASE_TABLET | Freq: Two times a day (BID) | ORAL | 0 refills | Status: DC

## 2016-10-31 MED ORDER — ONDANSETRON 4 MG PO TBDP: 4.0000 mg | ORAL_TABLET | Freq: Three times a day (TID) | ORAL | 0 refills | Status: DC | PRN

## 2016-10-31 MED ORDER — HYDROMORPHONE HCL 1 MG/ML IJ SOLN
INTRAMUSCULAR | Status: AC
  Filled 2016-10-31: qty 1

## 2016-10-31 MED ORDER — POLYVINYL ALCOHOL 1.4 % OP SOLN
1.0000 [drp] | Freq: Four times a day (QID) | OPHTHALMIC | Status: DC | PRN
  Administered 2016-10-31: 1 [drp] via OPHTHALMIC
  Filled 2016-10-31: qty 15

## 2016-10-31 MED ORDER — OXYCODONE HCL 5 MG PO TABS: 5.0000 mg | ORAL_TABLET | ORAL | Status: DC | PRN

## 2016-10-31 MED ORDER — BIOTENE DRY MOUTH MT LIQD: 15.0000 mL | OROMUCOSAL | Status: DC | PRN

## 2016-10-31 MED ORDER — POLYETHYLENE GLYCOL 3350 17 G PO PACK: 17.0000 g | PACK | Freq: Every day | ORAL | 2 refills | Status: DC | PRN

## 2016-10-31 NOTE — Progress Notes (Signed)
Writer received return call from [employer]. She will try to be here this evening around 5 to 6 p.m.

## 2016-10-31 NOTE — Progress Notes (Signed)
[facility])  Received request from [facility] for family interest in home hospice services. Chart and patient information currently under review to confirm hospice eligibility.   Met with patient, his sister [name] and his niece at bedside to explain services and answer questions. They are familiar with hospice services. They report plan to go home this evening after defibrillator is deactivated. They are requesting Dr. Maurice Small as hospice attending which will need to be confirmed. They declined offer for DME at this time and prefer to order as patient's needs change. Provided family with [facility] contact information. They are aware a referral center specialist will contact them tomorrow to schedule the admission visit.   Please send signed completed out of facility DNR home with patient.  Please send scripts for any medications patient does not already have including comfort medications.  Please fax discharge summary to [phone].  Thank you for the opportunity to serve this family.   Please do not hesitate to call with questions.   Thank you,  Erling Conte, LCSW [phone]

## 2016-10-31 NOTE — Consult Note (Signed)
Chief Complaint: Patient was seen in consultation today for jaundice  Referring Physician(s):  Dr. Alessandra Bevels  Supervising Physician: Jacqulynn Cadet  Patient Status: [facility] - In-pt  History of Present Illness: Logan Burton is a 61 y.o. 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  Patient admitted to [facility] for decompression of his hyperbilirubinemia due to bile duct obstruction from adenopathy. At the time of admission, patient was considering aggressive evaluation and management of his disease with possible transfer to tertiary care center.    IR consulted for biliary drain placement at the request of Dr. Alessandra Bevels. Dr. Laurence Ferrari has reviewed case and participated in a team-based approach to ensuring best care for this patient.  Patient is currently on full liquids diet.  He denies significant nausea or itching.    Past Medical History:  Diagnosis Date  . CHF (congestive heart failure) ([place])   . Chronic renal insufficiency   . Chronotropic incompetence with sinus node dysfunction ? Iatrogenic 01/22/2014  . Coronary artery disease    s/p CABG  . Dyslipidemia   . 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 Myocardial infarction   . PVD (peripheral vascular disease) ([place])     Past Surgical History:  Procedure Laterality Date  . ANGIOPLASTY    . CERVICAL FUSION    . CORONARY ARTERY BYPASS GRAFT    . HERNIA REPAIR    . IMPLANTABLE CARDIOVERTER DEFIBRILLATOR IMPLANT  10/15/13   MDT single chamber ICD implanted by Dr Caryl Comes for primary prevention  . IMPLANTABLE CARDIOVERTER DEFIBRILLATOR IMPLANT N/A 10/15/2013   Procedure: IMPLANTABLE CARDIOVERTER DEFIBRILLATOR IMPLANT;  Surgeon: Deboraha Sprang, MD;  Location: [facility] CATH LAB;   Service: Cardiovascular;  Laterality: N/A;  . RIGHT HEART CATHETERIZATION N/A 09/20/2013   Procedure: RIGHT HEART CATH;  Surgeon: Jolaine Artist, MD;  Location: [facility] CATH LAB;  Service: Cardiovascular;  Laterality: N/A;    Allergies: Tuberculin tests  Medications: Prior to Admission medications   Medication Sig Start Date End Date Taking? Authorizing Provider  aspirin 81 MG tablet Take 81 mg by mouth daily.   Yes Historical Provider, MD  atorvastatin (LIPITOR) 80 MG tablet Take 1 tablet (80 mg total) by mouth daily. 08/27/15  Yes Jolaine Artist, MD  buPROPion [facility] SR) 150 MG 12 hr tablet Take 150 mg by mouth daily.   Yes Historical Provider, MD  digoxin (LANOXIN) 0.125 MG tablet Take 0.0625 mg by mouth every other day.   Yes Historical Provider, MD  escitalopram (LEXAPRO) 20 MG tablet Take 20 mg by mouth daily.  12/18/12  Yes Historical Provider, MD  ezetimibe (ZETIA) 10 MG tablet Take 1 tablet (10 mg total) by mouth daily. 02/13/16  Yes Jolaine Artist, MD  fenofibrate 160 MG tablet TAKE ONE TABLET BY MOUTH ONCE DAILY 10/27/16  Yes Jolaine Artist, MD  hydrALAZINE (APRESOLINE) 25 MG tablet Take 1 tablet (25 mg total) by mouth 3 (three) times daily. 05/26/16  Yes Deboraha Sprang, MD  HYDROmorphone (DILAUDID) 4 MG tablet Take 1 tablet (4 mg total) by mouth every 4 (four) hours as needed for severe pain. 10/26/16  Yes Owens Shark, NP  isosorbide mononitrate (IMDUR) 30 MG 24 hr tablet Take 2 tablets (60 mg total) by mouth 2 (two) times daily. 02/26/16  Yes Larey Dresser,  MD  metoprolol succinate (TOPROL-XL) 50 MG 24 hr tablet Take 1 tablet (50 mg total) by mouth daily. 05/17/16  Yes Jolaine Artist, MD  Omega-3 Fatty Acids (FISH OIL) 1000 MG CAPS Take 1 capsule by mouth 2 (two) times daily.   Yes Historical Provider, MD  Vitamin D, Ergocalciferol, (DRISDOL) 50000 UNITS CAPS Take 50,000 Units by mouth once a week. Every sunday 11/23/12  Yes Historical Provider, MD    HYDROcodone-acetaminophen (NORCO/VICODIN) 5-325 MG tablet Take 1 tablet by mouth every 4 (four) hours as needed for moderate pain. Patient not taking: Reported on 10/29/2016 10/15/16   Ladell Pier, MD     Family History  Problem Relation Age of Onset  . Kidney failure Father     Social History   Social History  . Marital status: Divorced    Spouse name: N/A  . Number of children: N/A  . Years of education: N/A   Occupational History  . [employer]   Social History Main Topics  . Smoking status: Former Smoker    Packs/day: 2.00    Years: 30.00    Types: Cigarettes    Quit date: 02/23/2006  . Smokeless tobacco: Never Used  . Alcohol use Yes     Comment: only rare social occasions  . Drug use: No  . Sexual activity: Not Currently   Other Topics Concern  . None   Social History Narrative   Epworth Sleepiness Scale Score: 1         --I have HTN   --I seem to be losing my sex drive   --I feel stressed and lack motivation   --I wake up to urinate frequently at night   --I feel excessively sleepy and tired during the day   --I have had CHF   --I have COPD   --I have been told that I snore   --I awake feeling not rested    Review of Systems  Constitutional: Positive for fatigue.  Respiratory: Negative for cough and shortness of breath.   Cardiovascular: Negative for chest pain.  Gastrointestinal: Positive for abdominal pain.  Psychiatric/Behavioral: Negative for behavioral problems and confusion.    Vital Signs: BP 101/63 (BP Location: Right Arm)   Pulse 87   Temp 98 F (36.7 C) (Tympanic)   Resp 18   Ht 6' (1.829 m)   Wt 181 lb (82.1 kg)   SpO2 97%   BMI 24.55 kg/m   Physical Exam  Constitutional: He is oriented to person, place, and time. He appears well-developed.  Cardiovascular: Normal rate, regular rhythm and normal heart sounds.   Pulmonary/Chest: Effort normal and breath sounds normal. No respiratory distress.   Abdominal: Soft. There is tenderness (diffuse, generalized).  Neurological: He is alert and oriented to person, place, and time.  Skin: Skin is warm and dry.  Psychiatric: He has a normal mood and affect. His behavior is normal. Judgment and thought content normal.  Nursing note and vitals reviewed.    Imaging: Ct Abdomen Pelvis Wo Contrast  Result Date: 10/12/2016 CLINICAL DATA:  Left lower quadrant abdominal pain for 2 weeks. EXAM: CT ABDOMEN AND PELVIS WITHOUT CONTRAST TECHNIQUE: Multidetector CT imaging of the abdomen and pelvis was performed following the standard protocol without IV contrast. COMPARISON:  None. FINDINGS: Lower chest: Multiple small nodules are noted in the visualized lung bases, with the largest measuring 6 mm. Hepatobiliary: Gallbladder appears to be contracted, without evidence of gallstones. Nearly 3 cm ill-defined low density is  noted inferiorly in right hepatic lobe concerning for possible metastatic lesion. Pancreas: There appears to be pancreatic head enlargement concerning for pancreatic neoplasm or malignancy, although pancreatitis cannot be excluded. Spleen: Normal in size without focal abnormality. Adrenals/Urinary Tract: 2 cm right adrenal nodule is noted. Severe right renal atrophy is noted. Left kidney and ureter are unremarkable. Urinary bladder is decompressed. Stomach/Bowel: There is no evidence of bowel obstruction. Vascular/Lymphatic: Atherosclerosis of abdominal aorta is noted with 5.6 cm aneurysm noted in suprarenal abdominal aorta. Extensive adenopathy is noted in the superior retroperitoneal region as well as probable porta hepatis region concerning for metastatic disease. Reproductive: Mild prostatic enlargement is noted. Other: Mild inflammatory stranding is noted in mesenteric region. Musculoskeletal: No acute or significant osseous findings. IMPRESSION: Pancreatic head enlargement is noted concerning for pancreatic neoplasm or malignancy, although  pancreatitis cannot be excluded. Further evaluation with MRI is recommended. Retroperitoneal and porta hepatis adenopathy is noted concerning for metastatic disease. 3 cm low density is noted in right hepatic lobe concerning for possible metastatic lesion. Further evaluation with MRI is recommended. 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 Vascular surgery consultation recommended due to increased risk of rupture for AAA >5.5 cm. This recommendation follows ACR consensus guidelines: White Paper of the ACR Incidental Findings Committee II on Vascular Findings. J Am Coll Radiol 2013; 10:789-794. Multiple pulmonary nodules are noted in visualized lung bases concerning for possible metastatic disease given the above findings. These results will be called to the ordering clinician or representative by the Radiologist Assistant, and communication documented in the PACS or zVision Dashboard. Electronically Signed   By: Marijo Conception, M.D.   On: 10/12/2016 15:30   Ct Abdomen Wo Contrast  Result Date: 10/29/2016 CLINICAL DATA:  Hyperbilirubinemia. EXAM: CT ABDOMEN WITHOUT CONTRAST TECHNIQUE: Multidetector CT imaging of the abdomen was performed following the standard protocol without IV contrast. COMPARISON:  10/12/2016. FINDINGS: Lower chest: There are multiple pulmonary nodules identified within both lung bases. Compared with previous exam these are increased in number suspicious for metastatic disease. Hepatobiliary: The liver has an irregular contour compatible with cirrhosis. New intrahepatic bile duct dilatation. Mass within segment 5 of the liver measures 5.5 cm, image 34 of series 2. Previously 4.8 cm. There is diffuse gallbladder wall thickening. Pancreas: Exam detail is diminished due to lack of IV and oral contrast material. Mass in the region of the porta hepatis and head of pancreas is again noted. Difficult to assess due to limitations mentioned above. This measures approximately 4.3 x  4.5 cm, image 25 of series 2. Head, body and tail of pancreas appear normal. Spleen: Normal in size without focal abnormality. Adrenals/Urinary Tract: Similar appearance of right adrenal nodule measuring 1.6 cm, image 21 of series 2. Normal appearance of the left adrenal gland. Unremarkable appearance of the left kidney. The right kidney appears atrophic. Stomach/Bowel: Stomach is within normal limits. No pathologic dilatation of the visualized upper abdominal bowel loops. Vascular/Lymphatic: Aortic atherosclerosis. Infrarenal abdominal aortic aneurysm measures 3.5 cm, image 41 of series 2. Retroperitoneal adenopathy is again identified. Index periaortic node measures 1.8 cm, image 33 of series 2. Previously 1.4 cm. Lymph node adjacent to the superior mesenteric artery measures 1.9 cm, image 27 of series 2. Previously 1.6 cm. Enlarged retrocaval node measures 1.5 cm, image 33 of series 2. Previously 1.7 cm. Other: No ascites.  No focal fluid collections. Musculoskeletal: No aggressive lytic or sclerotic bone lesions. IMPRESSION: 1. Interval development of intrahepatic bile duct dilatation. This  likely reflects mass effect due to lesion in the region of the head of pancreas/porta hepatis. There is also wall thickening involving the gallbladder which may reflect obstruction of the cystic duct. 2. Right lobe of liver metastases is again noted and appears slightly increased in size from previous exam. 3. Metastatic adenopathy noted within the upper abdomen. 4. Aortic atherosclerosis 5. Infrarenal abdominal aortic aneurysm. Recommend followup by ultrasound in 2 years. This recommendation follows ACR consensus guidelines: White Paper of the ACR Incidental Findings Committee II on Vascular Findings. J Am Coll Radiol 2013; 10:789-794. Electronically Signed   By: Kerby Moors M.D.   On: 10/29/2016 13:55   US Abdomen Complete  Result Date: 10/27/2016 CLINICAL DATA:  Known hepatic lesion and likely pancreatic enlargement  with jaundice EXAM: ABDOMEN ULTRASOUND COMPLETE COMPARISON:  10/12/2016 FINDINGS: Gallbladder: Echogenic material is noted within although no definitive gallstones are seen. This likely represents tumefactive sludge. Mild gallbladder wall thickening is noted similar to that seen on prior CT examination Common bile duct: Diameter: 5.9 mm. Liver: Multiple hypoechoic lesions are identified throughout the liver. Largest of these corresponds to that seen on recent CT examination measuring approximately 5.8 cm in greatest length. The multiplicity of lesions with was not well appreciated on prior CT due to the lack of IV contrast. IVC: Not well visualized Pancreas: Not well visualized Spleen: Size and appearance within normal limits. Right Kidney: Length: 5.9 cm. Diffuse atrophy is noted similar to that seen on recent CT examination. Left Kidney: Length: 14.4 cm. Echogenicity within normal limits. No mass or hydronephrosis visualized. Abdominal aorta: No aneurysm visualized. Other findings: None. IMPRESSION: Multiple hepatic lesions consistent with metastatic disease. The overall appearance is worse than that seen on the prior exam although the degree of visualization was limited on the prior study due to the lack of IV contrast. Tissue sampling is recommended for further evaluation. Electronically Signed   By: Inez Catalina M.D.   On: 10/27/2016 09:45    Labs:  CBC:  Recent Labs  06/17/16 1558 10/26/16 1148 10/30/16 0620 10/31/16 0548  WBC 10.2 15.0* 13.2* 14.4*  HGB 14.6 16.0 12.3* 12.6*  HCT 44.3 46.0 37.7* 37.5*  PLT 230 207 164 160    COAGS:  Recent Labs  10/30/16 1319 10/31/16 0548  INR 1.23 1.33    BMP:  Recent Labs  06/17/16 1558  10/26/16 1148 10/29/16 1320 10/30/16 0620 10/31/16 0548  NA 139  < > 135* 133* 131* 130*  K 4.5  < > 4.0 4.7 4.0 4.1  CL 108  --   --   --  95* 98*  CO2 25  < > 21* _0 GLUCOSE 104*  < > 142* 165* 107* 128*  BUN 12  < > 21.3 22.1 28* 23*    CALCIUM 9.6  < > 11.5* 11.6* 10.4* 10.9*  CREATININE 1.95*  < > 1.9* 2.1* 2.07* 1.55*  GFRNONAA 35*  --   --   --  33* 46*  GFRAA 41*  --   --   --  38* 54*  < > = values in this interval not displayed.  LIVER FUNCTION TESTS:  Recent Labs  10/26/16 1148 10/29/16 1320 10/30/16 0620 10/31/16 0548  BILITOT 11.83* 18.15* 18.2* 17.9*  AST 184* 159* 136* 118*  ALT 253* 181* 143* 121*  ALKPHOS 433* 585* 399* 431*  PROT 8.4* 7.5 6.8 6.2*  ALBUMIN 3.2* 2.7* 2.6* 2.3*    TUMOR MARKERS: No results for input(s): AFPTM,  CEA, CA199, CHROMGRNA in the last 8760 hours.  Assessment and Plan: Jaundice, multiple liver lesions, adenocarcinoma favoring either cholangiocarcinoma vs. Metastatic disease.  Discussed case and possible interventions with Dr. Laurence Ferrari and Dr. Alessandra Bevels. Biliary drain may be placed for patient if warranted to improve drainage and hyperbilirubinemia, however would not likely improve patient's pain which is his chief complaint at this time. PA met with patient to discuss possible interventions and expectations as well as symptom management. Since admission and through multiple conversations with his providers Logan Burton is leaning toward pursuing palliative care with no interventions at this time.  IR will continue to be available for any future needs.  Thank you for this interesting consult.  I greatly enjoyed meeting Logan Burton and look forward to participating in their care.  A copy of this report was sent to the requesting provider on this date.  Electronically Signed: Docia Barrier 10/31/2016, 8:32 AM   I spent a total of 40 Minutes   in face to face in clinical consultation, greater than 50% of which was counseling/coordinating care for jaundice, adenocarcinoma

## 2016-10-31 NOTE — Progress Notes (Signed)
[facility] Gastroenterology Progress Note  PEARSON PICOU 61 y.o. Jul 22, 1956  CC:  Jaundice   Subjective: Patient resting in the bed comfortably. Yesterday's events noted. Patient does not want any palliative chemotherapy or any additional treatment. He wants comfort care.     Objective: Vital signs in last 24 hours: Vitals:   10/30/16 2109 10/31/16 0519  BP: 109/76 101/63  Pulse: 81 87  Resp: 18 18  Temp: 98 F (36.7 C) 98 F (36.7 C)    Physical Exam:  General:  Alert, cooperative, no distress, appears stated age. Jaundice   Head:  Normocephalic, without obvious abnormality, atraumatic  Eyes:  Scleral icterus noted   Lungs:   Clear to auscultation bilaterally, respirations unlabored  Heart:  Regular rate and rhythm, S1, S2 normal  Abdomen:   Soft, Mild epigastric and right upper quadrant discomfort ,bowel sounds active all four quadrants,  no masses, no peritoneal signs   Extremities: Extremities normal, atraumatic, no  edema       Lab Results:  Recent Labs  10/30/16 0620 10/31/16 0548  NA 131* 130*  K 4.0 4.1  CL 95* 98*  CO2 24 23  GLUCOSE 107* 128*  BUN 28* 23*  CREATININE 2.07* 1.55*  CALCIUM 10.4* 10.9*    Recent Labs  10/30/16 0620 10/31/16 0548  AST 136* 118*  ALT 143* 121*  ALKPHOS 399* 431*  BILITOT 18.2* 17.9*  PROT 6.8 6.2*  ALBUMIN 2.6* 2.3*    Recent Labs  10/30/16 0620 10/31/16 0548  WBC 13.2* 14.4*  NEUTROABS 10.3*  --   HGB 12.3* 12.6*  HCT 37.7* 37.5*  MCV 84.0 83.7  PLT 164 160    Recent Labs  10/30/16 1319 10/31/16 0548  LABPROT 15.6* 16.6*  INR 1.23 1.33      Assessment/Plan: - Obstructive jaundice with the multiple liver lesion as well as porta hepatis lymphadenopathy. EUS /FNA on 10/19/2016 showed adenocarcinoma favoring either cholangiocarcinoma versus metastatic disease. Repeat CT scan showed new intrahepatic biliary ductal dilatation -  jaundice - H/O ischemic cardia myopathy. Status post AICD placement. -  Chronic kidney disease  Recommendations ---------------------------- - Case was discussed with Dr. Carol Ada and Dr. Paulita Fujita yesterday.  Patient was [facility] for advanced ERCP , but patient does not want any chemotherapy or any further management. - Long discussion with the family including nephew who is a physician. Patient wants comfort care only and no chemotherapy. - All the questions from family answered. - Approximately 25 minutes spent in the patient's care with more than 50% spent in face-to-face consultation and coordination of care. - Advance diet to regular. - GI will sign off. Call us back if needed   Otis Brace MD, [place] 10/31/2016, 11:43 AM  Pager [phone]  If no answer or after 5 PM call [phone]

## 2016-10-31 NOTE — Progress Notes (Signed)
Writer spoke with [employer] regarding turning "off" ICD. Will await return call from representative.

## 2016-10-31 NOTE — Consult Note (Signed)
Consultation Note Date: 10/31/2016   Patient Name: Logan Burton  DOB: August 04, 1956  MRN: [id number]  Age / Sex: 61 y.o., male  PCP: Maurice Small, MD Referring Physician: Albertine Patricia, MD  Reason for Consultation: Establishing goals of care, Hospice Evaluation and Pain control  HPI/Patient Profile: 61 y.o. male  with past medical history of HTN, CAD s/p CABG, CHF, s/p AICD HLD, PVD, atrophic R kidney, recently diagnosed with metastatic pancreatic cancer (liver, adrenals, abdominal lymphadenopathy) admitted on 10/29/2016 with obstructing jaundice for possible ERCP. Palliative medicine consulted for [employer] and pain management. Prior to this consult patient had decided with primary team to proceed with comfort measures and home with Hospice.    Clinical Assessment and Goals of Care: Met with patient, his sister and niece. Prior to this admission patient was living independently at his own home, able to cook for himself and complete all ADL's. Patient and family are clear in their goals of quality of life over quantity. They are aware that this cancer will be terminal for patient. Patient desires to be home and independent for as long as possible and will then move in with his sister when he needs more help. His sister expressed concern that patient will not ask for help when it is needed. We discussed that Hospice may be able to offer help in assessing this area and facilitating these conversations. I have concerns that with hepatic obstruction that patient will not last long in the home. Pain is controlled with current regimen. Patient c/o pressure in abdomen, decreased appetite, nausea at times, intermittent- recommend adding steroid as adjuvant and oral breakthrough in addition to current long acting.   Primary Decision Maker PATIENT    SUMMARY OF RECOMMENDATIONS -DNR - out of facility placed on chart -pt has  implanted cardiac defibrillator that will need to be deactivated- order placed -Pain- agree with oxycontin, will add oxy ir 62m q3hr prn for breakthrough; dexamethasone 155mbid (0800, 1400) as adjuvant to decrease gut inflammation, stimulate appetite and decrease fatigue -Continue bowel regimen -Care mgmt for home with Hospice- pt wishes to go home asap -ondansetron 38m18mDT q6hrs prn for nausea -robinul for secretions -discharging physician please write for dexamethasone, ondansetron, oxy IR, and robinul at discharge- thank you      Code Status/Advance Care Planning:  DNR    Symptom Management:   As above  Palliative Prophylaxis:   Bowel Regimen and Frequent Pain Assessment  Additional Recommendations (Limitations, Scope, Preferences):  Full Comfort Care  Prognosis:    < 3 months d/t very aggressive cancer with no further treatments planned- I have concerns that with hepatic obstruction it is likely much less  Discharge Planning: Home with Hospice  Primary Diagnoses: Present on Admission: . Obstructive jaundice . Chronic systolic CHF (congestive heart failure) ([place] COPD GOLD 0 . Dyslipidemia . Essential hypertension . Coronary artery disease . Depression . Chronic renal insufficiency   I have reviewed the medical record, interviewed the patient and family, and examined the patient. The following  aspects are pertinent.  Past Medical History:  Diagnosis Date  . CHF (congestive heart failure) ([place])   . Chronic renal insufficiency   . Chronotropic incompetence with sinus node dysfunction ? Iatrogenic 01/22/2014  . Coronary artery disease    s/p CABG  . Dyslipidemia   . 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 Myocardial infarction   . PVD (peripheral vascular disease) ([place])    Social History   Social History  . Marital status: Divorced    Spouse name: N/A  . Number of children: N/A  . Years of education: N/A   Occupational History   . [employer]   Social History Main Topics  . Smoking status: Former Smoker    Packs/day: 2.00    Years: 30.00    Types: Cigarettes    Quit date: 02/23/2006  . Smokeless tobacco: Never Used  . Alcohol use Yes     Comment: only rare social occasions  . Drug use: No  . Sexual activity: Not Currently   Other Topics Concern  . None   Social History Narrative   Epworth Sleepiness Scale Score: 1         --I have HTN   --I seem to be losing my sex drive   --I feel stressed and lack motivation   --I wake up to urinate frequently at night   --I feel excessively sleepy and tired during the day   --I have had CHF   --I have COPD   --I have been told that I snore   --I awake feeling not rested   Family History  Problem Relation Age of Onset  . Kidney failure Father    Scheduled Meds: . HYDROmorphone      . buPROPion  150 mg Oral Daily  . dexamethasone  1 mg Oral BID AC  . digoxin  0.0625 mg Oral QODAY  . escitalopram  20 mg Oral Daily  . ezetimibe  10 mg Oral Daily  . fenofibrate  160 mg Oral Daily  . metoprolol succinate  25 mg Oral Daily  . oxyCODONE  15 mg Oral Q12H  . senna-docusate  2 tablet Oral BID   Continuous Infusions: PRN Meds:.antiseptic oral rinse, glycopyrrolate **OR** glycopyrrolate **OR** glycopyrrolate, HYDROmorphone (DILAUDID) injection, ondansetron, polyethylene glycol, polyvinyl alcohol Medications Prior to Admission:  Prior to Admission medications   Medication Sig Start Date End Date Taking? Authorizing Provider  aspirin 81 MG tablet Take 81 mg by mouth daily.   Yes Historical Provider, MD  atorvastatin (LIPITOR) 80 MG tablet Take 1 tablet (80 mg total) by mouth daily. 08/27/15  Yes Jolaine Artist, MD  buPROPion [facility] SR) 150 MG 12 hr tablet Take 150 mg by mouth daily.   Yes Historical Provider, MD  digoxin (LANOXIN) 0.125 MG tablet Take 0.0625 mg by mouth every other day.   Yes Historical Provider, MD  escitalopram  (LEXAPRO) 20 MG tablet Take 20 mg by mouth daily.  12/18/12  Yes Historical Provider, MD  ezetimibe (ZETIA) 10 MG tablet Take 1 tablet (10 mg total) by mouth daily. 02/13/16  Yes Jolaine Artist, MD  fenofibrate 160 MG tablet TAKE ONE TABLET BY MOUTH ONCE DAILY 10/27/16  Yes Jolaine Artist, MD  hydrALAZINE (APRESOLINE) 25 MG tablet Take 1 tablet (25 mg total) by mouth 3 (three) times daily. 05/26/16  Yes Deboraha Sprang, MD  HYDROmorphone (DILAUDID) 4 MG tablet Take 1 tablet (4 mg total) by mouth every 4 (four) hours as needed for  severe pain. 10/26/16  Yes Owens Shark, NP  isosorbide mononitrate (IMDUR) 30 MG 24 hr tablet Take 2 tablets (60 mg total) by mouth 2 (two) times daily. 02/26/16  Yes Larey Dresser, MD  metoprolol succinate (TOPROL-XL) 50 MG 24 hr tablet Take 1 tablet (50 mg total) by mouth daily. 05/17/16  Yes Jolaine Artist, MD  Omega-3 Fatty Acids (FISH OIL) 1000 MG CAPS Take 1 capsule by mouth 2 (two) times daily.   Yes Historical Provider, MD  Vitamin D, Ergocalciferol, (DRISDOL) 50000 UNITS CAPS Take 50,000 Units by mouth once a week. Every _0 61.67 ml    LBM: Last BM Date: 10/30/16 Baseline Weight: Weight: 82.1 kg (181 lb) Most recent weight: Weight: 82.1 kg (181 lb)     Palliative Assessment/Data: PPS: 80%     Thank you for this consult. Palliative medicine will continue to follow and assist as needed.   Time Total: 90 minutes Greater than 50%  of this time was spent counseling and coordinating care related to the above assessment and plan.  Signed by: Mariana Kaufman, AGNP-C Palliative Medicine    Please contact Palliative Medicine Team phone at [phone] for questions and concerns.  For individual provider: See Shea Evans

## 2016-10-31 NOTE — Progress Notes (Signed)
Pt leaving this evening with his family. Alert, oriented, and without c/o. Discharge instructions/prescriptions given/explained with pt/family verbalizing understanding.  Hospice, palliative, [employer] has visited pt. Pt leaving with cell phone and charger, clothes, and glasses.

## 2016-10-31 NOTE — Progress Notes (Signed)
Nutrition Brief Note  Pt identified as at nutrition risk on the Malnutrition Screen Tool.  Chart reviewed. Pt now transitioning to comfort care.  No nutrition interventions warranted at this time.    Krystle Polcyn, MS, RD, LDN Pager: [phone] After Hours Pager: [phone]   

## 2016-10-31 NOTE — Discharge Summary (Signed)
Logan Burton, is a 61 y.o. male  DOB 1956-03-21  MRN [id number].  Admission date:  10/29/2016  Admitting Physician  Verlee Monte, MD  Discharge Date:  10/31/2016   Primary MD  Jonathon Bellows, MD  Recommendations for primary care physician for things to follow:  - Further management per hospice and palliative care of [facility].   Admission Diagnosis  biluary obstruction heart failure   Discharge Diagnosis  biluary obstruction heart failure    Principal Problem:   Obstructive jaundice Active Problems:   Essential hypertension   Coronary artery disease   Chronic systolic CHF (congestive heart failure) (HCC)   Chronic renal insufficiency   Dyslipidemia   COPD GOLD 0   Depression      Past Medical History:  Diagnosis Date  . CHF (congestive heart failure) ([place])   . Chronic renal insufficiency   . Chronotropic incompetence with sinus node dysfunction ? Iatrogenic 01/22/2014  . Coronary artery disease    s/p CABG  . Dyslipidemia   . 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 Myocardial infarction   . PVD (peripheral vascular disease) ([place])     Past Surgical History:  Procedure Laterality Date  . ANGIOPLASTY    . CERVICAL FUSION    . CORONARY ARTERY BYPASS GRAFT    . HERNIA REPAIR    . IMPLANTABLE CARDIOVERTER DEFIBRILLATOR IMPLANT  10/15/13   MDT single chamber ICD implanted by Dr Caryl Comes for primary prevention  . IMPLANTABLE CARDIOVERTER DEFIBRILLATOR IMPLANT N/A 10/15/2013   Procedure: IMPLANTABLE CARDIOVERTER DEFIBRILLATOR IMPLANT;  Surgeon: Deboraha Sprang, MD;  Location: [facility] CATH LAB;  Service: Cardiovascular;  Laterality: N/A;  . RIGHT HEART CATHETERIZATION N/A 09/20/2013   Procedure: RIGHT HEART CATH;  Surgeon: Jolaine Artist, MD;  Location: [facility] CATH LAB;  Service: Cardiovascular;  Laterality: N/A;       History of present illness and  Hospital Course:     Kindly  see H&P for history of present illness and admission details, please review complete Labs, Consult reports and Test reports for all details in brief  HPI  from the history and physical done on the day of admission 10/30/2016  HPI: Logan Burton is a 61 y.o. 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 He complains of decreased appetite, epigastric and RUQ pain, but denies nausea, emesis, diarrhea, melena or hematochezia. He denies fever, chills, sore throat, productive cough, chest pain, palpitations, dyspnea, lower extremity edema, but feels more fatigued.   Total bilirubin is 18.15 mg/dL. Alkaline phosphatase 585, AST 159, ALT of 181 units. BUN 22.1, creatinine 2.1 and glucose 165 mg/dL. Repeat CT scan of the abdomen shows new intrahepatic biliary tree dilation.    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 - Patient complains of abdominal pain, this is unclear if related to obstructive disease versus malignancy versus static malignancy, GI input greatly appreciated, ERCP would be very difficult, so IR intervention versus transfer to surgery care Center for complicated ERCP has been entertained , but after family meeting and discussion of goals of care , patient indicated he doesn't wish for any aggressive measures or chemotherapy, and main focus to be comfort and palliative care , palliative medicine consulted, well hospice service has been arranged on discharge , a shunt has been tolerating soft diet, no nausea or vomiting, abdominal pain controlled with current regimen.   Essential hypertension - with soft blood pressure, will stop Imdur and hydralazine, decrease metoprolol dose by half.  Coronary artery disease - Denies any chest pain or shortness of breath, continue with metoprolol, statin is on hold given liver metastasis  Chronic systolic CHF (congestive heart failure) (HCC) - Currently appears to be compensated, will hold hydralazine/Imdur discharge given soft blood pressure, decrease metoprolol dose, continue with digoxin, no ACEI/ARB renal failure  Chronic renal insufficiency - Stable, monitor closely  Dyslipidemia - Hold statin due to abnormal LFTs and jaundice.  COPD GOLD 0 - Stable at this time. - Supplemental oxygen and bronchodilators as needed.  Depression - Continue Wellbutrin 150 mg by mouth daily. - Continue Lexapro 20 mg by mouth daily.  Goals of care - DO NOT RESUSCITATE, be discharged on OxyContin 15 mg twice a day, given prescription #30 tablets, oxycodone IR 5 mg every 3 hours as needed, given a prescription 30 tablets, Decadron 1 mg twice a day distended appetite,  decreased fatigue and decreased cardiac inflammation, discharged on  laxative regimen, when necessary Zofran, and Robinul for secretions. - AICD has been deactivated by Med[employer] tech prior to Discharge   Discharge Condition:  D/C on Hospice   Follow UP  Follow-up Information    [facility]ospice [facility] Follow up.   Specialty:  Hospice and Palliative Medicine Why:  Home Hospice RN will contact you to arrange visit Contact information: [place] [id number] [phone]             Discharge Instructions  and  Discharge Medications    Discharge Instructions    Discharge instructions    Complete by:  As directed    Follow with Primary MD WEBB, Valla Leaver, MD    Activity: As tolerated with Full fall precautions use walker/cane & assistance as needed   Disposition Home    Diet: Heart Healthy  , with feeding assistance and aspiration precautions.  For Heart failure patients - Check your Weight same time everyday, if you gain over 2 pounds, or you develop in leg swelling, experience more shortness of breath  or chest pain, call your Primary MD immediately. Follow Cardiac Low Salt Diet and 1.5 lit/day fluid restriction.   On your next visit with your primary care physician please Get Medicines reviewed and adjusted.   Please request your Prim.MD to go over all Hospital Tests and Procedure/Radiological results at the follow up, please get all Hospital records sent to your Prim MD by signing hospital release before you go home.   If you experience worsening of your admission symptoms, develop shortness of breath, life threatening emergency, suicidal or homicidal thoughts you must seek medical attention immediately by calling 911 or calling your MD immediately  if symptoms less severe.  You Must read complete instructions/literature along with all the possible adverse reactions/side effects for all the Medicines you take and that have been prescribed to you.  Take any new Medicines after you have completely understood and accpet all the possible adverse reactions/side effects.   Do not drive, operating heavy machinery, perform activities at heights, swimming or participation in water activities or provide baby sitting services if your were admitted for syncope or siezures until you have seen by Primary MD or a Neurologist and advised to do so again.  Do not drive when taking Pain medications.    Do not take more than prescribed Pain, Sleep and Anxiety Medications  Special Instructions: If you have smoked or chewed Tobacco  in the last 2 yrs please stop smoking, stop any regular Alcohol  and or any Recreational drug use.  Wear Seat belts while driving.   Please note  You were cared for by a hospitalist during your hospital stay. If you have any questions about your discharge medications or the care you received while you were in the hospital after you are discharged, you can call the unit and asked to speak with the hospitalist on call if the hospitalist that took care of you is not available. Once you are discharged, your primary care physician will handle any further medical issues. Please note that NO REFILLS for any discharge medications will be authorized once you are discharged, as it is imperative that you return to your primary care physician (or establish a relationship with a primary care physician if you do not have one) for your aftercare needs so that they can reassess your need for medications and monitor your lab values.     Allergies as of 10/31/2016      Reactions   Tuberculin Tests    Pt states he had tb vaccine in his country. He states that if he now has a tb skin test for tuberculosis - it will always be positive. Pt states it is dangerous to have the skin tb tests.      Medication List    STOP taking these medications   atorvastatin 80 MG tablet Commonly known as:  LIPITOR   ezetimibe 10 MG tablet Commonly known as:   ZETIA   fenofibrate 160 MG tablet   Fish Oil 1000 MG Caps   hydrALAZINE 25 MG tablet Commonly known as:  APRESOLINE   HYDROcodone-acetaminophen 5-325 MG tablet Commonly known as:  NORCO/VICODIN   HYDROmorphone 4 MG tablet Commonly known as:  DILAUDID   isosorbide mononitrate 30 MG 24 hr tablet Commonly known as:  IMDUR   Vitamin D (Ergocalciferol) 50000 units Caps capsule Commonly known as:  DRISDOL     TAKE these medications   aspirin 81 MG tablet Take 81 mg by mouth daily.   buPROPion 150 MG 12 hr tablet  Commonly known as:  WELLBUTRIN SR Take 150 mg by mouth daily.   dexamethasone 1 MG tablet Commonly known as:  DECADRON Take 1 tablet (1 mg total) by mouth 2 (two) times daily before a meal. Start taking on:  11/01/2016   digoxin 0.125 MG tablet Commonly known as:  LANOXIN Take 0.0625 mg by mouth every other day.   escitalopram 20 MG tablet Commonly known as:  LEXAPRO Take 20 mg by mouth daily.   glycopyrrolate 1 MG tablet Commonly known as:  ROBINUL Take 1 tablet (1 mg total) by mouth every 4 (four) hours as needed (excessive secretions).   metoprolol succinate 25 MG 24 hr tablet Commonly known as:  TOPROL-XL Take 1 tablet (25 mg total) by mouth daily. Start taking on:  11/01/2016 What changed:  medication strength  how much to take   ondansetron 4 MG disintegrating tablet Commonly known as:  ZOFRAN-ODT Take 1 tablet (4 mg total) by mouth every 8 (eight) hours as needed for nausea or vomiting.   oxyCODONE 5 MG immediate release tablet Commonly known as:  Oxy IR/ROXICODONE Take 1 tablet (5 mg total) by mouth every 3 (three) hours as needed for breakthrough pain.   oxyCODONE 15 mg 12 hr tablet Commonly known as:  OXYCONTIN Take 1 tablet (15 mg total) by mouth every 12 (twelve) hours.   polyethylene glycol packet Commonly known as:  MIRALAX / GLYCOLAX Take 17 g by mouth daily as needed for mild constipation or moderate constipation.     senna-docusate 8.6-50 MG tablet Commonly known as:  Senokot-S Take 2 tablets by mouth 2 (two) times daily.         Diet and Activity recommendation: See Discharge Instructions above   Consults obtained -  GI IR   Major procedures and Radiology Reports - PLEASE review detailed and final reports for all details, in brief -      Ct Abdomen Pelvis Wo Contrast  Result Date: 10/12/2016 CLINICAL DATA:  Left lower quadrant abdominal pain for 2 weeks. EXAM: CT ABDOMEN AND PELVIS WITHOUT CONTRAST TECHNIQUE: Multidetector CT imaging of the abdomen and pelvis was performed following the standard protocol without IV contrast. COMPARISON:  None. FINDINGS: Lower chest: Multiple small nodules are noted in the visualized lung bases, with the largest measuring 6 mm. Hepatobiliary: Gallbladder appears to be contracted, without evidence of gallstones. Nearly 3 cm ill-defined low density is noted inferiorly in right hepatic lobe concerning for possible metastatic lesion. Pancreas: There appears to be pancreatic head enlargement concerning for pancreatic neoplasm or malignancy, although pancreatitis cannot be excluded. Spleen: Normal in size without focal abnormality. Adrenals/Urinary Tract: 2 cm right adrenal nodule is noted. Severe right renal atrophy is noted. Left kidney and ureter are unremarkable. Urinary bladder is decompressed. Stomach/Bowel: There is no evidence of bowel obstruction. Vascular/Lymphatic: Atherosclerosis of abdominal aorta is noted with 5.6 cm aneurysm noted in suprarenal abdominal aorta. Extensive adenopathy is noted in the superior retroperitoneal region as well as probable porta hepatis region concerning for metastatic disease. Reproductive: Mild prostatic enlargement is noted. Other: Mild inflammatory stranding is noted in mesenteric region. Musculoskeletal: No acute or significant osseous findings. IMPRESSION: Pancreatic head enlargement is noted concerning for pancreatic neoplasm or  malignancy, although pancreatitis cannot be excluded. Further evaluation with MRI is recommended. Retroperitoneal and porta hepatis adenopathy is noted concerning for metastatic disease. 3 cm low density is noted in right hepatic lobe concerning for possible metastatic lesion. Further evaluation with MRI is recommended. 5.6 cm aneurysm is seen  in the suprarenal abdominal aorta. Vascular surgery consultation recommended due to increased risk of rupture for AAA >5.5 cm. This recommendation follows ACR consensus guidelines: White Paper of the ACR Incidental Findings Committee II on Vascular Findings. J Am Coll Radiol 2013; 10:789-794. Multiple pulmonary nodules are noted in visualized lung bases concerning for possible metastatic disease given the above findings. These results will be called to the ordering clinician or representative by the Radiologist Assistant, and communication documented in the PACS or zVision Dashboard. Electronically Signed   By: Marijo Conception, M.D.   On: 10/12/2016 15:30   Ct Abdomen Wo Contrast  Result Date: 10/29/2016 CLINICAL DATA:  Hyperbilirubinemia. EXAM: CT ABDOMEN WITHOUT CONTRAST TECHNIQUE: Multidetector CT imaging of the abdomen was performed following the standard protocol without IV contrast. COMPARISON:  10/12/2016. FINDINGS: Lower chest: There are multiple pulmonary nodules identified within both lung bases. Compared with previous exam these are increased in number suspicious for metastatic disease. Hepatobiliary: The liver has an irregular contour compatible with cirrhosis. New intrahepatic bile duct dilatation. Mass within segment 5 of the liver measures 5.5 cm, image 34 of series 2. Previously 4.8 cm. There is diffuse gallbladder wall thickening. Pancreas: Exam detail is diminished due to lack of IV and oral contrast material. Mass in the region of the porta hepatis and head of pancreas is again noted. Difficult to assess due to limitations mentioned above. This measures  approximately 4.3 x 4.5 cm, image 25 of series 2. Head, body and tail of pancreas appear normal. Spleen: Normal in size without focal abnormality. Adrenals/Urinary Tract: Similar appearance of right adrenal nodule measuring 1.6 cm, image 21 of series 2. Normal appearance of the left adrenal gland. Unremarkable appearance of the left kidney. The right kidney appears atrophic. Stomach/Bowel: Stomach is within normal limits. No pathologic dilatation of the visualized upper abdominal bowel loops. Vascular/Lymphatic: Aortic atherosclerosis. Infrarenal abdominal aortic aneurysm measures 3.5 cm, image 41 of series 2. Retroperitoneal adenopathy is again identified. Index periaortic node measures 1.8 cm, image 33 of series 2. Previously 1.4 cm. Lymph node adjacent to the superior mesenteric artery measures 1.9 cm, image 27 of series 2. Previously 1.6 cm. Enlarged retrocaval node measures 1.5 cm, image 33 of series 2. Previously 1.7 cm. Other: No ascites.  No focal fluid collections. Musculoskeletal: No aggressive lytic or sclerotic bone lesions. IMPRESSION: 1. Interval development of intrahepatic bile duct dilatation. This likely reflects mass effect due to lesion in the region of the head of pancreas/porta hepatis. There is also wall thickening involving the gallbladder which may reflect obstruction of the cystic duct. 2. Right lobe of liver metastases is again noted and appears slightly increased in size from previous exam. 3. Metastatic adenopathy noted within the upper abdomen. 4. Aortic atherosclerosis 5. Infrarenal abdominal aortic aneurysm. Recommend followup by ultrasound in 2 years. This recommendation follows ACR consensus guidelines: White Paper of the ACR Incidental Findings Committee II on Vascular Findings. J Am Coll Radiol 2013; 10:789-794. Electronically Signed   By: Kerby Moors M.D.   On: 10/29/2016 13:55   US Abdomen Complete  Result Date: 10/27/2016 CLINICAL DATA:  Known hepatic lesion and likely  pancreatic enlargement with jaundice EXAM: ABDOMEN ULTRASOUND COMPLETE COMPARISON:  10/12/2016 FINDINGS: Gallbladder: Echogenic material is noted within although no definitive gallstones are seen. This likely represents tumefactive sludge. Mild gallbladder wall thickening is noted similar to that seen on prior CT examination Common bile duct: Diameter: 5.9 mm. Liver: Multiple hypoechoic lesions are identified throughout the liver. Largest of  these corresponds to that seen on recent CT examination measuring approximately 5.8 cm in greatest length. The multiplicity of lesions with was not well appreciated on prior CT due to the lack of IV contrast. IVC: Not well visualized Pancreas: Not well visualized Spleen: Size and appearance within normal limits. Right Kidney: Length: 5.9 cm. Diffuse atrophy is noted similar to that seen on recent CT examination. Left Kidney: Length: 14.4 cm. Echogenicity within normal limits. No mass or hydronephrosis visualized. Abdominal aorta: No aneurysm visualized. Other findings: None. IMPRESSION: Multiple hepatic lesions consistent with metastatic disease. The overall appearance is worse than that seen on the prior exam although the degree of visualization was limited on the prior study due to the lack of IV contrast. Tissue sampling is recommended for further evaluation. Electronically Signed   By: Inez Catalina M.D.   On: 10/27/2016 09:45    Micro Results     Recent Results (from the past 240 hour(s))  MRSA PCR Screening     Status: None   Collection Time: 10/29/16 11:24 PM  Result Value Ref Range Status   MRSA by PCR NEGATIVE NEGATIVE Final    Comment:        The GeneXpert MRSA Assay (FDA approved for NASAL specimens only), is one component of a comprehensive MRSA colonization surveillance program. It is not intended to diagnose MRSA infection nor to guide or monitor treatment for MRSA infections.        Today   Subjective:   Ellard Nan today has no  headache,no chest pain,No nausea or vomiting, abdominal pain is controlled on current regimen, reports he had could not sleep .  Objective:   Blood pressure 105/68, pulse 84, temperature 97.8 F (36.6 C), temperature source Oral, resp. rate 18, height 6' (1.829 m), weight 82.1 kg (181 lb), SpO2 95 %.   Intake/Output Summary (Last 24 hours) at 10/31/16 1726 Last data filed at 10/31/16 1300  Gross per 24 hour  Intake          1601.67 ml  Output                0 ml  Net          1601.67 ml    Exam Awake Alert, Oriented x 3,Jaundiced Supple Neck,No JVD Symmetrical Chest wall movement, Good air movement bilaterally, CTAB RRR,No Gallops,Rubs or new Murmurs, No Parasternal Heave +ve B.Sounds, Abd Soft, epigastric tenderness to palpation No rebound -guarding or rigidity. No Cyanosis, Clubbing or edema, No new Rash or bruise  Data Review   CBC w Diff:  Lab Results  Component Value Date   WBC 14.4 (H) 10/31/2016   HGB 12.6 (L) 10/31/2016   HGB 16.0 10/26/2016   HCT 37.5 (L) 10/31/2016   HCT 46.0 10/26/2016   PLT 160 10/31/2016   PLT 207 10/26/2016   LYMPHOPCT 9 10/30/2016   LYMPHOPCT 8.6 (L) 10/26/2016   MONOPCT 9 10/30/2016   MONOPCT 8.8 10/26/2016   EOSPCT 4 10/30/2016   EOSPCT 2.1 10/26/2016   BASOPCT 0 10/30/2016   BASOPCT 0.2 10/26/2016    CMP:  Lab Results  Component Value Date   NA 130 (L) 10/31/2016   NA 133 (L) 10/29/2016   K 4.1 10/31/2016   K 4.7 10/29/2016   CL 98 (L) 10/31/2016   CO2 23 10/31/2016   CO2 23 10/29/2016   BUN 23 (H) 10/31/2016   BUN 22.1 10/29/2016   CREATININE 1.55 (H) 10/31/2016   CREATININE 2.1 (H) 10/29/2016  PROT 6.2 (L) 10/31/2016   PROT 7.5 10/29/2016   ALBUMIN 2.3 (L) 10/31/2016   ALBUMIN 2.7 (L) 10/29/2016   BILITOT 17.9 (H) 10/31/2016   BILITOT 18.15 (HH) 10/29/2016   ALKPHOS 431 (H) 10/31/2016   ALKPHOS 585 (H) 10/29/2016   AST 118 (H) 10/31/2016   AST 159 (H) 10/29/2016   ALT 121 (H) 10/31/2016   ALT 181 (H)  10/29/2016  .   Total Time in preparing paper work, data evaluation and todays exam - 35 minutes  Philopater Mucha M.D on 10/31/2016 at 5:26 PM  [facility]   Office  [phone]

## 2016-10-31 NOTE — Discharge Instructions (Signed)
Follow with Primary MD Logan Burton, CAROL D, MD    Activity: As tolerated with Full fall precautions use walker/cane & assistance as needed   Disposition Home    Diet: Heart Healthy  , with feeding assistance and aspiration precautions.  For Heart failure patients - Check your Weight same time everyday, if you gain over 2 pounds, or you develop in leg swelling, experience more shortness of breath or chest pain, call your Primary MD immediately. Follow Cardiac Low Salt Diet and 1.5 lit/day fluid restriction.   On your next visit with your primary care physician please Get Medicines reviewed and adjusted.   Please request your Prim.MD to go over all Hospital Tests and Procedure/Radiological results at the follow up, please get all Hospital records sent to your Prim MD by signing hospital release before you go home.   If you experience worsening of your admission symptoms, develop shortness of breath, life threatening emergency, suicidal or homicidal thoughts you must seek medical attention immediately by calling 911 or calling your MD immediately  if symptoms less severe.  You Must read complete instructions/literature along with all the possible adverse reactions/side effects for all the Medicines you take and that have been prescribed to you. Take any new Medicines after you have completely understood and accpet all the possible adverse reactions/side effects.   Do not drive, operating heavy machinery, perform activities at heights, swimming or participation in water activities or provide baby sitting services if your were admitted for syncope or siezures until you have seen by Primary MD or a Neurologist and advised to do so again.  Do not drive when taking Pain medications.    Do not take more than prescribed Pain, Sleep and Anxiety Medications  Special Instructions: If you have smoked or chewed Tobacco  in the last 2 yrs please stop smoking, stop any regular Alcohol  and or any  Recreational drug use.  Wear Seat belts while driving.   Please note  You were cared for by a hospitalist during your hospital stay. If you have any questions about your discharge medications or the care you received while you were in the hospital after you are discharged, you can call the unit and asked to speak with the hospitalist on call if the hospitalist that took care of you is not available. Once you are discharged, your primary care physician will handle any further medical issues. Please note that NO REFILLS for any discharge medications will be authorized once you are discharged, as it is imperative that you return to your primary care physician (or establish a relationship with a primary care physician if you do not have one) for your aftercare needs so that they can reassess your need for medications and monitor your lab values.

## 2016-10-31 NOTE — Care Management Note (Signed)
Case Management Note  Patient Details  Name: Logan Burton MRN: [id number] Date of Birth: 1955/09/23  Subjective/Objective:  Adenocarcinoma, metastatic disease                  Action/Plan: Discharge Planning: NCM spoke to pt, sister and niece at bedside. Offered choice for Home Hospice/list provided. Agreeable to [place]. [place] with new referral. Pt states no equipment needed. Has good family support.   PCP Maurice Small  Expected Discharge Date:  10/31/2016              Expected Discharge Plan:  Home w Hospice Care  In-House Referral:  NA  Discharge planning Services  CM Consult  Post Acute Care Choice:  Hospice Choice offered to:  Patient  DME Arranged:  N/A DME Agency:  NA  HH Arranged:  RN [place] Agency:  [facility]  Status of Service:  Completed, signed off  If discussed at [place] of Stay Meetings, dates discussed:    Additional Comments:  Erenest Rasher, RN 10/31/2016, 3:17 PM

## 2016-11-02 ENCOUNTER — Telehealth: Payer: Self-pay | Admitting: *Deleted

## 2016-11-02 DIAGNOSIS — Z515 Encounter for palliative care: Secondary | ICD-10-CM

## 2016-11-02 DIAGNOSIS — Z7189 Other specified counseling: Secondary | ICD-10-CM

## 2016-11-02 NOTE — Telephone Encounter (Signed)
Telephone call to patient to check status. Patient states he is doing very well. Pain has been managed well since going home with hospice care. Hospice will be coming daily.   He states he is very secure in his decision to not  pursue treatment. He thanks Dr. Benay Spice and all [facility] staff for the efforts to take care of him. He has decided that this is for the best. He is at peace. His family is at his home and support his decision. He wishes to spend his final time with his loved ones and not chasing a cure.  Patient was advised this office would be available if anything is needed he may call.

## 2016-11-06 ENCOUNTER — Emergency Department (HOSPITAL_COMMUNITY)

## 2016-11-06 ENCOUNTER — Inpatient Hospital Stay (HOSPITAL_COMMUNITY)
Admission: EM | Admit: 2016-11-06 | Discharge: 2016-11-10 | DRG: 469 | Disposition: A | Discharge status: Hospice | Attending: Internal Medicine | Admitting: Internal Medicine

## 2016-11-06 ENCOUNTER — Observation Stay (HOSPITAL_COMMUNITY): Admitting: Anesthesiology

## 2016-11-06 ENCOUNTER — Encounter (HOSPITAL_COMMUNITY)
Admission: EM | Disposition: A | Discharge status: Hospice | Payer: Self-pay | Source: Home / Self Care | Attending: Family Medicine

## 2016-11-06 ENCOUNTER — Inpatient Hospital Stay (HOSPITAL_COMMUNITY)

## 2016-11-06 ENCOUNTER — Encounter (HOSPITAL_COMMUNITY): Payer: Self-pay | Admitting: *Deleted

## 2016-11-06 DIAGNOSIS — N183 Chronic kidney disease, stage 3 (moderate): Secondary | ICD-10-CM | POA: Diagnosis present

## 2016-11-06 DIAGNOSIS — I739 Peripheral vascular disease, unspecified: Secondary | ICD-10-CM | POA: Diagnosis present

## 2016-11-06 DIAGNOSIS — S72009A Fracture of unspecified part of neck of unspecified femur, initial encounter for closed fracture: Secondary | ICD-10-CM

## 2016-11-06 DIAGNOSIS — E785 Hyperlipidemia, unspecified: Secondary | ICD-10-CM | POA: Diagnosis present

## 2016-11-06 DIAGNOSIS — W010XXA Fall on same level from slipping, tripping and stumbling without subsequent striking against object, initial encounter: Secondary | ICD-10-CM | POA: Diagnosis present

## 2016-11-06 DIAGNOSIS — S51011A Laceration without foreign body of right elbow, initial encounter: Secondary | ICD-10-CM | POA: Diagnosis present

## 2016-11-06 DIAGNOSIS — J449 Chronic obstructive pulmonary disease, unspecified: Secondary | ICD-10-CM | POA: Diagnosis present

## 2016-11-06 DIAGNOSIS — W19XXXA Unspecified fall, initial encounter: Secondary | ICD-10-CM | POA: Diagnosis not present

## 2016-11-06 DIAGNOSIS — G893 Neoplasm related pain (acute) (chronic): Secondary | ICD-10-CM | POA: Diagnosis present

## 2016-11-06 DIAGNOSIS — C259 Malignant neoplasm of pancreas, unspecified: Secondary | ICD-10-CM | POA: Diagnosis present

## 2016-11-06 DIAGNOSIS — N400 Enlarged prostate without lower urinary tract symptoms: Secondary | ICD-10-CM | POA: Diagnosis present

## 2016-11-06 DIAGNOSIS — Z79899 Other long term (current) drug therapy: Secondary | ICD-10-CM

## 2016-11-06 DIAGNOSIS — S72011A Unspecified intracapsular fracture of right femur, initial encounter for closed fracture: Principal | ICD-10-CM | POA: Diagnosis present

## 2016-11-06 DIAGNOSIS — I252 Old myocardial infarction: Secondary | ICD-10-CM | POA: Diagnosis not present

## 2016-11-06 DIAGNOSIS — C801 Malignant (primary) neoplasm, unspecified: Secondary | ICD-10-CM

## 2016-11-06 DIAGNOSIS — I255 Ischemic cardiomyopathy: Secondary | ICD-10-CM | POA: Diagnosis present

## 2016-11-06 DIAGNOSIS — Z7982 Long term (current) use of aspirin: Secondary | ICD-10-CM | POA: Diagnosis not present

## 2016-11-06 DIAGNOSIS — Z7189 Other specified counseling: Secondary | ICD-10-CM | POA: Diagnosis not present

## 2016-11-06 DIAGNOSIS — K831 Obstruction of bile duct: Secondary | ICD-10-CM | POA: Diagnosis present

## 2016-11-06 DIAGNOSIS — F329 Major depressive disorder, single episode, unspecified: Secondary | ICD-10-CM | POA: Diagnosis present

## 2016-11-06 DIAGNOSIS — I130 Hypertensive heart and chronic kidney disease with heart failure and stage 1 through stage 4 chronic kidney disease, or unspecified chronic kidney disease: Secondary | ICD-10-CM | POA: Diagnosis present

## 2016-11-06 DIAGNOSIS — I498 Other specified cardiac arrhythmias: Secondary | ICD-10-CM | POA: Diagnosis present

## 2016-11-06 DIAGNOSIS — Z66 Do not resuscitate: Secondary | ICD-10-CM | POA: Diagnosis present

## 2016-11-06 DIAGNOSIS — Z841 Family history of disorders of kidney and ureter: Secondary | ICD-10-CM

## 2016-11-06 DIAGNOSIS — Z87891 Personal history of nicotine dependence: Secondary | ICD-10-CM

## 2016-11-06 DIAGNOSIS — Z9181 History of falling: Secondary | ICD-10-CM | POA: Diagnosis not present

## 2016-11-06 DIAGNOSIS — K7200 Acute and subacute hepatic failure without coma: Secondary | ICD-10-CM | POA: Diagnosis present

## 2016-11-06 DIAGNOSIS — I10 Essential (primary) hypertension: Secondary | ICD-10-CM | POA: Diagnosis present

## 2016-11-06 DIAGNOSIS — Z95810 Presence of automatic (implantable) cardiac defibrillator: Secondary | ICD-10-CM | POA: Diagnosis not present

## 2016-11-06 DIAGNOSIS — S72051A Unspecified fracture of head of right femur, initial encounter for closed fracture: Secondary | ICD-10-CM | POA: Diagnosis not present

## 2016-11-06 DIAGNOSIS — Z09 Encounter for follow-up examination after completed treatment for conditions other than malignant neoplasm: Secondary | ICD-10-CM

## 2016-11-06 DIAGNOSIS — S72001A Fracture of unspecified part of neck of right femur, initial encounter for closed fracture: Secondary | ICD-10-CM

## 2016-11-06 DIAGNOSIS — C787 Secondary malignant neoplasm of liver and intrahepatic bile duct: Secondary | ICD-10-CM | POA: Diagnosis present

## 2016-11-06 DIAGNOSIS — S72001D Fracture of unspecified part of neck of right femur, subsequent encounter for closed fracture with routine healing: Secondary | ICD-10-CM | POA: Diagnosis not present

## 2016-11-06 DIAGNOSIS — R4182 Altered mental status, unspecified: Secondary | ICD-10-CM | POA: Diagnosis not present

## 2016-11-06 DIAGNOSIS — K838 Other specified diseases of biliary tract: Secondary | ICD-10-CM | POA: Diagnosis not present

## 2016-11-06 DIAGNOSIS — Z951 Presence of aortocoronary bypass graft: Secondary | ICD-10-CM

## 2016-11-06 DIAGNOSIS — Z981 Arthrodesis status: Secondary | ICD-10-CM | POA: Diagnosis not present

## 2016-11-06 DIAGNOSIS — N189 Chronic kidney disease, unspecified: Secondary | ICD-10-CM | POA: Diagnosis present

## 2016-11-06 DIAGNOSIS — I2510 Atherosclerotic heart disease of native coronary artery without angina pectoris: Secondary | ICD-10-CM | POA: Diagnosis not present

## 2016-11-06 DIAGNOSIS — S299XXA Unspecified injury of thorax, initial encounter: Secondary | ICD-10-CM | POA: Diagnosis not present

## 2016-11-06 DIAGNOSIS — I25810 Atherosclerosis of coronary artery bypass graft(s) without angina pectoris: Secondary | ICD-10-CM | POA: Diagnosis not present

## 2016-11-06 DIAGNOSIS — S72041A Displaced fracture of base of neck of right femur, initial encounter for closed fracture: Secondary | ICD-10-CM | POA: Diagnosis not present

## 2016-11-06 DIAGNOSIS — K7290 Hepatic failure, unspecified without coma: Secondary | ICD-10-CM | POA: Diagnosis not present

## 2016-11-06 DIAGNOSIS — I5022 Chronic systolic (congestive) heart failure: Secondary | ICD-10-CM | POA: Diagnosis present

## 2016-11-06 DIAGNOSIS — N261 Atrophy of kidney (terminal): Secondary | ICD-10-CM | POA: Diagnosis present

## 2016-11-06 DIAGNOSIS — G9341 Metabolic encephalopathy: Secondary | ICD-10-CM | POA: Diagnosis present

## 2016-11-06 DIAGNOSIS — Z515 Encounter for palliative care: Secondary | ICD-10-CM | POA: Diagnosis present

## 2016-11-06 DIAGNOSIS — I495 Sick sinus syndrome: Secondary | ICD-10-CM | POA: Diagnosis present

## 2016-11-06 DIAGNOSIS — Z471 Aftercare following joint replacement surgery: Secondary | ICD-10-CM | POA: Diagnosis not present

## 2016-11-06 DIAGNOSIS — Z96641 Presence of right artificial hip joint: Secondary | ICD-10-CM | POA: Diagnosis not present

## 2016-11-06 HISTORY — PX: HIP ARTHROPLASTY: SHX981

## 2016-11-06 LAB — BASIC METABOLIC PANEL
ANION GAP: 15 (ref 5–15)
BUN: 44 mg/dL — AB (ref 6–20)
CO2: 18 mmol/L — AB (ref 22–32)
Calcium: 13.1 mg/dL (ref 8.9–10.3)
Chloride: 100 mmol/L — ABNORMAL LOW (ref 101–111)
Creatinine, Ser: 1.72 mg/dL — ABNORMAL HIGH (ref 0.61–1.24)
GFR calc Af Amer: 47 mL/min — ABNORMAL LOW (ref >60)
GFR calc non Af Amer: 41 mL/min — ABNORMAL LOW (ref >60)
GLUCOSE: 139 mg/dL — AB (ref 65–99)
POTASSIUM: 4.1 mmol/L (ref 3.5–5.1)
Sodium: 133 mmol/L — ABNORMAL LOW (ref 135–145)

## 2016-11-06 LAB — CBC WITH DIFFERENTIAL/PLATELET
BASOS PCT: 0 %
Basophils Absolute: 0 10*3/uL (ref 0.0–0.1)
EOS ABS: 0 10*3/uL (ref 0.0–0.7)
Eosinophils Relative: 0 %
HEMATOCRIT: 41.4 % (ref 39.0–52.0)
Hemoglobin: 14.2 g/dL (ref 13.0–17.0)
LYMPHS ABS: 1.1 10*3/uL (ref 0.7–4.0)
Lymphocytes Relative: 4 %
MCH: 29 pg (ref 26.0–34.0)
MCHC: 34.3 g/dL (ref 30.0–36.0)
MCV: 84.7 fL (ref 78.0–100.0)
MONO ABS: 1.9 10*3/uL — AB (ref 0.1–1.0)
Monocytes Relative: 7 %
NEUTROS ABS: 24.1 10*3/uL — AB (ref 1.7–7.7)
Neutrophils Relative %: 89 %
Platelets: 210 10*3/uL (ref 150–400)
RBC: 4.89 MIL/uL (ref 4.22–5.81)
RDW: 20.5 % — AB (ref 11.5–15.5)
WBC: 27.1 10*3/uL — ABNORMAL HIGH (ref 4.0–10.5)

## 2016-11-06 LAB — SURGICAL PCR SCREEN
MRSA, PCR: NEGATIVE
Staphylococcus aureus: NEGATIVE

## 2016-11-06 SURGERY — HEMIARTHROPLASTY, HIP, DIRECT ANTERIOR APPROACH, FOR FRACTURE
Anesthesia: General | Laterality: Right

## 2016-11-06 MED ORDER — DEXAMETHASONE 0.5 MG PO TABS
1.0000 mg | ORAL_TABLET | Freq: Two times a day (BID) | ORAL | Status: DC
  Administered 2016-11-06 – 2016-11-09 (×7): 1 mg via ORAL
  Filled 2016-11-06 (×7): qty 2
  Filled 2016-11-06: qty 1
  Filled 2016-11-06: qty 2

## 2016-11-06 MED ORDER — HYDROCODONE-ACETAMINOPHEN 5-325 MG PO TABS
1.0000 | ORAL_TABLET | Freq: Four times a day (QID) | ORAL | Status: DC | PRN
  Administered 2016-11-09: 2 via ORAL
  Filled 2016-11-06: qty 2

## 2016-11-06 MED ORDER — BUPROPION HCL ER (SR) 150 MG PO TB12
150.0000 mg | ORAL_TABLET | Freq: Every day | ORAL | Status: DC
  Administered 2016-11-07 – 2016-11-09 (×3): 150 mg via ORAL
  Filled 2016-11-06 (×3): qty 1

## 2016-11-06 MED ORDER — METOCLOPRAMIDE HCL 5 MG/ML IJ SOLN
5.0000 mg | Freq: Three times a day (TID) | INTRAMUSCULAR | Status: DC | PRN
Start: 2016-11-06 — End: 2016-11-10

## 2016-11-06 MED ORDER — ALBUMIN HUMAN 5 % IV SOLN
INTRAVENOUS | Status: AC
  Filled 2016-11-06: qty 250

## 2016-11-06 MED ORDER — TRANEXAMIC ACID 1000 MG/10ML IV SOLN
INTRAVENOUS | Status: DC | PRN
  Administered 2016-11-06: 2000 mg via INTRAVENOUS

## 2016-11-06 MED ORDER — ISOPROPYL ALCOHOL 70 % SOLN
Status: DC | PRN
  Administered 2016-11-06: 1 via TOPICAL

## 2016-11-06 MED ORDER — MIDAZOLAM HCL 5 MG/5ML IJ SOLN
INTRAMUSCULAR | Status: DC | PRN
  Administered 2016-11-06 (×2): 1 mg via INTRAVENOUS

## 2016-11-06 MED ORDER — ASPIRIN EC 81 MG PO TBEC
81.0000 mg | DELAYED_RELEASE_TABLET | Freq: Two times a day (BID) | ORAL | Status: DC
  Administered 2016-11-07 – 2016-11-09 (×5): 81 mg via ORAL
  Filled 2016-11-06 (×5): qty 1

## 2016-11-06 MED ORDER — MORPHINE SULFATE (PF) 4 MG/ML IV SOLN: 1.0000 mg | INTRAVENOUS | Status: DC | PRN

## 2016-11-06 MED ORDER — KETAMINE HCL 10 MG/ML IJ SOLN
INTRAMUSCULAR | Status: DC | PRN
  Administered 2016-11-06 (×4): 5 mg via INTRAVENOUS
  Administered 2016-11-06: 10 mg via INTRAVENOUS
  Administered 2016-11-06: 5 mg via INTRAVENOUS
  Administered 2016-11-06 (×2): 10 mg via INTRAVENOUS

## 2016-11-06 MED ORDER — PHENOL 1.4 % MT LIQD: 1.0000 | OROMUCOSAL | Status: DC | PRN

## 2016-11-06 MED ORDER — PROMETHAZINE HCL 25 MG/ML IJ SOLN: 6.2500 mg | INTRAMUSCULAR | Status: DC | PRN

## 2016-11-06 MED ORDER — KETAMINE HCL 100 MG/ML IJ SOLN: INTRAMUSCULAR | Status: DC | PRN

## 2016-11-06 MED ORDER — LIDOCAINE 2% (20 MG/ML) 5 ML SYRINGE
INTRAMUSCULAR | Status: AC
  Filled 2016-11-06: qty 5

## 2016-11-06 MED ORDER — OXYCODONE HCL 5 MG PO TABS
5.0000 mg | ORAL_TABLET | ORAL | Status: DC | PRN
Start: 2016-11-06 — End: 2016-11-10
  Administered 2016-11-06 – 2016-11-08 (×3): 5 mg via ORAL
  Filled 2016-11-06 (×3): qty 1

## 2016-11-06 MED ORDER — DIGOXIN 125 MCG PO TABS
0.0625 mg | ORAL_TABLET | ORAL | Status: DC
  Administered 2016-11-07 – 2016-11-09 (×2): 0.0625 mg via ORAL
  Filled 2016-11-06 (×2): qty 1

## 2016-11-06 MED ORDER — VANCOMYCIN HCL IN DEXTROSE 1-5 GM/200ML-% IV SOLN
1000.0000 mg | INTRAVENOUS | Status: AC
  Administered 2016-11-06: 1000 mg via INTRAVENOUS
  Filled 2016-11-06: qty 200

## 2016-11-06 MED ORDER — ONDANSETRON HCL 4 MG/2ML IJ SOLN
INTRAMUSCULAR | Status: DC | PRN
  Administered 2016-11-06: 4 mg via INTRAVENOUS

## 2016-11-06 MED ORDER — ACETAMINOPHEN 325 MG PO TABS
650.0000 mg | ORAL_TABLET | Freq: Four times a day (QID) | ORAL | Status: DC | PRN
  Administered 2016-11-08 – 2016-11-09 (×3): 650 mg via ORAL
  Filled 2016-11-06 (×3): qty 2

## 2016-11-06 MED ORDER — SODIUM CHLORIDE 0.9 % IV SOLN
INTRAVENOUS | Status: AC
Start: 2016-11-06 — End: 2016-11-07
  Administered 2016-11-06: 22:00:00 via INTRAVENOUS

## 2016-11-06 MED ORDER — GLYCOPYRROLATE 1 MG PO TABS
1.0000 mg | ORAL_TABLET | ORAL | Status: DC | PRN
  Filled 2016-11-06: qty 1

## 2016-11-06 MED ORDER — METOPROLOL SUCCINATE ER 25 MG PO TB24
25.0000 mg | ORAL_TABLET | Freq: Every day | ORAL | Status: DC
  Administered 2016-11-07 – 2016-11-08 (×2): 25 mg via ORAL
  Filled 2016-11-06 (×2): qty 1

## 2016-11-06 MED ORDER — ALBUMIN HUMAN 5 % IV SOLN
INTRAVENOUS | Status: DC | PRN
  Administered 2016-11-06: 20:00:00 via INTRAVENOUS

## 2016-11-06 MED ORDER — FENTANYL CITRATE (PF) 250 MCG/5ML IJ SOLN
INTRAMUSCULAR | Status: AC
  Filled 2016-11-06: qty 5

## 2016-11-06 MED ORDER — SODIUM CHLORIDE 0.9 % IV SOLN
INTRAVENOUS | Status: AC
  Administered 2016-11-06 (×2): via INTRAVENOUS

## 2016-11-06 MED ORDER — SODIUM CHLORIDE 0.9 % IR SOLN
Status: DC | PRN
  Administered 2016-11-06: 1000 mL

## 2016-11-06 MED ORDER — TRANEXAMIC ACID 1000 MG/10ML IV SOLN
2000.0000 mg | Freq: Once | INTRAVENOUS | Status: DC
  Filled 2016-11-06: qty 20

## 2016-11-06 MED ORDER — ACETAMINOPHEN 650 MG RE SUPP
650.0000 mg | Freq: Four times a day (QID) | RECTAL | Status: DC | PRN
  Filled 2016-11-06: qty 1

## 2016-11-06 MED ORDER — DIGOXIN 0.0625 MG HALF TABLET: 0.0625 mg | ORAL_TABLET | ORAL | Status: DC

## 2016-11-06 MED ORDER — MENTHOL 3 MG MT LOZG: 1.0000 | LOZENGE | OROMUCOSAL | Status: DC | PRN

## 2016-11-06 MED ORDER — ONDANSETRON HCL 4 MG/2ML IJ SOLN
INTRAMUSCULAR | Status: AC
  Filled 2016-11-06: qty 2

## 2016-11-06 MED ORDER — HYDROMORPHONE HCL 1 MG/ML IJ SOLN
1.0000 mg | INTRAMUSCULAR | Status: DC | PRN
  Administered 2016-11-06 – 2016-11-07 (×4): 1 mg via INTRAVENOUS
  Filled 2016-11-06 (×4): qty 1

## 2016-11-06 MED ORDER — METOCLOPRAMIDE HCL 5 MG PO TABS
5.0000 mg | ORAL_TABLET | Freq: Three times a day (TID) | ORAL | Status: DC | PRN
Start: 2016-11-06 — End: 2016-11-10
  Filled 2016-11-06: qty 2

## 2016-11-06 MED ORDER — SENNOSIDES-DOCUSATE SODIUM 8.6-50 MG PO TABS
2.0000 | ORAL_TABLET | Freq: Two times a day (BID) | ORAL | Status: DC
  Administered 2016-11-07 – 2016-11-09 (×3): 2 via ORAL
  Filled 2016-11-06 (×4): qty 2

## 2016-11-06 MED ORDER — ASPIRIN EC 81 MG PO TBEC: 81.0000 mg | DELAYED_RELEASE_TABLET | Freq: Every day | ORAL | Status: DC

## 2016-11-06 MED ORDER — ONDANSETRON 4 MG PO TBDP: 4.0000 mg | ORAL_TABLET | Freq: Three times a day (TID) | ORAL | Status: DC | PRN

## 2016-11-06 MED ORDER — EPINEPHRINE PF 1 MG/ML IJ SOLN
INTRAMUSCULAR | Status: AC
  Filled 2016-11-06: qty 1

## 2016-11-06 MED ORDER — MORPHINE SULFATE (PF) 4 MG/ML IV SOLN: 0.5000 mg | INTRAVENOUS | Status: DC | PRN

## 2016-11-06 MED ORDER — FENTANYL CITRATE (PF) 100 MCG/2ML IJ SOLN: 50.0000 ug | Freq: Once | INTRAMUSCULAR | Status: DC

## 2016-11-06 MED ORDER — KETAMINE HCL 10 MG/ML IJ SOLN
INTRAMUSCULAR | Status: AC
  Filled 2016-11-06: qty 1

## 2016-11-06 MED ORDER — VANCOMYCIN HCL IN DEXTROSE 1-5 GM/200ML-% IV SOLN
1000.0000 mg | Freq: Two times a day (BID) | INTRAVENOUS | Status: AC
  Administered 2016-11-07: 1000 mg via INTRAVENOUS
  Filled 2016-11-06: qty 200

## 2016-11-06 MED ORDER — SODIUM CHLORIDE 0.9 % IR SOLN
Status: DC | PRN
Start: 2016-11-06 — End: 2016-11-06
  Administered 2016-11-06: 3000 mL

## 2016-11-06 MED ORDER — POLYETHYLENE GLYCOL 3350 17 G PO PACK: 17.0000 g | PACK | Freq: Every day | ORAL | Status: DC | PRN

## 2016-11-06 MED ORDER — POVIDONE-IODINE 10 % EX SWAB: 2.0000 "application " | Freq: Once | CUTANEOUS | Status: DC

## 2016-11-06 MED ORDER — OXYCODONE HCL ER 15 MG PO T12A
15.0000 mg | EXTENDED_RELEASE_TABLET | Freq: Two times a day (BID) | ORAL | Status: DC
  Administered 2016-11-07 – 2016-11-09 (×5): 15 mg via ORAL
  Filled 2016-11-06 (×6): qty 1

## 2016-11-06 MED ORDER — DEXAMETHASONE SODIUM PHOSPHATE 10 MG/ML IJ SOLN
INTRAMUSCULAR | Status: DC | PRN
  Administered 2016-11-06: 10 mg via INTRAVENOUS

## 2016-11-06 MED ORDER — PROPOFOL 10 MG/ML IV BOLUS
INTRAVENOUS | Status: DC | PRN
  Administered 2016-11-06 (×2): 20 mg via INTRAVENOUS

## 2016-11-06 MED ORDER — ESCITALOPRAM OXALATE 10 MG PO TABS
20.0000 mg | ORAL_TABLET | Freq: Every day | ORAL | Status: DC
  Administered 2016-11-07 – 2016-11-09 (×3): 20 mg via ORAL
  Filled 2016-11-06 (×3): qty 1

## 2016-11-06 MED ORDER — MIDAZOLAM HCL 2 MG/2ML IJ SOLN
INTRAMUSCULAR | Status: AC
  Filled 2016-11-06: qty 2

## 2016-11-06 MED ORDER — BUPIVACAINE IN DEXTROSE 0.75-8.25 % IT SOLN
INTRATHECAL | Status: DC | PRN
  Administered 2016-11-06: 1.8 mL via INTRATHECAL

## 2016-11-06 MED ORDER — CHLORHEXIDINE GLUCONATE 4 % EX LIQD
60.0000 mL | Freq: Once | CUTANEOUS | Status: AC
  Administered 2016-11-06: 4 via TOPICAL

## 2016-11-06 MED ORDER — PROPOFOL 10 MG/ML IV BOLUS
INTRAVENOUS | Status: AC
  Filled 2016-11-06: qty 20

## 2016-11-06 SURGICAL SUPPLY — 46 items
ADH SKN CLS APL DERMABOND .7 (GAUZE/BANDAGES/DRESSINGS) ×2
BAG SPEC THK2 15X12 ZIP CLS (MISCELLANEOUS)
BAG ZIPLOCK 12X15 (MISCELLANEOUS) IMPLANT
BONE CEMENT GENTAMICIN (Cement) ×9 IMPLANT
CAPT HIP HEMI 2 ×2 IMPLANT
CEMENT BONE GENTAMICIN 40 (Cement) IMPLANT
CEMENT RESTRICTOR DEPUY SZ 4 (Cement) ×2 IMPLANT
CHLORAPREP W/TINT 26ML (MISCELLANEOUS) ×3 IMPLANT
CLOTH BEACON ORANGE TIMEOUT ST (SAFETY) ×3 IMPLANT
COVER PERINEAL POST (MISCELLANEOUS) ×3 IMPLANT
DECANTER SPIKE VIAL GLASS SM (MISCELLANEOUS) ×3 IMPLANT
DERMABOND ADVANCED (GAUZE/BANDAGES/DRESSINGS) ×4
DERMABOND ADVANCED .7 DNX12 (GAUZE/BANDAGES/DRESSINGS) ×2 IMPLANT
DRAPE SHEET LG 3/4 BI-LAMINATE (DRAPES) ×6 IMPLANT
DRAPE STERI IOBAN 125X83 (DRAPES) ×3 IMPLANT
DRAPE U-SHAPE 47X51 STRL (DRAPES) ×6 IMPLANT
DRSG AQUACEL AG ADV 3.5X10 (GAUZE/BANDAGES/DRESSINGS) ×3 IMPLANT
DRSG TEGADERM 4X4.75 (GAUZE/BANDAGES/DRESSINGS) IMPLANT
ELECT PENCIL ROCKER SW 15FT (MISCELLANEOUS) ×3 IMPLANT
ELECT REM PT RETURN 15FT ADLT (MISCELLANEOUS) ×6 IMPLANT
EVACUATOR 1/8 PVC DRAIN (DRAIN) IMPLANT
GAUZE SPONGE 2X2 8PLY STRL LF (GAUZE/BANDAGES/DRESSINGS) IMPLANT
GAUZE SPONGE 4X4 12PLY STRL (GAUZE/BANDAGES/DRESSINGS) ×3 IMPLANT
GLOVE BIO SURGEON STRL SZ8.5 (GLOVE) ×6 IMPLANT
GLOVE BIOGEL PI IND STRL 8.5 (GLOVE) ×1 IMPLANT
GLOVE BIOGEL PI INDICATOR 8.5 (GLOVE) ×2
GOWN SPEC L3 XXLG W/TWL (GOWN DISPOSABLE) ×3 IMPLANT
HANDPIECE INTERPULSE COAX TIP (DISPOSABLE) ×3
HOOD PEEL AWAY FLYTE STAYCOOL (MISCELLANEOUS) ×3 IMPLANT
MANIFOLD NEPTUNE II (INSTRUMENTS) ×3 IMPLANT
MARKER SKIN DUAL TIP RULER LAB (MISCELLANEOUS) ×3 IMPLANT
NDL SPNL 18GX3.5 QUINCKE PK (NEEDLE) ×1 IMPLANT
NEEDLE SPNL 18GX3.5 QUINCKE PK (NEEDLE) ×3 IMPLANT
PACK ANTERIOR HIP CUSTOM (KITS) ×3 IMPLANT
SAW OSC TIP CART 19.5X105X1.3 (SAW) ×3 IMPLANT
SEALER BIPOLAR AQUA 6.0 (INSTRUMENTS) ×2 IMPLANT
SET HNDPC FAN SPRY TIP SCT (DISPOSABLE) IMPLANT
SOL PREP POV-IOD 4OZ 10% (MISCELLANEOUS) ×3 IMPLANT
SPONGE GAUZE 2X2 STER 10/PKG (GAUZE/BANDAGES/DRESSINGS) ×2
SUT ETHIBOND NAB CT1 #1 30IN (SUTURE) ×6 IMPLANT
SUT MNCRL AB 3-0 PS2 18 (SUTURE) ×3 IMPLANT
SUT MON AB 2-0 CT1 36 (SUTURE) ×6 IMPLANT
SUT VIC AB 2-0 CT1 27 (SUTURE) ×6
SUT VIC AB 2-0 CT1 TAPERPNT 27 (SUTURE) ×1 IMPLANT
SUT VLOC 180 0 24IN GS25 (SUTURE) ×3 IMPLANT
SYR 50ML LL SCALE MARK (SYRINGE) IMPLANT

## 2016-11-06 NOTE — ED Notes (Signed)
[place] at bedside suturing right elbow at present time.

## 2016-11-06 NOTE — H&P (Signed)
History and Physical    CLEMENTS TORO RXV:[id number] DOB: March 22, 1956 DOA: 11/06/2016  I have briefly reviewed the patient's prior medical records in [facility] [place]  PCP: Jonathon Bellows, MD  Patient coming from: Home  Chief Complaint: Fall and hip pain  HPI: Logan Burton is a 61 y.o. male with medical history significant of coronary artery disease history of MI and CABG in the past, chronic systolic CHF, chronic kidney disease stage III, recently diagnosed pancreatic cancer with metastasis to the liver with obstructive jaundice, currently on hospice, presents to the emergency room with fall.  Patient still lives by himself, however his family (sisters) are close by and to help him once in a while.  Patient's sister who is in the room was with the patient himself, when she heard a thud from the bathroom, and when she arrived patient was on the floor complaining of right hip pain.  She tells me that the patient was awake when she found him, however he was saying that he cannot see anything and all around him is black.  He has little recollection of the fall itself.  He denies any fevers or chills, he denies any chest pain or shortness of breath.  He has chronic abdominal pain related to his cancer which is at baseline.  He denies any lightheadedness or dizziness.  He denies any palpitations.  He has nausea and poor p.o. intake over the last several weeks again related to his cancer.  ED Course: In the emergency room, patient's vital signs are stable, his blood work is significant for elevation in creatinine consistent with prior values and with his chronic kidney disease, he has leukocytosis with a white count of 27, and he underwent a CT scan of the femur which showed mildly displaced, impacted and angulated subcapital fracture of the right femur without any underlying pathologic features.  Orthopedic surgery to weight in wether this is something that they can operate upon  Review of Systems: As per HPI  otherwise 10 point review of systems negative.   Past Medical History:  Diagnosis Date  . CHF (congestive heart failure) ([facility])   . Chronic renal insufficiency   . Chronotropic incompetence with sinus node dysfunction ? Iatrogenic 01/22/2014  . Coronary artery disease    s/p CABG  . Dyslipidemia   . 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 Myocardial infarction   . PVD (peripheral vascular disease) ([facility])     Past Surgical History:  Procedure Laterality Date  . ANGIOPLASTY    . CERVICAL FUSION    . CORONARY ARTERY BYPASS GRAFT    . HERNIA REPAIR    . IMPLANTABLE CARDIOVERTER DEFIBRILLATOR IMPLANT  10/15/13   MDT single chamber ICD implanted by Dr Caryl Comes for primary prevention  . IMPLANTABLE CARDIOVERTER DEFIBRILLATOR IMPLANT N/A 10/15/2013   Procedure: IMPLANTABLE CARDIOVERTER DEFIBRILLATOR IMPLANT;  Surgeon: Deboraha Sprang, MD;  Location: [facility] CATH LAB;  Service: Cardiovascular;  Laterality: N/A;  . RIGHT HEART CATHETERIZATION N/A 09/20/2013   Procedure: RIGHT HEART CATH;  Surgeon: Jolaine Artist, MD;  Location: [facility] CATH LAB;  Service: Cardiovascular;  Laterality: N/A;     reports that he quit smoking about 10 years ago. His smoking use included Cigarettes. He has a 60.00 pack-year smoking history. He has never used smokeless tobacco. He reports that he drinks alcohol. He reports that he does not use drugs.  Allergies  Allergen Reactions  . Tuberculin Tests     Pt states he had tb vaccine  in [place] country. He states that if he now has a tb skin test for tuberculosis - it will always be positive. Pt states it is dangerous to have the skin tb tests.    Family History  Problem Relation Age of Onset  . Kidney failure Father     Prior to Admission medications   Medication Sig Start Date End Date Taking? Authorizing Provider  aspirin 81 MG tablet Take 81 mg by mouth daily.   Yes Historical Provider, MD  buPROPion (WELLBUTRIN SR) 150 MG 12 hr tablet Take 150 mg by  mouth daily.   Yes Historical Provider, MD  dexamethasone (DECADRON) 1 MG tablet Take 1 tablet (1 mg total) by mouth 2 (two) times daily before a meal. 11/01/16  Yes Albertine Patricia, MD  digoxin (LANOXIN) 0.125 MG tablet Take 0.0625 mg by mouth every other day.   Yes Historical Provider, MD  escitalopram (LEXAPRO) 20 MG tablet Take 20 mg by mouth daily.  12/18/12  Yes Historical Provider, MD  ezetimibe (ZETIA) 10 MG tablet Take 10 mg by mouth daily. 08/21/16  Yes Historical Provider, MD  glycopyrrolate (ROBINUL) 1 MG tablet Take 1 tablet (1 mg total) by mouth every 4 (four) hours as needed (excessive secretions). 10/31/16  Yes Silver Huguenin Elgergawy, MD  hydrALAZINE (APRESOLINE) 25 MG tablet Take 25 mg by mouth daily. 08/21/16  Yes Historical Provider, MD  HYDROmorphone (DILAUDID) 4 MG tablet Take 4 mg by mouth 2 (two) times daily. 10/26/16  Yes Historical Provider, MD  metoprolol succinate (TOPROL-XL) 25 MG 24 hr tablet Take 1 tablet (25 mg total) by mouth daily. 11/01/16  Yes Albertine Patricia, MD  ondansetron (ZOFRAN-ODT) 4 MG disintegrating tablet Take 1 tablet (4 mg total) by mouth every 8 (eight) hours as needed for nausea or vomiting. 10/31/16  Yes Albertine Patricia, MD  oxyCODONE (OXYCONTIN) 15 mg 12 hr tablet Take 1 tablet (15 mg total) by mouth every 12 (twelve) hours. 10/31/16  Yes Albertine Patricia, MD  polyethylene glycol (MIRALAX / GLYCOLAX) packet Take 17 g by mouth daily as needed for mild constipation or moderate constipation. 10/31/16  Yes Silver Huguenin Elgergawy, MD  senna-docusate (SENOKOT-S) 8.6-50 MG tablet Take 2 tablets by mouth 2 (two) times daily. 10/31/16  Yes Albertine Patricia, MD  oxyCODONE (OXY IR/ROXICODONE) 5 MG immediate release tablet Take 1 tablet (5 mg total) by mouth every 3 (three) hours as needed for breakthrough pain. Patient not taking: Reported on 11/06/2016 10/31/16   Albertine Patricia, MD    Physical Exam: Vitals:   11/06/16 0431 11/06/16 0640 11/06/16 0745 11/06/16 0923   BP: 122/84  102/86 117/83  Pulse: (!) 101 (!) 118 (!) 104 (!) 107  Resp: 20 18 18 17   Temp: 98.3 F (36.8 C)     TempSrc: Oral     SpO2: 92% 90% 93% (!) 89%      Constitutional: No apparent distress, visibly jaundiced male Vitals:   11/06/16 0431 11/06/16 0640 11/06/16 0745 11/06/16 0923  BP: 122/84  102/86 117/83  Pulse: (!) 101 (!) 118 (!) 104 (!) 107  Resp: 20 18 18 17   Temp: 98.3 F (36.8 C)     TempSrc: Oral     SpO2: 92% 90% 93% (!) 89%   Eyes: PERRL, lids and conjunctivae anicteric ENMT: Mucous membranes are dry. Posterior pharynx clear of any exudate or lesions. Neck: normal, supple, no masses, no thyromegaly Respiratory: clear to auscultation bilaterally, no wheezing, no crackles. Normal respiratory  effort. No accessory muscle use.  Cardiovascular: Regular rate and rhythm. No extremity edema. 2+ pedal pulses.  Abdomen: mild tenderness RUQ, no masses palpated. Bowel sounds positive.  Musculoskeletal: no clubbing / cyanosis. Normal muscle tone.  Skin: no rashes, lesions, ulcers. No induration Neurologic: non focal  Psychiatric: Normal judgment and insight. Alert and oriented x 3. Normal mood.   Labs on Admission: I have personally reviewed following labs and imaging studies  CBC:  Recent Labs Lab 10/31/16 0548 11/06/16 0700  WBC 14.4* 27.1*  NEUTROABS  --  24.1*  HGB 12.6* 14.2  HCT 37.5* 41.4  MCV 83.7 84.7  PLT 160 831   Basic Metabolic Panel:  Recent Labs Lab 10/31/16 0548 11/06/16 0700  NA 130* 133*  K 4.1 4.1  CL 98* 100*  CO2 23 18*  GLUCOSE 128* 139*  BUN 23* 44*  CREATININE 1.55* 1.72*  CALCIUM 10.9* 13.1*   GFR: Estimated Creatinine Clearance: 48.9 mL/min (A) (by C-G formula based on SCr of 1.72 mg/dL (H)). Liver Function Tests:  Recent Labs Lab 10/31/16 0548  AST 118*  ALT 121*  ALKPHOS 431*  BILITOT 17.9*  PROT 6.2*  ALBUMIN 2.3*   No results for input(s): LIPASE, AMYLASE in the last 168 hours. No results for input(s):  AMMONIA in the last 168 hours. Coagulation Profile:  Recent Labs Lab 10/30/16 1319 10/31/16 0548  INR 1.23 1.33   Cardiac Enzymes: No results for input(s): CKTOTAL, CKMB, CKMBINDEX, TROPONINI in the last 168 hours. BNP (last 3 results) No results for input(s): PROBNP in the last 8760 hours. HbA1C: No results for input(s): HGBA1C in the last 72 hours. CBG: No results for input(s): GLUCAP in the last 168 hours. Lipid Profile: No results for input(s): CHOL, HDL, LDLCALC, TRIG, CHOLHDL, LDLDIRECT in the last 72 hours. Thyroid Function Tests: No results for input(s): TSH, T4TOTAL, FREET4, T3FREE, THYROIDAB in the last 72 hours. Anemia Panel: No results for input(s): VITAMINB12, FOLATE, FERRITIN, TIBC, IRON, RETICCTPCT in the last 72 hours. Urine analysis: No results found for: COLORURINE, APPEARANCEUR, LABSPEC, PHURINE, GLUCOSEU, HGBUR, BILIRUBINUR, KETONESUR, PROTEINUR, UROBILINOGEN, NITRITE, LEUKOCYTESUR   Radiological Exams on Admission: Dg Chest 2 View  Result Date: 11/06/2016 CLINICAL DATA:  61 year old male with fall. Chest radiograph dated 10/16/2013 EXAM: CHEST  2 VIEW COMPARISON:  None. FINDINGS: There is shallow inspiration. Linear left lung base atelectasis/scarring. There is diffuse vascular prominence with areas of pulmonary nodularity which may represent pulmonary vascular congestion or pneumonia. There is no focal consolidation, pleural effusion, or pneumothorax. The cardiac silhouette is within normal limits. Median sternotomy wires and CABG vascular clips noted. There is tortuosity of the thoracic aorta. Left pectoral AICD device noted. No acute osseous pathology. IMPRESSION: 1. Findings may represent vascular congestion or atypical pneumonia. Clinical correlation is recommended. No focal consolidation. 2. Stable cardiomediastinal silhouette. Electronically Signed   By: Anner Crete M.D.   On: 11/06/2016 06:06   Ct Femur Right Wo Contrast  Result Date:  11/06/2016 CLINICAL DATA:  Right femoral neck fracture status post fall today. History of metastatic disease. Evaluate for pathologic fracture. EXAM: CT OF THE LOWER RIGHT EXTREMITY WITHOUT CONTRAST TECHNIQUE: Multidetector CT imaging of the right thigh was performed according to the standard protocol. COMPARISON:  Right hip radiographs 11/06/2016. Abdominopelvic CT 10/12/2016 and abdominal CT 10/19/2016. FINDINGS: Bones/Joint/Cartilage Images extend from the hip through the knee. There is a mildly impacted and comminuted subcapital fracture of the right femoral neck. This fracture demonstrates mild anterior and proximal displacement as well  as mild apex anterolateral angulation. No underlying lytic lesion is identified. There are no lytic or blastic lesions elsewhere within the right femur. The femoral head is located. No significant arthropathic changes at the right hip or knee. There is a small right hip joint effusion. Ligaments Not relevant for exam/indication. Muscles and Tendons Unremarkable.  The extensor mechanism appears intact at the knee. Soft tissues No evidence of periarticular hematoma or soft tissue mass. Mild femoral popliteal atherosclerosis. IMPRESSION: 1. The mildly displaced, impacted and angulated subcapital fracture of the right femur demonstrates no pathologic features. 2. No evidence of metastatic disease elsewhere in the right femur or right thigh soft tissues. Electronically Signed   By: Richardean Sale M.D.   On: 11/06/2016 09:53   Dg Hip Unilat W Or Wo Pelvis 2-3 Views Right  Result Date: 11/06/2016 CLINICAL DATA:  61 year old male with fall and right hip pain. EXAM: DG HIP (WITH OR WITHOUT PELVIS) 2-3V RIGHT COMPARISON:  None. FINDINGS: There is a transverse fracture of the right femoral neck with mild proximal migration and impaction. There is no dislocation. The bones are osteopenic. No other acute fracture identified. Mild arthritic changes of the hips bilaterally. The soft  tissues appear unremarkable. IMPRESSION: Mildly impacted fracture of the right femoral neck.  No dislocation. Electronically Signed   By: Anner Crete M.D.   On: 11/06/2016 06:07   Assessment/Plan Active Problems:   Essential hypertension   Coronary artery disease   Chronic systolic CHF (congestive heart failure) (HCC)   Chronic renal insufficiency   Dyslipidemia   COPD GOLD 0   Ischemic cardiomyopathy   Chronotropic incompetence with sinus node dysfunction ? Iatrogenic   Obstructive jaundice   Hip fracture (HCC)    Right hip fracture -Possibly due to a syncopal event in the setting of underlying cardiomyopathy with chronic systolic CHF/potential arrhythmia (his AICD is now turned off as he is receiving hospice care) -As evidenced by the x-ray and CT scan, orthopedic surgery to weigh in whether they will operate or not, keep patient n.p.o. until evaluated -Regardless of whether he will have surgery or not, will need to be admitted for pain control, PT evaluation. -Given poor p.o. intake, decline in the last week, may need residential hospice if he will have no surgery  Obstructive jaundice due to malignancy -Patient with liver metastasis, he is currently on hospice, he looks significantly icteric on exam, and has nausea as well, provide symptomatic treatment -He was evaluated by GI last time he was hospitalized, they considered an ERCP versus IR intervention, however patient did not wish for any aggressive measures  Essential hypertension -continue home medications  Coronary artery disease -No chest pain or shortness of breath, continue metoprolol  Chronic systolic CHF (congestive heart failure) ([place]) -He appears euvolemic, in fact dry, he has poor p.o. intake, and currently n.p.o., will provide gentle hydration for 10 hours -No ACE inhibitors or ARB due to renal failure -AICD has been deactivated by [employer] tech when he was discharged last week  Chronic kidney disease  stage III -Creatinine is at baseline  Dyslipidemia -Statin is on hold due to underlying liver disease  COPD -Stable, no wheezing  Depression -Continue Wellbutrin   DVT prophylaxis: SCD  Code Status: DNR  Family Communication: d/w sister bedside Disposition Plan: admit to [facility] called: Orthopedic surgery    Admission status: Observation  At the point of initial evaluation, it is my clinical opinion that admission for OBSERVATION is reasonable and necessary because the patient's  presenting complaints in the context of their chronic conditions represent sufficient risk of deterioration or significant morbidity to constitute reasonable grounds for close observation in the hospital setting, but that the patient may be medically stable for discharge from the hospital within 24 to 48 hours.   Logan Board, MD [facility] Pager [phone]  If 7PM-7AM, please contact night-coverage [email or web address] Password [facility]  11/06/2016, 9:56 AM

## 2016-11-06 NOTE — ED Notes (Signed)
Patient family at bedside  

## 2016-11-06 NOTE — Consult Note (Signed)
Logan Burton is an 61 y.o. male.    Chief Complaint: right hip pain  HPI: 61 y/o male with a non witnessed fall earlier this morning around 2 am. Pt currently on hospice for metastatic cancer. Family member states he got up to use the restroom and heard him fall. Pt c/o right hip pain and right elbow pain with bleeding. Denies any other injuries. Currently pt is more lucid but does have episodes of confusion as noted by recent nursing notes.  PCP:  Jonathon Bellows, MD  PMH: Past Medical History:  Diagnosis Date  . CHF (congestive heart failure) ([place])   . Chronic renal insufficiency   . Chronotropic incompetence with sinus node dysfunction ? Iatrogenic 01/22/2014  . Coronary artery disease    s/p CABG  . Dyslipidemia   . 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 Myocardial infarction   . PVD (peripheral vascular disease) ([facility])     PSH: Past Surgical History:  Procedure Laterality Date  . ANGIOPLASTY    . CERVICAL FUSION    . CORONARY ARTERY BYPASS GRAFT    . HERNIA REPAIR    . IMPLANTABLE CARDIOVERTER DEFIBRILLATOR IMPLANT  10/15/13   MDT single chamber ICD implanted by Dr Caryl Comes for primary prevention  . IMPLANTABLE CARDIOVERTER DEFIBRILLATOR IMPLANT N/A 10/15/2013   Procedure: IMPLANTABLE CARDIOVERTER DEFIBRILLATOR IMPLANT;  Surgeon: Deboraha Sprang, MD;  Location: [facility] CATH LAB;  Service: Cardiovascular;  Laterality: N/A;  . RIGHT HEART CATHETERIZATION N/A 09/20/2013   Procedure: RIGHT HEART CATH;  Surgeon: Jolaine Artist, MD;  Location: [facility] CATH LAB;  Service: Cardiovascular;  Laterality: N/A;    Social History:  reports that he quit smoking about 10 years ago. His smoking use included Cigarettes. He has a 60.00 pack-year smoking history. He has never used smokeless tobacco. He reports that he drinks alcohol. He reports that he does not use drugs.  Allergies:  Allergies  Allergen Reactions  . Tuberculin Tests     Pt states he had tb vaccine in his country. 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 Pt states it is dangerous to have the skin tb tests.    Medications: Current Facility-Administered Medications  Medication Dose Route Frequency Provider Last Rate Last Dose  . aspirin tablet 81 mg  81 mg Oral Daily Antonietta Breach, PA-C      . buPROPion [facility] SR) 12 hr tablet 150 mg  150 mg Oral Daily Antonietta Breach, PA-C      . dexamethasone (DECADRON) tablet 1 mg  1 mg Oral BID AC Antonietta Breach, PA-C   1 mg at 11/06/16 0751  . digoxin (LANOXIN) tablet 0.0625 mg  0.0625 mg Oral QODAY Antonietta Breach, PA-C      . escitalopram (LEXAPRO) tablet 20 mg  20 mg Oral Daily Antonietta Breach, PA-C      . glycopyrrolate (ROBINUL) tablet 1 mg  1 mg Oral Q4H PRN Antonietta Breach, PA-C      . metoprolol succinate (TOPROL-XL) 24 hr tablet 25 mg  25 mg Oral Daily Antonietta Breach, PA-C      . ondansetron (ZOFRAN-ODT) disintegrating tablet 4 mg  4 mg Oral Q8H PRN Antonietta Breach, PA-C      . oxyCODONE (Oxy IR/ROXICODONE) immediate release tablet 5 mg  5 mg Oral Q3H PRN Antonietta Breach, PA-C   5 mg at 11/06/16 0751  . oxyCODONE (OXYCONTIN) 12 hr tablet 15 mg  15 mg Oral Q12H  Antonietta Breach, PA-C      . polyethylene glycol (MIRALAX / GLYCOLAX) packet 17 g  17 g Oral Daily PRN Antonietta Breach, PA-C      . senna-docusate (Senokot-S) tablet 2 tablet  2 tablet Oral BID Antonietta Breach, PA-C       Current Outpatient Prescriptions  Medication Sig Dispense Refill  . aspirin 81 MG tablet Take 81 mg by mouth daily.    Marland Kitchen buPROPion (WELLBUTRIN SR) 150 MG 12 hr tablet Take 150 mg by mouth daily.    Marland Kitchen dexamethasone (DECADRON) 1 MG tablet Take 1 tablet (1 mg total) by mouth 2 (two) times daily before a meal. 60 tablet 0  . digoxin (LANOXIN) 0.125 MG tablet Take 0.0625 mg by mouth every other day.    . escitalopram (LEXAPRO) 20 MG tablet Take 20 mg by mouth daily.     Marland Kitchen glycopyrrolate (ROBINUL) 1 MG tablet Take 1 tablet (1 mg total) by mouth every 4 (four) hours as needed  (excessive secretions). 20 tablet 0  . metoprolol succinate (TOPROL-XL) 25 MG 24 hr tablet Take 1 tablet (25 mg total) by mouth daily. 30 tablet 0  . ondansetron (ZOFRAN-ODT) 4 MG disintegrating tablet Take 1 tablet (4 mg total) by mouth every 8 (eight) hours as needed for nausea or vomiting. 20 tablet 0  . oxyCODONE (OXY IR/ROXICODONE) 5 MG immediate release tablet Take 1 tablet (5 mg total) by mouth every 3 (three) hours as needed for breakthrough pain. 30 tablet 0  . oxyCODONE (OXYCONTIN) 15 mg 12 hr tablet Take 1 tablet (15 mg total) by mouth every 12 (twelve) hours. 30 tablet 0  . polyethylene glycol (MIRALAX / GLYCOLAX) packet Take 17 g by mouth daily as needed for mild constipation or moderate constipation. 14 each 2  . senna-docusate (SENOKOT-S) 8.6-50 MG tablet Take 2 tablets by mouth 2 (two) times daily. 60 tablet 0    Results for orders placed or performed during the hospital encounter of 11/06/16 (from the past 48 hour(s))  CBC with Differential     Status: Abnormal (Preliminary result)   Collection Time: 11/06/16  7:00 AM  Result Value Ref Range   WBC 27.1 (H) 4.0 - 10.5 K/uL   RBC 4.89 4.22 - 5.81 MIL/uL   Hemoglobin 14.2 13.0 - 17.0 g/dL   HCT 41.4 39.0 - 52.0 %   MCV 84.7 78.0 - 100.0 fL   MCH 29.0 26.0 - 34.0 pg   MCHC 34.3 30.0 - 36.0 g/dL   RDW 20.5 (H) 11.5 - 15.5 %   Platelets 210 150 - 400 K/uL   Neutrophils Relative % PENDING %   Neutro Abs PENDING 1.7 - 7.7 K/uL   Band Neutrophils PENDING %   Lymphocytes Relative PENDING %   Lymphs Abs PENDING 0.7 - 4.0 K/uL   Monocytes Relative PENDING %   Monocytes Absolute PENDING 0.1 - 1.0 K/uL   Eosinophils Relative PENDING %   Eosinophils Absolute PENDING 0.0 - 0.7 K/uL   Basophils Relative PENDING %   Basophils Absolute PENDING 0.0 - 0.1 K/uL   WBC Morphology PENDING    RBC Morphology PENDING    Smear Review PENDING    nRBC PENDING 0 /100 WBC   Metamyelocytes Relative PENDING %   Myelocytes PENDING %    Promyelocytes Absolute PENDING %   Blasts PENDING %   Dg Chest 2 View  Result Date: 11/06/2016 CLINICAL DATA:  61 year old male with fall. Chest radiograph dated 10/16/2013 EXAM: CHEST  2 VIEW  COMPARISON:  None. FINDINGS: There is shallow inspiration. Linear left lung base atelectasis/scarring. There is diffuse vascular prominence with areas of pulmonary nodularity which may represent pulmonary vascular congestion or pneumonia. There is no focal consolidation, pleural effusion, or pneumothorax. The cardiac silhouette is within normal limits. Median sternotomy wires and CABG vascular clips noted. There is tortuosity of the thoracic aorta. Left pectoral AICD device noted. No acute osseous pathology. IMPRESSION: 1. Findings may represent vascular congestion or atypical pneumonia. Clinical correlation is recommended. No focal consolidation. 2. Stable cardiomediastinal silhouette. Electronically Signed   By: Anner Crete M.D.   On: 11/06/2016 06:06   Dg Hip Unilat W Or Wo Pelvis 2-3 Views Right  Result Date: 11/06/2016 CLINICAL DATA:  61 year old male with fall and right hip pain. EXAM: DG HIP (WITH OR WITHOUT PELVIS) 2-3V RIGHT COMPARISON:  None. FINDINGS: There is a transverse fracture of the right femoral neck with mild proximal migration and impaction. There is no dislocation. The bones are osteopenic. No other acute fracture identified. Mild arthritic changes of the hips bilaterally. The soft tissues appear unremarkable. IMPRESSION: Mildly impacted fracture of the right femoral neck.  No dislocation. Electronically Signed   By: Anner Crete M.D.   On: 11/06/2016 06:07    ROS: ROS Right elbow laceration Unable to bear weight right lower extremity  Physical Exam: Physical Exam  Right elbow with small 2 cm superficial laceration Full rom of bilateral upper extremities Right hip with tenderness to pelvis Pain with any  Movement of the right lower leg nv intact distally No signs of open  injury  Assessment/Plan Assessment: right femur fracture, possible pathologic fracture  Plan: I had an extensive discussion with the patient's family regarding the nature of his injury. The patient has been living independently with home hospice. Apparently he has approximately 3 month prognosis. His function has reportedly been declining over the past week or so. His nephew, Shanon Brow, is an internal medicine doctor. We discussed the risks, benefits, and alternatives to non-operative treatment versus right hip hemiarthroplasty. The patient is concerned about the risk of immobility associated with nonoperative treatment. Obviously, hemiarthroplasty would allow him to mobilize.  They desire to proceed with surgery given this fact. There are concerns of him healing his wound given his medical condition. We discussed the risks, benefits, and alternatives. Continue nothing by mouth. Pain management as needed.   The risks, benefits, and alternatives were discussed with the patient. There are risks associated with the surgery including, but not limited to, problems with anesthesia (death), infection, instability (giving out of the joint), dislocation, differences in leg length/angulation/rotation, fracture of bones, loosening or failure of implants, hematoma (blood accumulation) which may require surgical drainage, blood clots, pulmonary embolism, nerve injury (foot drop and lateral thigh numbness), and blood vessel injury. The patient understands these risks and elects to proceed.

## 2016-11-06 NOTE — Op Note (Signed)
OPERATIVE REPORT   SURGEON: Rod Can, MD   ASSISTANT: Merla Riches, PA-C.  PREOPERATIVE DIAGNOSIS: Displaced Right femoral neck fracture.   POSTOPERATIVE DIAGNOSIS: Displaced Right femoral neck fracture.   PROCEDURE: Right hip hemiarthroplasty, posterior approach.   IMPLANTS: De[employer]uy [place] cemented femoral stem, size 7, standard offset with 12 mm cementralizer. DePuy modular femoral head, size 52 mm with +0 mm spacer. DePuy cement restrictor size 4. Market researcher.  ANESTHESIA:  Spinal  ESTIMATED BLOOD LOSS: 400 mL.  ANTIBIOTICS: 1 g vancomycin.  DRAINS: Medium HV x1.  SPECIMENS: Right femoral head for pathology.  COMPLICATIONS: None.   CONDITION: PACU - hemodynamically stable.    BRIEF CLINICAL NOTE: Logan Burton is a 61 y.o. 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 He has been receiving home health hospice. He tripped and fell last night while going to the bathroom, injuring his right hip. He was unable to weight-bear. He came to the emergency department at [facility]. X-rays of the right hip revealed a displaced subcapital femoral neck fracture. He was unable to have an MRI due to pacemaker placement. He did have a CT scan of the right femur that was negative for lytic lesions. He was admitted by the hospitalist service. Due to his multiple medical comorbidities, he was determined to be high risk for surgery. I discussed the risks, benefits, and alternatives with the patient and his family. They desired surgery due to the patient's amount of pain related to the fracture and also to prevent complications due to immobility.  PROCEDURE IN DETAIL: Surgical site was marked by myself. Once inside the operative room, general anesthesia was induced and a foley catheter was inserted. The patient was then positioned in  the lateral decubitus position. All bony prominences were well padded. The hip was prepped and draped in the normal sterile surgical fashion. A time-out was called verifying side and site of surgery. The patient received IV antibiotics within 60 minutes of beginning the procedure.  Posterior approach to the hip was performed. The capsule and short external rotators were taken down in one layer. The sciatic nerve was palpated and protected throughout the case.I identified the displaced femoral neck fracture. The femoral neck cut was made to the level of the templated cut. A corkscrew was placed into the head and the head was removed. There was no evidence of osseous tumor. The head was passed to the back table, was measured, and then sent for permanent pathology.  I examined the acetabulum. The cartilage and labrum were intact. I sized a 52 mm trial head which had excellent fit.  I then gained femoral exposure taking care to protect the abductors and greater trochanter.The capsule was peeled off the inner aspect of the greater trochanter. A cookie cutter was used to enter the femoral canal, and then the femoral canal finder was placed. Sequential reaming and broaching were performed up to a size 7. Calcar planer was used on the femoral neck remnant. I paced a standard offset neck and a trial head ball. The hip was reduced. I palpated leg lengths, which were restored. Stability was excellent. The hip was dislocated and trial components were removed. The femoral canal was irrigated and dried. I placed the cement restrictor. Epinephrine-soaked Ray-Tec's were packed into the femur. The cement was mixed. The Ray-Tec's were removed. The cement was pressurized into the femur. The femoral component was impacted into place and held while the cement hardened. Excess cement was removed.  I placed the real spacer and head ball. The hip was reduced. Leg lengths were palpated. At neutral abduction and 90  degrees of flexion, the hip was stable to 85 degrees of internal rotation. He was stable in potty position and in full extension / external rotation. Soft tissue tension was appropriate.  The wound was copiously irrigated with normal saline. Topical tranexamic acid solution was applied. Posterior capsule was repaired with #1 Vicryl. The wound was closed in layers using #1 Vicryl and V-Loc for the fascia over a medium Hemovac drain, 2-0 Vicryl for the subcutaneous fat, 2-0 Monocryl for the deep dermal layer, 3-0 running Monocryl subcuticular stitch, and Dermabond for the skin. Once the glue was fully dried, an Aquacell Ag dressing was applied. The patient was transported to the recovery room in stable condition. Sponge, needle, and instrument counts were correct at the end of the case x2. The patient tolerated the procedure well and there were no known complications.

## 2016-11-06 NOTE — ED Notes (Signed)
Family verbalizes pt rates pain poor and pt appears in more pain; request for more pain medication.

## 2016-11-06 NOTE — ED Notes (Signed)
Amy, hospice nurse, at bedside.

## 2016-11-06 NOTE — Discharge Instructions (Signed)
°Dr. Mariska Daffin °Joint Replacement Specialist °[facility] °[address] °[place],  [address] °[phone] ° ° °TOTAL HIP REPLACEMENT POSTOPERATIVE DIRECTIONS ° ° ° °Hip Rehabilitation, Guidelines Following Surgery  ° °WEIGHT BEARING °Weight bearing as tolerated with assist device (walker, cane, etc) as directed, use it as long as suggested by your surgeon or therapist, typically at least 4-6 weeks. ° °The results of a hip operation are greatly improved after range of motion and muscle strengthening exercises. Follow all safety measures which are given to protect your hip. If any of these exercises cause increased pain or swelling in your joint, decrease the amount until you are comfortable again. Then slowly increase the exercises. Call your caregiver if you have problems or questions.  ° °HOME CARE INSTRUCTIONS  °Most of the following instructions are designed to prevent the dislocation of your new hip.  °Remove items at home which could result in a fall. This includes throw rugs or furniture in walking pathways.  °Continue medications as instructed at time of discharge. °· You may have some home medications which will be placed on hold until you complete the course of blood thinner medication. °· You may start showering once you are discharged home. Do not remove your dressing. °Do not put on socks or shoes without following the instructions of your caregivers.   °Sit on chairs with arms. Use the chair arms to help push yourself up when arising.  °Arrange for the use of a toilet seat elevator so you are not sitting low.  °· Walk with walker as instructed.  °You may resume a sexual relationship in one month or when given the OK by your caregiver.  °Use walker as long as suggested by your caregivers.  °You may put full weight on your legs and walk as much as is comfortable. °Avoid periods of inactivity such as sitting longer than an hour when not asleep. This helps prevent  blood clots.  °You may return to work once you are cleared by your surgeon.  °Do not drive a car for 6 weeks or until released by your surgeon.  °Do not drive while taking narcotics.  °Wear elastic stockings for two weeks following surgery during the day but you may remove then at night.  °Make sure you keep all of your appointments after your operation with all of your doctors and caregivers. You should call the office at the above phone number and make an appointment for approximately two weeks after the date of your surgery. °Please pick up a stool softener and laxative for home use as long as you are requiring pain medications. °· ICE to the affected hip every three hours for 30 minutes at a time and then as needed for pain and swelling. Continue to use ice on the hip for pain and swelling from surgery. You may notice swelling that will progress down to the foot and ankle.  This is normal after surgery.  Elevate the leg when you are not up walking on it.   °It is important for you to complete the blood thinner medication as prescribed by your doctor. °· Continue to use the breathing machine which will help keep your temperature down.  It is common for your temperature to cycle up and down following surgery, especially at night when you are not up moving around and exerting yourself.  The breathing machine keeps your lungs expanded and your temperature down. ° °RANGE OF MOTION AND STRENGTHENING EXERCISES  °These exercises are   designed to help you keep full movement of your hip joint. Follow your caregiver's or physical therapist's instructions. Perform all exercises about fifteen times, three times per day or as directed. Exercise both hips, even if you have had only one joint replacement. These exercises can be done on a training (exercise) mat, on the floor, on a table or on a bed. Use whatever works the best and is most comfortable for you. Use music or television while you are exercising so that the exercises  are a pleasant break in your day. This will make your life better with the exercises acting as a break in routine you can look forward to.  Lying on your back, slowly slide your foot toward your buttocks, raising your knee up off the floor. Then slowly slide your foot back down until your leg is straight again.  Lying on your back spread your legs as far apart as you can without causing discomfort.  Lying on your side, raise your upper leg and foot straight up from the floor as far as is comfortable. Slowly lower the leg and repeat.  Lying on your back, tighten up the muscle in the front of your thigh (quadriceps muscles). You can do this by keeping your leg straight and trying to raise your heel off the floor. This helps strengthen the largest muscle supporting your knee.  Lying on your back, tighten up the muscles of your buttocks both with the legs straight and with the knee bent at a comfortable angle while keeping your heel on the floor.   SKILLED REHAB INSTRUCTIONS: If the patient is transferred to a skilled rehab facility following release from the hospital, a list of the current medications will be sent to the facility for the patient to continue.  When discharged from the skilled rehab facility, please have the facility set up the patient's [place] prior to being released. Also, the skilled facility will be responsible for providing the patient with their medications at time of release from the facility to include their pain medication and their blood thinner medication. If the patient is still at the rehab facility at time of the two week follow up appointment, the skilled rehab facility will also need to assist the patient in arranging follow up appointment in our office and any transportation needs.  MAKE SURE YOU:  Understand these instructions.  Will watch your condition.  Will get help right away if you are not doing well or get worse.  Pick up stool softner and  laxative for home use following surgery while on pain medications. Do not remove your dressing. The dressing is waterproof--it is OK to take showers. Continue to use ice for pain and swelling after surgery. Do not use any lotions or creams on the incision until instructed by your surgeon. Total Hip Protocol. POSTERIOR HIP PRECAUTIONS.

## 2016-11-06 NOTE — ED Provider Notes (Signed)
[facility] DEPT Provider Note   CSN: [id number] Arrival date & time: 11/06/16  0355    History   Chief Complaint Chief Complaint  Patient presents with  . Fall    HPI Ellie BENCE TRAPP is a 61 y.o. male.  61 y.o. 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 He reports that he was walking when his legs "got shaky" and generally weak causing him to fall to the ground. He has little complaints of pain at this time. He denies loss of consciousness or head trauma. No sensation changes. No nausea or vomiting.   The history is provided by the patient. No language interpreter was used.  Fall     Past Medical History:  Diagnosis Date  . CHF (congestive heart failure) ([place])   . Chronic renal insufficiency   . Chronotropic incompetence with sinus node dysfunction ? Iatrogenic 01/22/2014  . Coronary artery disease    s/p CABG  . Dyslipidemia   . 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 Myocardial infarction   . PVD (peripheral vascular disease) [facility])     Patient Active Problem List   Diagnosis Date Noted  . Palliative care by specialist   . Goals of care, counseling/discussion   . Advance care planning   . Terminal care   . Obstructive jaundice 10/29/2016  . Depression 10/29/2016  . RAS (renal artery stenosis) ([place])   . Chronotropic incompetence with sinus node dysfunction ? Iatrogenic 01/22/2014  . 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 Ischemic cardiomyopathy 03/02/2013  . COPD GOLD 0 02/23/2013  . Coronary artery disease   . Postsurgical aortocoronary bypass status   . Chronic systolic CHF (congestive heart failure) ([place])   .  Chronic renal insufficiency   . PVD (peripheral vascular disease) ([place])   . Dyslipidemia   . HYPERTHYROIDISM 01/26/2008  . MOOD SWINGS 01/26/2008  . Essential hypertension 01/26/2008    Past Surgical History:  Procedure Laterality Date  . ANGIOPLASTY    . CERVICAL FUSION    . CORONARY ARTERY BYPASS GRAFT    . HERNIA REPAIR    . IMPLANTABLE CARDIOVERTER DEFIBRILLATOR IMPLANT  10/15/13   MDT single chamber ICD implanted by Dr Caryl Comes for primary prevention  . IMPLANTABLE CARDIOVERTER DEFIBRILLATOR IMPLANT N/A 10/15/2013   Procedure: IMPLANTABLE CARDIOVERTER DEFIBRILLATOR IMPLANT;  Surgeon: Deboraha Sprang, MD;  Location: [facility] CATH LAB;  Service: Cardiovascular;  Laterality: N/A;  . RIGHT HEART CATHETERIZATION N/A 09/20/2013   Procedure: RIGHT HEART CATH;  Surgeon: Jolaine Artist, MD;  Location: [facility] CATH LAB;  Service: Cardiovascular;  Laterality: N/A;       Home Medications    Prior to Admission medications   Medication Sig Start Date End Date Taking? Authorizing Provider  aspirin 81 MG tablet Take 81 mg by mouth daily.    Historical Provider, MD  buPROPion (WELLBUTRIN SR) 150 MG 12 hr tablet Take 150 mg by mouth daily.    Historical Provider, MD  dexamethasone (DECADRON) 1 MG tablet Take 1 tablet (1 mg total) by mouth 2 (two) times daily before a meal. 11/01/16   Silver Huguenin Elgergawy, MD  digoxin (LANOXIN) 0.125 MG tablet Take 0.0625 mg by mouth every other day.    Historical Provider, MD  escitalopram (LEXAPRO) 20 MG tablet Take 20 mg by mouth daily.  12/18/12   Historical Provider, MD  glycopyrrolate (ROBINUL) 1 MG tablet Take 1 tablet (1 mg total) by mouth every 4 (four) hours as needed (excessive secretions). 10/31/16   Silver Huguenin Elgergawy, MD  metoprolol succinate (TOPROL-XL) 25 MG 24 hr tablet Take 1 tablet (25 mg total) by mouth daily. 11/01/16   Silver Huguenin Elgergawy, MD  ondansetron (ZOFRAN-ODT) 4 MG disintegrating tablet Take 1 tablet (4 mg total) by mouth every 8 (eight) hours as needed  for nausea or vomiting. 10/31/16   Albertine Patricia, MD  oxyCODONE (OXY IR/ROXICODONE) 5 MG immediate release tablet Take 1 tablet (5 mg total) by mouth every 3 (three) hours as needed for breakthrough pain. 10/31/16   Albertine Patricia, MD  oxyCODONE (OXYCONTIN) 15 mg 12 hr tablet Take 1 tablet (15 mg total) by mouth every 12 (twelve) hours. 10/31/16   Silver Huguenin Elgergawy, MD  polyethylene glycol (MIRALAX / GLYCOLAX) packet Take 17 g by mouth daily as needed for mild constipation or moderate constipation. 10/31/16   Silver Huguenin Elgergawy, MD  senna-docusate (SENOKOT-S) 8.6-50 MG tablet Take 2 tablets by mouth 2 (two) times daily. 10/31/16   Albertine Patricia, MD    Family History Family History  Problem Relation Age of Onset  . Kidney failure Father     Social History Social History  Substance Use Topics  . Smoking status: Former Smoker    Packs/day: 2.00    Years: 30.00    Types: Cigarettes    Quit date: 02/23/2006  . Smokeless tobacco: Never Used  . Alcohol use Yes     Comment: only rare social occasions     Allergies   Tuberculin tests   Review of Systems Review of Systems Ten systems reviewed and are negative for acute change, except as noted in the HPI.    Physical Exam Updated Vital Signs BP 122/84 (BP Location: Right Arm)   Pulse (!) 118   Temp 98.3 F (36.8 C) (Oral)   Resp 18   SpO2 90%   Physical Exam  Constitutional: He appears well-developed and well-nourished. No distress.  Cachectic appearing; pleasant.  HENT:  Head: Normocephalic and atraumatic.  No battle's sign or raccoon's eyes.  Eyes: Conjunctivae and EOM are normal. Scleral icterus is present.  Neck: Normal range of motion.  Cardiovascular: Regular rhythm and intact distal pulses.   Tachycardia  Pulmonary/Chest: Effort normal. No respiratory distress.  Respirations even and unlabored  Musculoskeletal:  No crepitus to palpation of the right hip. There appears to be leg shortening of the RLE with  mild external rotation.  Neurological: He is alert.  GCS 15. Patient answers questions appropriately; follows commands.  Skin: Skin is warm and dry. No rash noted. He is not diaphoretic.  Diffuse jaundice  Psychiatric: He has a normal mood and affect. His behavior is normal.  Nursing note and vitals reviewed.    ED Treatments / Results  Labs (all labs ordered are listed, but only abnormal results are displayed) Labs Reviewed  CBC WITH DIFFERENTIAL/PLATELET  BASIC METABOLIC PANEL    EKG  EKG Interpretation None       Radiology Dg Chest 2 View  Result Date: 11/06/2016 CLINICAL DATA:  61 year old male with fall. Chest radiograph dated 10/16/2013 EXAM: CHEST  2 VIEW COMPARISON:  None. FINDINGS: There is shallow inspiration. Linear left lung base atelectasis/scarring. There is diffuse vascular prominence with areas of pulmonary nodularity which may represent pulmonary vascular congestion or pneumonia. There is no focal consolidation, pleural effusion,  or pneumothorax. The cardiac silhouette is within normal limits. Median sternotomy wires and CABG vascular clips noted. There is tortuosity of the thoracic aorta. Left pectoral AICD device noted. No acute osseous pathology. IMPRESSION: 1. Findings may represent vascular congestion or atypical pneumonia. Clinical correlation is recommended. No focal consolidation. 2. Stable cardiomediastinal silhouette. Electronically Signed   By: Anner Crete M.D.   On: 11/06/2016 06:06   Dg Hip Unilat W Or Wo Pelvis 2-3 Views Right  Result Date: 11/06/2016 CLINICAL DATA:  61 year old male with fall and right hip pain. EXAM: DG HIP (WITH OR WITHOUT PELVIS) 2-3V RIGHT COMPARISON:  None. FINDINGS: There is a transverse fracture of the right femoral neck with mild proximal migration and impaction. There is no dislocation. The bones are osteopenic. No other acute fracture identified. Mild arthritic changes of the hips bilaterally. The soft tissues appear  unremarkable. IMPRESSION: Mildly impacted fracture of the right femoral neck.  No dislocation. Electronically Signed   By: Anner Crete M.D.   On: 11/06/2016 06:07    Procedures Procedures (including critical care time)  Medications Ordered in ED Medications  aspirin tablet 81 mg (not administered)  buPROPion (WELLBUTRIN SR) 12 hr tablet 150 mg (not administered)  dexamethasone (DECADRON) tablet 1 mg (not administered)  digoxin (LANOXIN) tablet 0.0625 mg (not administered)  escitalopram (LEXAPRO) tablet 20 mg (not administered)  glycopyrrolate (ROBINUL) tablet 1 mg (not administered)  metoprolol succinate (TOPROL-XL) 24 hr tablet 25 mg (not administered)  ondansetron (ZOFRAN-ODT) disintegrating tablet 4 mg (not administered)  oxyCODONE (Oxy IR/ROXICODONE) immediate release tablet 5 mg (not administered)  oxyCODONE (OXYCONTIN) 12 hr tablet 15 mg (not administered)  polyethylene glycol (MIRALAX / GLYCOLAX) packet 17 g (not administered)  senna-docusate (Senokot-S) tablet 2 tablet (not administered)     Initial Impression / Assessment and Plan / ED Course  I have reviewed the triage vital signs and the nursing notes.  Pertinent labs & imaging results that were available during my care of the patient were reviewed by me and considered in my medical decision making (see chart for details).    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 Patient neurovascularly intact with physical exam findings concerning for right hip fracture. Xray pending. Patient declines pain medications at this time.  6:00 AM Xray confirms impacted femoral neck fracture. No dislocation. This is appreciated to be due to profuse osteopenia. Will consult orthopedics.  6:45 AM Case discussed with Dr. Rolena Infante. Dr. Lyla Glassing to evaluate in the morning, though patient  likely nonoperative candidate given poor cancer prognosis. Risks and benefits of operative vs nonoperative management has been briefly discussed with the patient's sister. Sister understands that nonoperative management of this fracture could also prove fatal. Will consult hospitalist. Hospice aware of patient's presence in our department and will send nursing/SW in the morning. Plan for admission to facilitate transfer to more long term care facility as patient currently lives at home independently.  7:38 AM Patient signed out to Janetta Hora, PA-C at shift change who will follow up on labs. Plan for admission once labs resulted.   Final Clinical Impressions(s) / ED Diagnoses   Final diagnoses:  Closed fracture of neck of right femur, initial encounter [facility])    New Prescriptions New Prescriptions   No medications on file     Antonietta Breach, PA-C 11/06/16 3235    April Palumbo, MD 11/06/16 2324

## 2016-11-06 NOTE — ED Triage Notes (Signed)
Patient is alert and oriented x4.  Patient was at home and walking to the bathroom and fell.  EMS states that the patient has rotation and shortening of the left leg.

## 2016-11-06 NOTE — ED Notes (Signed)
Pt transported to CT ?

## 2016-11-06 NOTE — Progress Notes (Signed)
Call from OR asking for patient to be transported to OR. OR nurse states that Dr Lyla Glassing plans to go forth with surgery.  Explained to OR nurse that change of shift and unable unavailable to bring patient at this time. Or nurse advised that we call the floor supervisor. But, ultimately Logan Burton from PACU came to floor and transported pt to OR.

## 2016-11-06 NOTE — ED Notes (Addendum)
Pt returned from CT. IV in pt left hand and site bleeding. Gauze and tape applied to site R FA and blood cleansed from pt. Pt continues to verbalizes "pain is much better" rating 5/10 and verbalizes will alert staff if would like something else for pain.   Per Ruthann Cancer pt now NPO until CT read and decision made for surgery vs no surgery.

## 2016-11-06 NOTE — Progress Notes (Signed)
OR nurse called advising that Anesthesiology hesitant about taking patient for surgery due to patients medical history and lack of Cardiac work up/consult. Advised OR nurse that RN shared concerns. Or nurse states OR team contacting Dr Lyla Glassing to discuss and would call back to let floor know if Dr Lyla Glassing planned to go forth with surgery. Waiting on response.

## 2016-11-06 NOTE — ED Provider Notes (Signed)
7:30 AM: Pt signed out to me by K Humes PA-C. He is a 61 year old male with metastatic cancer with grim prognosis (<3 month, on Hospice) who presents with R hip pain after an unwitnessed fall early this morning. Found to have a right femoral neck fracture. At sign out basic labs are pending. Plan is to admit and have Ortho come to see.  8:30 AM: Dr. Doren Custard has seen patient. Please see his separate note for details. There is a small elbow lac that needs to be repaired.   LACERATION REPAIR Performed by: Recardo Evangelist Authorized by: Recardo Evangelist Consent: Verbal consent obtained. Risks and benefits: risks, benefits and alternatives were discussed Consent given by: patient Patient identity confirmed: provided demographic data Prepped and Draped in normal sterile fashion Wound explored  Laceration Location: Right elbow  Laceration Length: 2 cm  No Foreign Bodies seen or palpated  Anesthesia: local infiltration  Local anesthetic: lidocaine 2% with epinephrine  Anesthetic total: 5 ml  Irrigation method: syringe Amount of cleaning: standard  Skin closure: 5-0 Nylon  Number of sutures: 5  Technique: Simple interrupted  Patient tolerance: Patient tolerated the procedure well with no immediate complications.  8:48 AM Labs have resulted. Spoke with Dr. Cruzita Lederer who will admit patient     Iris Pert 11/06/16 4081    April Palumbo, MD 11/06/16 2324

## 2016-11-06 NOTE — Progress Notes (Signed)
Patient back from OR, bedside report received from [facility] PACU RN, davol drain intact, dressing to right hip intact, patient lethargic but able to follow commands, will continue to monitor.

## 2016-11-06 NOTE — Progress Notes (Addendum)
[facility] Liaison: RN visit  Visited with patient in the Emergency Department.   Sister was at bedside with patient.   Patient advised 6/10 pain for R hip pain.  Patient given oxyCODONE (Oxy IR/ROXICODONE) immediate release tablet 5 mg, Dose 5 mg at 0751 for pain.     Per sister, patient was walking from bedroom to bathroom and fell.  She states "he broke his R hip and has a cut on R arm".  Wife advised further that she was unsure if he had hit his head, "but he said everything was dark after he fell".    Patient was lying in bed, with eyes closed, when I arrived.  He states that his pain persists.  Lilia Pro, RN aware of pain.  Anticipated that patient will be admitted.  Patient/sister had no needs at this time.    Will continue to follow.  Thanks,  Edyth Gunnels, RN, BSN [facility] Liaison [phone]  All hospital liaison's are now on [place].

## 2016-11-06 NOTE — ED Notes (Signed)
Patient showing signs of confusion stating "I need to go to 71 when am I going to go?"  Family states that the patient was not making any sense.

## 2016-11-06 NOTE — Transfer of Care (Signed)
Immediate Anesthesia Transfer of Care Note  Patient: Logan Burton  Procedure(s) Performed: Procedure(s): ARTHROPLASTY BIPOLAR HIP (HEMIARTHROPLASTY) (Right)  Patient Location: PACU  Anesthesia Type:Regional  Level of Consciousness: awake, alert  and oriented  Airway & Oxygen Therapy: Patient Spontanous Breathing and Patient connected to face mask oxygen  Post-op Assessment: Report given to RN and Post -op Vital signs reviewed and stable  Post vital signs: Reviewed and stable  Last Vitals:  Vitals:   11/06/16 1804 11/06/16 2200  BP: 125/79   Pulse: 94   Resp: 18   Temp: 37.1 C (P) 36.3 C    Last Pain:  Vitals:   11/06/16 1804  TempSrc: Oral  PainSc:          Complications: No apparent anesthesia complications

## 2016-11-06 NOTE — Anesthesia Procedure Notes (Signed)
Spinal  Patient location during procedure: OR Start time: 11/06/2016 7:55 PM End time: 11/06/2016 7:55 PM Staffing Anesthesiologist: Bowie Doiron, Iona Beard Performed: anesthesiologist  Preanesthetic Checklist Completed: patient identified, site marked, surgical consent, pre-op evaluation, timeout performed, IV checked, risks and benefits discussed and monitors and equipment checked Spinal Block Patient position: right lateral decubitus Prep: Betadine Patient monitoring: heart rate, continuous pulse ox and blood pressure Location: L3-4 Injection technique: single-shot Needle Needle type: Spinocan  Needle gauge: 22 G Needle length: 9 cm Additional Notes Expiration date of kit checked and confirmed. Patient tolerated procedure well, without complications.

## 2016-11-06 NOTE — Anesthesia Preprocedure Evaluation (Addendum)
Anesthesia Evaluation  Patient identified by MRN, date of birth, ID band Patient awake  General Assessment Comment:[name] is a 61 y.o. 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    Pulmonary COPD, former smoker,    Pulmonary exam normal breath sounds clear to auscultation       Cardiovascular hypertension, + CAD, + Past MI, + CABG and +CHF  Normal cardiovascular exam Rhythm:Regular Rate:Normal  Left ventricle: The cavity size was normal. Wall thickness was   increased in a pattern of mild LVH. Systolic function was   moderately reduced. The estimated ejection fraction was in the   range of 35% to 40%. There is hypokinesis of the inferolateral   and inferior myocardium. Doppler parameters are consistent with   abnormal left ventricular relaxation (grade 1 diastolic   dysfunction).  Impressions:  - Hypokinesis of the inferior and inferolateral walls with overall   moderate LV dysfunction; mild LVH; grade 1 diastolic dysfunction  AICD deactivated   Neuro/Psych negative neurological ROS  negative psych ROS   GI/Hepatic negative GI ROS, Neg liver ROS,   Endo/Other  Hyperthyroidism   Renal/GU negative Renal ROS  negative genitourinary   Musculoskeletal negative musculoskeletal ROS (+)   Abdominal   Peds negative pediatric ROS (+)  Hematology negative hematology ROS (+)   Anesthesia Other Findings   Reproductive/Obstetrics negative OB  ROS                            Anesthesia Physical Anesthesia Plan  ASA: V  Anesthesia Plan: Spinal   Post-op Pain Management:    Induction: Intravenous  Airway Management Planned: Simple Face Mask  Additional Equipment:   Intra-op Plan:   Post-operative Plan:   Informed Consent: I have reviewed the patients History and Physical, chart, labs and discussed the procedure including the risks, benefits and alternatives for the proposed anesthesia with the patient or authorized representative who has indicated his/her understanding and acceptance.   Dental advisory given  Plan Discussed with: CRNA and Surgeon  Anesthesia Plan Comments:        Anesthesia Quick Evaluation

## 2016-11-06 NOTE — ED Notes (Signed)
Bed: WA17 Expected date:  Expected time:  Means of arrival:  Comments: Fall, hip injury, DNR, hospice

## 2016-11-07 DIAGNOSIS — I10 Essential (primary) hypertension: Secondary | ICD-10-CM

## 2016-11-07 LAB — BASIC METABOLIC PANEL
Anion gap: 10 (ref 5–15)
BUN: 57 mg/dL — ABNORMAL HIGH (ref 6–20)
CO2: 20 mmol/L — AB (ref 22–32)
CREATININE: 1.88 mg/dL — AB (ref 0.61–1.24)
Calcium: 11.5 mg/dL — ABNORMAL HIGH (ref 8.9–10.3)
Chloride: 105 mmol/L (ref 101–111)
GFR calc non Af Amer: 37 mL/min — ABNORMAL LOW (ref >60)
GFR, EST AFRICAN AMERICAN: 43 mL/min — AB (ref >60)
Glucose, Bld: 175 mg/dL — ABNORMAL HIGH (ref 65–99)
Potassium: 4.5 mmol/L (ref 3.5–5.1)
SODIUM: 135 mmol/L (ref 135–145)

## 2016-11-07 LAB — CBC
HCT: 34 % — ABNORMAL LOW (ref 39.0–52.0)
Hemoglobin: 11.2 g/dL — ABNORMAL LOW (ref 13.0–17.0)
MCH: 27.9 pg (ref 26.0–34.0)
MCHC: 32.9 g/dL (ref 30.0–36.0)
MCV: 84.8 fL (ref 78.0–100.0)
Platelets: 199 10*3/uL (ref 150–400)
RBC: 4.01 MIL/uL — ABNORMAL LOW (ref 4.22–5.81)
RDW: 20 % — AB (ref 11.5–15.5)
WBC: 19.7 10*3/uL — ABNORMAL HIGH (ref 4.0–10.5)

## 2016-11-07 LAB — AMMONIA: AMMONIA: 68 umol/L — AB (ref 9–35)

## 2016-11-07 MED ORDER — LACTULOSE 10 GM/15ML PO SOLN
30.0000 g | Freq: Two times a day (BID) | ORAL | Status: DC
  Administered 2016-11-08 (×2): 30 g via ORAL
  Filled 2016-11-07 (×3): qty 45

## 2016-11-07 NOTE — Care Management Note (Addendum)
Case Management Note  Patient Details  Name: Logan Burton MRN: [id number] Date of Birth: 12-08-1955  Subjective/Objective:    Right hip pain,  Right hip hemiarthroplasty             Action/Plan: Discharge Planning: Chart reviewed. Spoke to [facility] Liaison and if pt goes to SNF -rehab, they will have to revoke Hospice services and restart once he has completed rehab. Waiting PT recommendations.  NCM spoke to pt's sister and pt will need to go to SNF with Hospice or [facility]. He will not be able to go home. Will contact attending. Sister states she needs a letter for pt's brother to travel to [place] to see pt.  NCM spoke to attending and plan is for palliative consult.  NCM spoke to [facility] Liaison with update.   PCP Maurice Small MD  Expected Discharge Date:  11/08/16               Expected Discharge Plan:  Home w Hospice Care  In-House Referral:  NA  Discharge planning Services  CM Consult  Post Acute Care Choice:  Hospice, Resumption of Svcs/PTA Provider Choice offered to:  Spouse  DME Arranged:    DME Agency:     HH Arranged:    [place] Agency:     Status of Service:  In process, will continue to follow  If discussed at Long Length of Stay Meetings, dates discussed:    Additional Comments:  Erenest Rasher, RN 11/07/2016, 10:25 AM

## 2016-11-07 NOTE — Progress Notes (Signed)
Pt resting comfortable in bed. No visitors at bedside. Pt sleeping. No signs and symptoms of  distress. Respirations normal.

## 2016-11-07 NOTE — Progress Notes (Signed)
PROGRESS NOTE [facility]   ELIAS DENNINGTON   WUJ:[id number] DOB: Oct 13, 1955  DOA: 11/06/2016 PCP: Jonathon Bellows, MD   Brief Narrative:  61 year old male with medical history significant for CAD with CABG in the past chronic CHF chronic kidney disease stage III, recently diagnosed with pancreatic cancer with metastasis to the liver with obstructive jaundice currently on hospice presented to the emergency room after fall. Patient was admitted with right femoral displaced fracture. Status post right hip hemiarthroplasty.  Subjective: Patient seen and examined, very sleepy but arousable, sister at bedside report that he has been very restless and agitated. Patient received multiple doses of opioids early this morning.   Assessment & Plan: Right hip fracture s/p R hip hemiarthroplasty  Pain control as needed. Carefull with over medicating patient susceptible to sundowning/Delirium.  Pain meds adjusted will continue to monitor  Patient will need rehab and PT but patient also in hospice, if he goes to rehab will loss hospice services. Will discuss with family    Obstructive jaundice due to malignancy - very poor prognosis  Patient with liver metastasis, he is currently on hospice, he looks significantly icteric on exam.  He was evaluated by GI last time he was hospitalized, they considered an ERCP versus IR intervention, however patient did not wish for any aggressive measures. Will check Ammonia levels - given confusion and high risk for hepaticencephalopathy   Essential hypertension - BP stable Continue home medications  Chronic systolic CHF (congestive heart failure)  AICD has been deactivated by [employer] tech when he was discharged last week Seem to be compensated at this time. Will continue to monitor   Chronic kidney disease stage III Creatinine close to baseline  Monitor BMP in AM   COPD -Stable, no wheezing  Depression -Continue Wellbutrin  DVT prophylaxis: ASA  BID  Code Status: DNR Family Communication: Sister at bedside, case discussed with nephew over the phone Disposition Plan: SNF w hospice vs residential hospice   Consultants:   Ortho   Antimicrobials:  Vanco x 2 doses   Objective: Vitals:   11/07/16 0324 11/07/16 0600 11/07/16 1030 11/07/16 1403  BP: 109/67 112/65 115/72 101/65  Pulse:  (!) 101 96 88  Resp:  17 16 15   Temp:  98.2 F (36.8 C) 98 F (36.7 C) 98.4 F (36.9 C)  TempSrc:  Axillary Axillary Axillary  SpO2:  99% 96% 94%  Height:        Intake/Output Summary (Last 24 hours) at 11/07/16 1603 Last data filed at 11/07/16 1404  Gross per 24 hour  Intake          2595.83 ml  Output            797.5 ml  Net          1798.33 ml   Filed Weights    Examination:  General exam: Sleepy, Jaundice  HEENT: Scleral icteric 3+ Respiratory system: Clear to auscultation. No wheezes,crackle or rhonchi Cardiovascular system: S1 & S2 heard, RRR. No JVD, murmurs, rubs or gallops Gastrointestinal system: Soft,mild tenderness RUQ, BM + Central nervous system: Confuse and disoriented  Extremities: No LE edema  Skin: No rashes, lesions or ulcers  Data Reviewed: I have personally reviewed following labs and imaging studies  CBC:  Recent Labs Lab 11/06/16 0700 11/07/16 0348  WBC 27.1* 19.7*  NEUTROABS 24.1*  --   HGB 14.2 11.2*  HCT 41.4 34.0*  MCV 84.7 84.8  PLT 210 956   Basic Metabolic Panel:  Recent  Labs Lab 11/06/16 0700 11/07/16 0348  NA 133* 135  K 4.1 4.5  CL 100* 105  CO2 18* 20*  GLUCOSE 139* 175*  BUN 44* 57*  CREATININE 1.72* 1.88*  CALCIUM 13.1* 11.5*    Recent Results (from the past 240 hour(s))  MRSA PCR Screening     Status: None   Collection Time: 10/29/16 11:24 PM  Result Value Ref Range Status   MRSA by PCR NEGATIVE NEGATIVE Final    Comment:        The GeneXpert MRSA Assay (FDA approved for NASAL specimens only), is one component of a comprehensive MRSA  colonization surveillance program. It is not intended to diagnose MRSA infection nor to guide or monitor treatment for MRSA infections.   Surgical pcr screen     Status: None   Collection Time: 11/06/16  6:29 PM  Result Value Ref Range Status   MRSA, PCR NEGATIVE NEGATIVE Final   Staphylococcus aureus NEGATIVE NEGATIVE Final    Comment:        The Xpert SA Assay (FDA approved for NASAL specimens in patients over 19 years of age), is one component of a comprehensive surveillance program.  Test performance has been validated by [facility] for patients greater than or equal to 72 year old. It is not intended to diagnose infection nor to guide or monitor treatment.       Radiology Studies: Dg Chest 2 View  Result Date: 11/06/2016 CLINICAL DATA:  61 year old male with fall. Chest radiograph dated 10/16/2013 EXAM: CHEST  2 VIEW COMPARISON:  None. FINDINGS: There is shallow inspiration. Linear left lung base atelectasis/scarring. There is diffuse vascular prominence with areas of pulmonary nodularity which may represent pulmonary vascular congestion or pneumonia. There is no focal consolidation, pleural effusion, or pneumothorax. The cardiac silhouette is within normal limits. Median sternotomy wires and CABG vascular clips noted. There is tortuosity of the thoracic aorta. Left pectoral AICD device noted. No acute osseous pathology. IMPRESSION: 1. Findings may represent vascular congestion or atypical pneumonia. Clinical correlation is recommended. No focal consolidation. 2. Stable cardiomediastinal silhouette. Electronically Signed   By: Anner Crete M.D.   On: 11/06/2016 06:06   Pelvis Portable  Result Date: 11/06/2016 CLINICAL DATA:  61 year old male status post right replacement. EXAM: PORTABLE PELVIS 1-2 VIEWS COMPARISON:  Abdominal CT dated 10/12/2016 FINDINGS: There is a right hemiarthroplasty which appears intact and in anatomic alignment. There is good contact between the  femoral component of the arthroplasty and adjacent bone. No acute fracture or dislocation identified. The bones are osteopenic. There are postoperative changes with soft tissue gas over the right hip and gluteal region. A drainage catheter is noted with tip medial to the neck of the arthroplasty close to the lesser trochanter. IMPRESSION: Right hip hemiarthroplasty appears intact and in anatomic alignment. No acute fracture or dislocation. Electronically Signed   By: Anner Crete M.D.   On: 11/06/2016 22:53   Ct Femur Right Wo Contrast  Result Date: 11/06/2016 CLINICAL DATA:  Right femoral neck fracture status post fall today. History of metastatic disease. Evaluate for pathologic fracture. EXAM: CT OF THE LOWER RIGHT EXTREMITY WITHOUT CONTRAST TECHNIQUE: Multidetector CT imaging of the right thigh was performed according to the standard protocol. COMPARISON:  Right hip radiographs 11/06/2016. Abdominopelvic CT 10/12/2016 and abdominal CT 10/19/2016. FINDINGS: Bones/Joint/Cartilage Images extend from the hip through the knee. There is a mildly impacted and comminuted subcapital fracture of the right femoral neck. This fracture demonstrates mild anterior and proximal displacement  as well as mild apex anterolateral angulation. No underlying lytic lesion is identified. There are no lytic or blastic lesions elsewhere within the right femur. The femoral head is located. No significant arthropathic changes at the right hip or knee. There is a small right hip joint effusion. Ligaments Not relevant for exam/indication. Muscles and Tendons Unremarkable.  The extensor mechanism appears intact at the knee. Soft tissues No evidence of periarticular hematoma or soft tissue mass. Mild femoral popliteal atherosclerosis. IMPRESSION: 1. The mildly displaced, impacted and angulated subcapital fracture of the right femur demonstrates no pathologic features. 2. No evidence of metastatic disease elsewhere in the right femur or  right thigh soft tissues. Electronically Signed   By: Richardean Sale M.D.   On: 11/06/2016 09:53   Dg Hip Unilat W Or Wo Pelvis 2-3 Views Right  Result Date: 11/06/2016 CLINICAL DATA:  61 year old male with fall and right hip pain. EXAM: DG HIP (WITH OR WITHOUT PELVIS) 2-3V RIGHT COMPARISON:  None. FINDINGS: There is a transverse fracture of the right femoral neck with mild proximal migration and impaction. There is no dislocation. The bones are osteopenic. No other acute fracture identified. Mild arthritic changes of the hips bilaterally. The soft tissues appear unremarkable. IMPRESSION: Mildly impacted fracture of the right femoral neck.  No dislocation. Electronically Signed   By: Anner Crete M.D.   On: 11/06/2016 06:07    Scheduled Meds: . aspirin EC  81 mg Oral BID WC  . buPROPion  150 mg Oral Daily  . dexamethasone  1 mg Oral BID AC  . digoxin  0.0625 mg Oral QODAY  . escitalopram  20 mg Oral Daily  . metoprolol succinate  25 mg Oral Daily  . oxyCODONE  15 mg Oral Q12H  . senna-docusate  2 tablet Oral BID   Continuous Infusions:   LOS: 1 day    Chipper Oman, MD Pager: Text Page via [email or web address]  [phone]  If 7PM-7AM, please contact night-coverage [email or web address] Password [facility] 11/07/2016, 4:03 PM

## 2016-11-07 NOTE — Evaluation (Signed)
Physical Therapy Evaluation Patient Details Name: Logan Burton MRN: [id number] DOB: [birth year] Today's Date: 11/07/2016   History of Present Illness  61 yo male s/p R hip hemi after sustaining fall at home. Per chart, pt was at home with hospice. Hx of CAD, MI, CABG, CHF, CKD, pancreatic cancer with mets.   Clinical Impression  On eval, pt required Max assist +2 for mobility. He sat EOB ~5 minutes with Min-Mod assist for sitting balance. Pt had difficulty remaining alert during session (pre-medicated earlier that a.m). He attempted to participate more but it was not safe to attempt any OOB activity this session. Sister present during session and assisted as well. She was anxious about pt falling again, even with 2 staff members assisting him. Discussed d/c plan with family-plan is for ST rehab at [facility]. Will follow and progress activity as able.     Follow Up Recommendations SNF    Equipment Recommendations   (TBD at next venue)    Recommendations for Other Services       Precautions / Restrictions Precautions Precautions: Fall;Posterior Hip Restrictions Weight Bearing Restrictions: No RLE Weight Bearing: Weight bearing as tolerated      Mobility  Bed Mobility Overal bed mobility: Needs Assistance Bed Mobility: Supine to Sit;Sit to Supine     Supine to sit: Max assist;+2 for physical assistance;+2 for safety/equipment;HOB elevated Sit to supine: Max assist;+2 for physical assistance;+2 for safety/equipment;HOB elevated   General bed mobility comments: Assist for trunk and bil LEs. Utilized bedpad for scooting, positioning. Increased time. Multimoal cueing required. Pt had trouble keeping eyes open longer than a few seconds at a time.   Transfers                 General transfer comment: NT-too drowsy.   Ambulation/Gait                Stairs            Wheelchair Mobility    Modified Rankin (Stroke Patients Only)       Balance Overall balance  assessment: Needs assistance;History of Falls Sitting-balance support: Feet supported;Bilateral upper extremity supported Sitting balance-Leahy Scale: Poor Sitting balance - Comments: Sat EOB ~ 94mintues with varying level of assist: Min-Mod depending on arousal level.  Postural control: Posterior lean                                   Pertinent Vitals/Pain Pain Assessment: Faces Faces Pain Scale: Hurts little more Pain Location: R LE with activity Pain Descriptors / Indicators: Sore Pain Intervention(s): Limited activity within patient's tolerance;Repositioned;Premedicated before session    Home Living Family/patient expects to be discharged to:: Skilled nursing facility Living Arrangements: Alone Available Help at Discharge: Family;Available PRN/intermittently           Home Equipment: None      Prior Function Level of Independence: Independent               Hand Dominance        Extremity/Trunk Assessment   Upper Extremity Assessment Upper Extremity Assessment: Generalized weakness    Lower Extremity Assessment Lower Extremity Assessment: Generalized weakness (s/p R hip hemi)    Cervical / Trunk Assessment Cervical / Trunk Assessment: Normal  Communication   Communication: Prefers language other than English  Cognition Arousal/Alertness: Suspect due to medications (very drowsy) Behavior During Therapy: WFL for tasks assessed/performed Overall Cognitive Status: Difficult to assess  due to level of alertness. Sister reported pt has been confused about situation.                                         General Comments      Exercises     Assessment/Plan    PT Assessment Patient needs continued PT services  PT Problem List Decreased strength;Decreased mobility;Decreased range of motion;Decreased activity tolerance;Decreased balance;Decreased knowledge of use of DME;Pain       PT Treatment Interventions DME  instruction;Therapeutic activities;Therapeutic exercise;Gait training;Patient/family education;Balance training;Functional mobility training    PT Goals (Current goals can be found in the Care Plan section)  Acute Rehab PT Goals Patient Stated Goal: per family, rehab PT Goal Formulation: With family Time For Goal Achievement: 11/21/16 Potential to Achieve Goals: Fair    Frequency Min 3X/week   Barriers to discharge        Co-evaluation               End of Session   Activity Tolerance: Patient limited by fatigue (Limited by drowsiness) Patient left: in bed;with call bell/phone within reach;with family/visitor present;with bed alarm set   PT Visit Diagnosis: Muscle weakness (generalized) (M62.81);Difficulty in walking, not elsewhere classified (R26.2)    Time: 9629-5284 PT Time Calculation (min) (ACUTE ONLY): 15 min   Charges:   PT Evaluation $PT Eval Moderate Complexity: 1 Procedure     PT G Codes:        Weston Anna, MPT Pager: [phone]

## 2016-11-07 NOTE — Progress Notes (Signed)
Pt. Was confused and attempting to get OOB @ shift change, family came out to report 2 times that he was trying to leave. He was given a dose of pain medicine and now he is too groggy to take his evening meds.

## 2016-11-07 NOTE — Progress Notes (Signed)
   Subjective: 1 Day Post-Op Procedure(s) (LRB): ARTHROPLASTY BIPOLAR HIP (HEMIARTHROPLASTY) (Right)  Pt awake and alert  c/o mild pain in the right hip and also c/o he does not like the mittens Denies any new symptoms Patient reports pain as mild.  Objective:   VITALS:   Vitals:   11/07/16 0324 11/07/16 0600  BP: 109/67 112/65  Pulse:  (!) 101  Resp:  17  Temp:  98.2 F (36.8 C)    Right hip incision healing well Drain pulled with some minimal drainage at drain site only nv intact distally  LABS  Recent Labs  11/06/16 0700 11/07/16 0348  HGB 14.2 11.2*  HCT 41.4 34.0*  WBC 27.1* 19.7*  PLT 210 199     Recent Labs  11/06/16 0700 11/07/16 0348  NA 133* 135  K 4.1 4.5  BUN 44* 57*  CREATININE 1.72* 1.88*  GLUCOSE 139* 175*     Assessment/Plan: 1 Day Post-Op Procedure(s) (LRB): ARTHROPLASTY BIPOLAR HIP (HEMIARTHROPLASTY) (Right) PT/OT as able D/c planning to suspected SNF Pain management and continued self protection with mittens as needed    Merla Riches, MPAS, PA-C  11/07/2016, 7:58 AM

## 2016-11-07 NOTE — Progress Notes (Addendum)
[facility]- [facility] G[facility] RN visit at [phone] am.  This is a related, covered GIP admission on 11/06/16, per Dr. Earlie Counts.  Patient was admitted to Hospice with CTI dx:  Pancreatic cancer.  Code status:  DNR.  Patient was brought in by EMS after he had an unwitnessed fall at home.   Patient's sister contacted [facility] and advised she wanted to initiate EMS.  She was given the okay by hospice RN to call EMS to take patient to the hospital.  Visited with patient in his room this morning.   Patient was lethargic and pleasantly disoriented.  Argay, RN, is aware of same.   He advised that patient had hip surgery last night and that he had been having some hallucinations.   Patient is in NAD.  Patient has on bilateral mits to ensure that no lines get pulled out.  Patient complains of 0 pain this morning.   No family at bedside with patient this morning.  Argay, RN,will keep me posted of patient condition throughout the day.  Patient receiving: oxyCODONE (OXYCONTIN) 12 hr tablet 15 mg, Dose 15 mg, Q12H via PO; vancomycin (VANCOCIN) IVPB 1000 mg /200 mL premix, Dose 1000 mg, Q12H via IV.  There are no continuous medications at this time.  Patient has PRN medications available if needed.  Spoke with Dr. Earlie Counts to advised of admission.  Left voicemail on on-call MD line for Dr. Justin Mend.  Medication list and Transfer summary to be placed on shadow chart.  Will continue to follow patient while in the hospital and anticipate any discharge needs.   Thank you,  Edyth Gunnels, RN, [facility] Liaison [phone]  All hospital liaison's are now on [place].

## 2016-11-08 ENCOUNTER — Telehealth: Payer: Self-pay | Admitting: *Deleted

## 2016-11-08 ENCOUNTER — Encounter (HOSPITAL_COMMUNITY): Payer: Self-pay | Admitting: Orthopedic Surgery

## 2016-11-08 LAB — BASIC METABOLIC PANEL
ANION GAP: 9 (ref 5–15)
BUN: 81 mg/dL — ABNORMAL HIGH (ref 6–20)
CHLORIDE: 103 mmol/L (ref 101–111)
CO2: 23 mmol/L (ref 22–32)
Calcium: 10.7 mg/dL — ABNORMAL HIGH (ref 8.9–10.3)
Creatinine, Ser: 2.31 mg/dL — ABNORMAL HIGH (ref 0.61–1.24)
GFR calc Af Amer: 33 mL/min — ABNORMAL LOW (ref >60)
GFR calc non Af Amer: 29 mL/min — ABNORMAL LOW (ref >60)
Glucose, Bld: 135 mg/dL — ABNORMAL HIGH (ref 65–99)
POTASSIUM: 4.8 mmol/L (ref 3.5–5.1)
SODIUM: 135 mmol/L (ref 135–145)

## 2016-11-08 LAB — CBC
HEMATOCRIT: 37.2 % — AB (ref 39.0–52.0)
Hemoglobin: 12.3 g/dL — ABNORMAL LOW (ref 13.0–17.0)
MCH: 28.5 pg (ref 26.0–34.0)
MCHC: 33.1 g/dL (ref 30.0–36.0)
MCV: 86.3 fL (ref 78.0–100.0)
Platelets: 204 10*3/uL (ref 150–400)
RBC: 4.31 MIL/uL (ref 4.22–5.81)
RDW: 21.1 % — ABNORMAL HIGH (ref 11.5–15.5)
WBC: 25.2 10*3/uL — AB (ref 4.0–10.5)

## 2016-11-08 MED ORDER — BOOST / RESOURCE BREEZE PO LIQD
1.0000 | Freq: Three times a day (TID) | ORAL | Status: DC
  Administered 2016-11-08 – 2016-11-09 (×4): 1 via ORAL

## 2016-11-08 MED ORDER — LACTULOSE 10 GM/15ML PO SOLN: 30.0000 g | Freq: Three times a day (TID) | ORAL | Status: DC

## 2016-11-08 MED ORDER — FENTANYL CITRATE (PF) 100 MCG/2ML IJ SOLN
12.5000 ug | INTRAMUSCULAR | Status: DC | PRN
  Administered 2016-11-08: 12.5 ug via INTRAVENOUS
  Filled 2016-11-08: qty 2

## 2016-11-08 MED ORDER — LACTATED RINGERS IV SOLN
INTRAVENOUS | Status: DC
  Administered 2016-11-08 – 2016-11-09 (×2): via INTRAVENOUS

## 2016-11-08 MED ORDER — HALOPERIDOL LACTATE 5 MG/ML IJ SOLN
1.0000 mg | Freq: Four times a day (QID) | INTRAMUSCULAR | Status: DC | PRN
  Administered 2016-11-08 – 2016-11-10 (×4): 1 mg via INTRAVENOUS
  Filled 2016-11-08 (×4): qty 1

## 2016-11-08 NOTE — Progress Notes (Signed)
[facility] MSW note: Patient is a current [facility] patient. Pt is a DNR. This is a related admission. Pt was groggy on visit. No family present. Pt denied pain. Pt was able to acknowledge that he will need more care. MSW met Dr. Edwin Silva-Zapata in the room to discuss case. Pt was not oriented to place. Pt did know his name and the year. MSW discussed option of rehab in skilled nursing facility after hospital discharge. Pt was agreement. MSW educated Dr. Silva-Zapata that [facility] would discharged so medicare would pay for rehab. MSW discussed how Palliative Care can assist pt and family while in rehab. MSW informed that a doctor order would need to written. MSW discussed case with Stacie, RN liaison. MSW spoke with Mary, sister/caregiver by phone during visit to discuss case. Mary-sister stated that she wanted what was best for pt. Brother arrived from [place] yesterday to visit with pt. Another brother form [place] s working on visa issues to come visit pt. Met with Jamie, Hospital SW to discuss case. Please call if questions arise.  ° °Debbie Garner, MSW °[phone] °

## 2016-11-08 NOTE — Progress Notes (Signed)
[facility]- [facility]- GIP RN Visit at [phone] AM  This is a related, covered GIP admission on 11/06/16, per Dr. Earlie Counts.  Patient was admitted to Hospice with CTI dx:  Pancreatic cancer on 11/01/16.  Patient has an [facility] DNR.  Patient was brought in by EMS after he had an unwitnessed fall at home.   Patient's wife contacted [facility] and advised she wanted to initiate EMS.  She was given the okay by hospice RN to call EMS to take patient to the hospital. Patient was admitted for treatment of a right hip fracture.  Visited patient in room, with no family/visitors present at time of visit.  Patient alert and oriented.  He rated his pain to Rt hip a 5/10 and staff RN aware.  It is noted that patient had an incontinent episode of urine and is awaiting CNA to assist with cleaning/linen change.  He is currently wearing mittens to bilateral hands.  He has an untouched breakfast tray at bedside.  Per discussion with staff RN, patient was quite drowsy and confused overnight, possibly from Dilaudid.  He seems more clear this morning.  He was ordered a clear liquid tray this morning to see how he tolerates, then will be advanced as appropriate.  Upon initial assessment, patient does not appear [employer] appropriate.  Per hospice chart review, it is noted that patient does want to remain at home for EOL care if possible.  Patient received 2 doses of 1 mg Dilaudid the past 24 hours for pain.  He is receiving scheduled 15 mg Oxycodone 12 hr tablets Q 12 hours.   Will continue to follow patient while in the hospital and anticipate any discharge needs.  Please feel free to contact with any hospice-related questions or concerns.   Thank you,  Freddi Starr, RN, [facility] Liaison [phone]  All hospital liaison's are now on [place].

## 2016-11-08 NOTE — Progress Notes (Signed)
   Subjective:  RN reports patient received meds for agitation.  Objective:   VITALS:   Vitals:   11/07/16 1812 11/07/16 2024 11/08/16 0653 11/08/16 1348  BP: 115/71 102/69 109/73 99/69  Pulse: 90 87 86 79  Resp: 16 16 16 18   Temp: 98.3 F (36.8 C) 98 F (36.7 C) 98.2 F (36.8 C) 98 F (36.7 C)  TempSrc: Oral Oral Axillary Axillary  SpO2: 96% 93% 100% 95%  Height:        NAD, somnolent ABD soft Sensation intact distally Incision: dressing C/D/I Compartment soft Unable to test motor/sensory due to MS   Lab Results  Component Value Date   WBC 25.2 (H) 11/08/2016   HGB 12.3 (L) 11/08/2016   HCT 37.2 (L) 11/08/2016   MCV 86.3 11/08/2016   PLT 204 11/08/2016   BMET    Component Value Date/Time   NA 135 11/08/2016 0646   NA 133 (L) 10/29/2016 1320   K 4.8 11/08/2016 0646   K 4.7 10/29/2016 1320   CL 103 11/08/2016 0646   CO2 23 11/08/2016 0646   CO2 23 10/29/2016 1320   GLUCOSE 135 (H) 11/08/2016 0646   GLUCOSE 165 (H) 10/29/2016 1320   BUN 81 (H) 11/08/2016 0646   BUN 22.1 10/29/2016 1320   CREATININE 2.31 (H) 11/08/2016 0646   CREATININE 2.1 (H) 10/29/2016 1320   CALCIUM 10.7 (H) 11/08/2016 0646   CALCIUM 11.6 (H) 10/29/2016 1320   GFRNONAA 29 (L) 11/08/2016 0646   GFRAA 33 (L) 11/08/2016 0646     Assessment/Plan: 2 Days Post-Op   Active Problems:   Essential hypertension   Coronary artery disease   Chronic systolic CHF (congestive heart failure) (HCC)   Chronic renal insufficiency   Dyslipidemia   COPD GOLD 0   Ischemic cardiomyopathy   Chronotropic incompetence with sinus node dysfunction ? Iatrogenic   Obstructive jaundice   Hip fracture (HCC)   Closed displaced fracture of right femoral neck (HCC)    WBAT with walker Posterior hip precautions DVT ppx: ASA 81 mg PO BID due to elevated INR from liver dz / risk of bleeding, SCDs, TEDs PT/OT D/C planning    Nola Botkins, Horald Pollen 11/08/2016, 4:28 PM   Rod Can, MD Cell [phone]

## 2016-11-08 NOTE — Anesthesia Postprocedure Evaluation (Signed)
Anesthesia Post Note  Patient: Logan Burton  Procedure(s) Performed: Procedure(s) (LRB): ARTHROPLASTY BIPOLAR HIP (HEMIARTHROPLASTY) (Right)  Patient location during evaluation: PACU Anesthesia Type: General Level of consciousness: sedated Pain management: pain level controlled Vital Signs Assessment: post-procedure vital signs reviewed and stable Respiratory status: spontaneous breathing, respiratory function stable and patient connected to nasal cannula oxygen Cardiovascular status: blood pressure returned to baseline and stable Postop Assessment: no headache and no backache Anesthetic complications: no       Last Vitals:  Vitals:   11/07/16 2024 11/08/16 0653  BP: 102/69 109/73  Pulse: 87 86  Resp: 16 16  Temp: 36.7 C 36.8 C    Last Pain:  Vitals:   11/08/16 0653  TempSrc: Axillary  PainSc:                  Latriece Anstine S

## 2016-11-08 NOTE — Progress Notes (Signed)
OT Cancellation Note  Patient Details Name: Logan Burton MRN: [id number] DOB: Dec 23, 1955   Cancelled Treatment:    Reason Eval/Treat Not Completed: Other (comment)Noted needed 2 person A with PT and plan is SNF/Hospice. Will defer OT eval to SNF [facility], [place]  Betsy Pries 11/08/2016, 10:32 AM

## 2016-11-08 NOTE — Progress Notes (Signed)
[facility] note: Patient is a current [facility] patient and this is a related admission. Patient was sleeping with four family members present. Patient's sister - [name] - visited privately with chaplain in waiting room. Mary and other family express feeling overwhelmed and scattered. [name] discussed how she wants what is best for patient, but "doesn't know what to do" now. Mary discussed patient's estate planning and how patient is now very confused. Mary discussed patient's desire for body donation after end-of-life, or cremation if he is not eligible for body donation. Chaplain will ask [facility] MSW to follow up regarding body donation. [name] discussed how this is unusual for [place] culture, which favors unembalmed burial, and how family is uncertain. Patient is affiliated with the [employer] [place] and received priest visit yesterday for communion. [name] feels patient is spiritually at peace and realistic concerning end-of-life, but independent and does not like accepting help. Chaplain encouraged [name] to discuss patient's wishes openly with family in order to reach a decision together. Chaplain encouraged [name] to spend valuable time with patient while waiting for counsel from medical team. Chaplain provided active listening with validation of feelings and spiritual counsel. [facility] team will continue to follow. Please call with questions/concerns.   [place], [place], [place]

## 2016-11-08 NOTE — Telephone Encounter (Signed)
Message from pt's sister asking for pathology results and plan of care. Noted pt has had hip surgery. Dr. Benay Spice made aware of admission.  Returned call to [facility], she reports they have been given information about possible nursing home placement vs [facility] or discharge home with hospice. She would like to discuss things with Dr. Benay Spice. Also requests MD call her son who is a physician who will help her to make any decisions. Informed her that Dr. Benay Spice will likely see him during rounds on 3/27. Logan Burton voiced understanding.

## 2016-11-08 NOTE — Progress Notes (Signed)
PROGRESS NOTE [facility]   Logan Burton   ZHG:[id number] DOB: 1955-08-17  DOA: 11/06/2016 PCP: Jonathon Bellows, MD   Brief Narrative:  61 year old male with medical history significant for CAD with CABG in the past chronic CHF chronic kidney disease stage III, recently diagnosed with pancreatic cancer with metastasis to the liver with obstructive jaundice currently on hospice presented to the emergency room after fall. Patient was admitted with right femoral displaced fracture. Status post right hip hemiarthroplasty. Patient now with acute metabolic encephalopathy   Subjective: Patient seen and examined this morning. Patient alert, slight disoriented. Patient waxing and waning from lucid to confused periods. Patient report no pain and have no concern. Per family member patient is does not want to eat. This has been going since when he was discharged from prior admission. Family concern about him been restless and agitated.    Assessment & Plan: Right hip fracture s/p R hip hemiarthroplasty  Carefull with over medicating patient susceptible to sundowning/Delirium.  Continue pain management as needed keep patient comfortable.  Will add low dose haldol for PRN agitation  I had a length discussion with his family including sister, brother and nephew. Their main goal is keeping patient comfortable and pain free. At this point patient will not be able to participate on rehab program, and if does will defeat the purpose of comfort measures as physical therapy after rehabilitation will be a great toll him.   Obstructive jaundice due to Pancreatic Ca - very poor prognosis  Patient with liver metastasis, he is currently on hospice, he looks significantly icteric on exam. Bili 17.9 He was evaluated by GI last time he was hospitalized, they considered an ERCP versus IR intervention, however patient did not wish for any aggressive measures. Given his poor prognosis, now encephalopathic, lack of  nutrition, and now s/p surgery w/ poor mobility, I don't expect this patient to have a recover. He will be a candidate for inpatient hospice facility and comfort measure.  Acute hepatic encephalopathy - due to liver failure. Ammonia level 68  Started on Lactulose 30 mg BID will increase to TID  Monitor Ammonia level in AM   Essential hypertension - BP soft  Hold home medication for now   Chronic systolic CHF (congestive heart failure) - no signs of fluid overload AICD has been deactivated by [employer] tech when he was discharged last week Will continue to monitor   Acute on Chronic kidney disease stage III - likely due to poor oral intake  Cr up to 2.31  Will give some gentle hydration LR 75 mg Monitor BMP in AM   COPD -Stable  Depression -Continue Wellbutrin  DVT prophylaxis: ASA BID  Code Status: DNR Family Communication: Sister at bedside, case discussed with nephew over the phone Disposition Plan: Family would like patient to be comfortable, which he would not be in rehab, therefore recommendation is residential hospice.   Consultants:   Ortho   Antimicrobials:  Vanco x 2 doses   Objective: Vitals:   11/07/16 1812 11/07/16 2024 11/08/16 0653 11/08/16 1348  BP: 115/71 102/69 109/73 99/69  Pulse: 90 87 86 79  Resp: 16 16 16 18   Temp: 98.3 F (36.8 C) 98 F (36.7 C) 98.2 F (36.8 C) 98 F (36.7 C)  TempSrc: Oral Oral Axillary Axillary  SpO2: 96% 93% 100% 95%  Height:        Intake/Output Summary (Last 24 hours) at 11/08/16 1857 Last data filed at 11/08/16 1652  Gross per  24 hour  Intake           673.75 ml  Output              770 ml  Net           -96.25 ml   Filed Weights    Examination:  General exam: Confuse, coffee over his gown   HEENT: Scleral icterus 3+  Respiratory system: CTA no wheezing Cardiovascular system: S1S2 no murmurs Gastrointestinal system: Soft, slight distended, non tenderness Central nervous system: Oriented to  person and year  Extremities: No edema   Data Reviewed: I have personally reviewed following labs and imaging studies  CBC:  Recent Labs Lab 11/06/16 0700 11/07/16 0348 11/08/16 0646  WBC 27.1* 19.7* 25.2*  NEUTROABS 24.1*  --   --   HGB 14.2 11.2* 12.3*  HCT 41.4 34.0* 37.2*  MCV 84.7 84.8 86.3  PLT 210 199 614   Basic Metabolic Panel:  Recent Labs Lab 11/06/16 0700 11/07/16 0348 11/08/16 0646  NA 133* 135 135  K 4.1 4.5 4.8  CL 100* 105 103  CO2 18* 20* 23  GLUCOSE 139* 175* 135*  BUN 44* 57* 81*  CREATININE 1.72* 1.88* 2.31*  CALCIUM 13.1* 11.5* 10.7*    Recent Results (from the past 240 hour(s))  MRSA PCR Screening     Status: None   Collection Time: 10/29/16 11:24 PM  Result Value Ref Range Status   MRSA by PCR NEGATIVE NEGATIVE Final    Comment:        The GeneXpert MRSA Assay (FDA approved for NASAL specimens only), is one component of a comprehensive MRSA colonization surveillance program. It is not intended to diagnose MRSA infection nor to guide or monitor treatment for MRSA infections.   Surgical pcr screen     Status: None   Collection Time: 11/06/16  6:29 PM  Result Value Ref Range Status   MRSA, PCR NEGATIVE NEGATIVE Final   Staphylococcus aureus NEGATIVE NEGATIVE Final    Comment:        The Xpert SA Assay (FDA approved for NASAL specimens in patients over 77 years of age), is one component of a comprehensive surveillance program.  Test performance has been validated by [facility] for patients greater than or equal to 76 year old. It is not intended to diagnose infection nor to guide or monitor treatment.       Radiology Studies: Pelvis Portable  Result Date: 11/06/2016 CLINICAL DATA:  61 year old male status post right replacement. EXAM: PORTABLE PELVIS 1-2 VIEWS COMPARISON:  Abdominal CT dated 10/12/2016 FINDINGS: There is a right hemiarthroplasty which appears intact and in anatomic alignment. There is good contact  between the femoral component of the arthroplasty and adjacent bone. No acute fracture or dislocation identified. The bones are osteopenic. There are postoperative changes with soft tissue gas over the right hip and gluteal region. A drainage catheter is noted with tip medial to the neck of the arthroplasty close to the lesser trochanter. IMPRESSION: Right hip hemiarthroplasty appears intact and in anatomic alignment. No acute fracture or dislocation. Electronically Signed   By: Anner Crete M.D.   On: 11/06/2016 22:53    Scheduled Meds: . aspirin EC  81 mg Oral BID WC  . buPROPion  150 mg Oral Daily  . dexamethasone  1 mg Oral BID AC  . digoxin  0.0625 mg Oral QODAY  . escitalopram  20 mg Oral Daily  . feeding supplement  1 Container Oral TID  BM  . lactulose  30 g Oral BID  . metoprolol succinate  25 mg Oral Daily  . oxyCODONE  15 mg Oral Q12H  . senna-docusate  2 tablet Oral BID   Continuous Infusions: . lactated ringers 75 mL/hr at 11/08/16 1033     LOS: 2 days    Chipper Oman, MD Pager: Text Page via [email or web address]  [phone]  If 7PM-7AM, please contact night-coverage [email or web address] Password [facility] 11/08/2016, 6:57 PM

## 2016-11-08 NOTE — Progress Notes (Addendum)
Initial Nutrition Assessment  DOCUMENTATION CODES:   Not applicable  INTERVENTION:   Boost Breeze po TID, each supplement provides 250 kcal and 9 grams of protein  NUTRITION DIAGNOSIS:   Increased nutrient needs related to cancer and cancer related treatments, other (see comment) (hip fracture ) as evidenced by increased estimated needs from energy and protein.  GOAL:   Patient will meet greater than or equal to 90% of their needs  MONITOR:   PO intake, Supplement acceptance, Labs, Weight trends  REASON FOR ASSESSMENT:   Malnutrition Screening Tool    ASSESSMENT:   61 year old male with medical history significant for CAD with CABG in the past chronic CHF chronic kidney disease stage III, recently diagnosed with pancreatic cancer with metastasis to the liver with obstructive jaundice currently on hospice presented to the emergency room after fall. Patient was admitted with right femoral displaced fracture. Status post right hip hemiarthroplasty.   Met with pt and family in room today. Unable to communicate with pt as pt is very agitated today. Spoke to pt's wife who reports that pt has not been eating well for several months now. Wife reports that pt will eat a few bites of food here and there but that he gets full really easy r/t his tumor pressing on his stomach. Pt is able to get pt to drink juice. Pt is eating 75% of his clear liquid diet when wife encourages intake. RD spoke to wife about offering Boost Breeze in the place of the juice that pt has been drinking. RD discussed the importance on adequate protein intake for pt's cancer and hip fracture. There is no new weight on this pt since admit. Pt does not appear to have significant weight loss. Continue to encourage oral intake or meals and supplements. Pt currently with home hospice; will be discharging to SNF when medically ready.    Medications reviewed and include: aspirin, dexamethasone, lactulose, oxycodone,  senokot  Labs reviewed: BUN 81(H), creat 2.31(H), Ca 10.7(H), ammonia 68(H) Wbc- 25.2(H)  Nutrition-Focused physical exam completed. Findings are no fat depletion, mild muscle depletion in clavicles, and no edema. Pt is severely icteric over entire body.   Diet Order:  Diet clear liquid Room service appropriate? Yes; Fluid consistency: Thin  Skin:  Wound (see comment) (hip incision )  Last BM:  3/24  Height:   Ht Readings from Last 1 Encounters:  11/06/16 6' 2" (1.88 m)    Weight:   Wt Readings from Last 1 Encounters:  10/29/16 181 lb (82.1 kg)    Ideal Body Weight:  86.3 kg  BMI:  There is no height or weight on file to calculate BMI.  Estimated Nutritional Needs:   Kcal:  2500-2800kcal/day   Protein:  123-140g/day   Fluid:  >2.5L/day   EDUCATION NEEDS:   No education needs identified at this time  Koleen Distance, RD, LDN Pager #[phone]

## 2016-11-09 DIAGNOSIS — R4182 Altered mental status, unspecified: Secondary | ICD-10-CM

## 2016-11-09 DIAGNOSIS — C787 Secondary malignant neoplasm of liver and intrahepatic bile duct: Secondary | ICD-10-CM

## 2016-11-09 DIAGNOSIS — Z7189 Other specified counseling: Secondary | ICD-10-CM

## 2016-11-09 DIAGNOSIS — K7290 Hepatic failure, unspecified without coma: Secondary | ICD-10-CM

## 2016-11-09 DIAGNOSIS — C801 Malignant (primary) neoplasm, unspecified: Secondary | ICD-10-CM

## 2016-11-09 DIAGNOSIS — Z9181 History of falling: Secondary | ICD-10-CM

## 2016-11-09 LAB — CBC
HCT: 35.4 % — ABNORMAL LOW (ref 39.0–52.0)
Hemoglobin: 11.6 g/dL — ABNORMAL LOW (ref 13.0–17.0)
MCH: 28.5 pg (ref 26.0–34.0)
MCHC: 32.8 g/dL (ref 30.0–36.0)
MCV: 87 fL (ref 78.0–100.0)
Platelets: 209 10*3/uL (ref 150–400)
RBC: 4.07 MIL/uL — ABNORMAL LOW (ref 4.22–5.81)
RDW: 21.7 % — ABNORMAL HIGH (ref 11.5–15.5)
WBC: 25.4 10*3/uL — ABNORMAL HIGH (ref 4.0–10.5)

## 2016-11-09 LAB — COMPREHENSIVE METABOLIC PANEL
ALK PHOS: 637 U/L — AB (ref 38–126)
ALT: 154 U/L — AB (ref 17–63)
AST: 184 U/L — ABNORMAL HIGH (ref 15–41)
Albumin: 2.1 g/dL — ABNORMAL LOW (ref 3.5–5.0)
Anion gap: 10 (ref 5–15)
BUN: 75 mg/dL — ABNORMAL HIGH (ref 6–20)
CALCIUM: 10.7 mg/dL — AB (ref 8.9–10.3)
CO2: 22 mmol/L (ref 22–32)
CREATININE: 2.21 mg/dL — AB (ref 0.61–1.24)
Chloride: 102 mmol/L (ref 101–111)
GFR, EST AFRICAN AMERICAN: 35 mL/min — AB (ref >60)
GFR, EST NON AFRICAN AMERICAN: 30 mL/min — AB (ref >60)
Glucose, Bld: 121 mg/dL — ABNORMAL HIGH (ref 65–99)
Potassium: 4.1 mmol/L (ref 3.5–5.1)
Sodium: 134 mmol/L — ABNORMAL LOW (ref 135–145)
Total Bilirubin: 42.3 mg/dL (ref 0.3–1.2)
Total Protein: 6.2 g/dL — ABNORMAL LOW (ref 6.5–8.1)

## 2016-11-09 LAB — AMMONIA: AMMONIA: 40 umol/L — AB (ref 9–35)

## 2016-11-09 MED ORDER — FENTANYL CITRATE (PF) 100 MCG/2ML IJ SOLN
25.0000 ug | INTRAMUSCULAR | Status: DC | PRN
  Administered 2016-11-09 – 2016-11-10 (×3): 25 ug via INTRAVENOUS
  Filled 2016-11-09 (×3): qty 2

## 2016-11-09 NOTE — Clinical Social Work Note (Signed)
Clinical Social Work Assessment  Patient Details  Name: Logan Burton MRN: [id number] Date of Birth: Aug 21, 1955  Date of referral:  11/09/16               Reason for consult:  Facility Placement, Discharge Planning, End of Life/Hospice                Permission sought to share information with:  Chartered certified accountant granted to share information::  Yes, Verbal Permission Granted  Name::        Agency::     Relationship::     Contact Information:     Housing/Transportation Living arrangements for the past 2 months:  Single Family Home Source of Information:  Other (Comment Required) (sister) Patient Interpreter Needed:  None Criminal Activity/Legal Involvement Pertinent to Current Situation/Hospitalization:  No - Comment as needed Significant Relationships:  Siblings, Other Family Members Lives with:  Self Do you feel safe going back to the place where you live?   (Residential Hospice Home recommended.) Need for family participation in patient care:  Yes (Comment)  Care giving concerns:  Family would like to focus on comfort care.    Social Worker assessment / plan:  Pt hospitalized from home on 11/06/16 with hip fx. Surgery completed. Pt has a dx of liver mets with obstructive jaundice.  CSW consulted to assist with residential hospice home placement. Pt is alert and oriented  x1. CSW met with pt's sister, Logan Burton this afternoon. She is in agreement with plan for residential hospice home. Logan Burton is aware that [facility] does not have an opening at this time. She will consider Hospice Home at [facility] if there are no openings tomorrow. CSW will contact Logan Burton in am to continuing assisting with d/c planning.  Employment status:  Disabled (Comment on whether or not currently receiving Disability) Insurance information:  Managed Medicare PT Recommendations:  No Follow Up Information / Referral to community resources:  Other (Comment Required) (Residential hospice home  info.)  Patient/Family's Response to care:  Family is in agreement with residential hospice home placement.  Patient/Family's Understanding of and Emotional Response to Diagnosis, Current Treatment, and Prognosis: Family is aware of pt's medical status and prognosis. CSW provided pt's sister the opportunity to verbalize her feelings / frustrations relating to caring for her brother while dealing with all family members. " I love my brother and have tried to do everything he has asked of me. This has been a 7 day nightmare. "  Logan Burton has CSW cell and will contact CSW as needed. Ongoing support will be provided.   Emotional Assessment Appearance:  Appears stated age Attitude/Demeanor/Rapport:  Other (restless, uncooperative at times) Affect (typically observed):  Agitated Orientation:  Oriented to Self Alcohol / Substance use:  Not Applicable Psych involvement (Current and /or in the community):  No (Comment)  Discharge Needs  Concerns to be addressed:  Discharge Planning Concerns Readmission within the last 30 days:  Yes Current discharge risk:  None Barriers to Discharge:  No Barriers Identified   Logan Burton, [place] 11/09/2016, 1:51 PM

## 2016-11-09 NOTE — Progress Notes (Signed)
[facility] MSW note: Met with sister-Mary as patient was being moved to room 1328. Pt appeared weaker than yesterday's visit. Pt did not respond verbally. Pt appeared comfortable. MSW discussed hospital discharge plans with sister. Sister does agree for pt to be transferred to be [facility] when a bed is available. No  [facility] bed available today. Sister is open to considering looking at [facility] home if [facility] does not have bed at hospital discharge. MSW explained to sister that hospice transfer papers would need to be signed prior to pt moving to [place]. MSW educated sister on room and board charges at [facility]. Sister aware of pt's poor condition. MSW offered support and active listening.   Marilynne Halsted, MSW [phone]

## 2016-11-09 NOTE — Progress Notes (Signed)
11/10/14 0030 Nursing Patient turned q 2 hours to clean and check for incontinence. Patient refusing to turn side to side. Wants to lay on his back. Heels off bed with pillows

## 2016-11-09 NOTE — Progress Notes (Signed)
IP PROGRESS NOTE  Subjective:   Logan Burton is known to me with a recent diagnosis of metastatic carcinoma involving the liver. He was admitted 10/29/2016 for evaluation of obstructive jaundice. He was seen in consultation by gastroenterology and was not a candidate for an ERCP procedure here. He declined transfer trait tertiary care center to consider relief of the biliary obstruction and elected comfort care.  He was discharged 10/31/2016 with Hospice care. He was readmitted 11/06/2016 after a fall and suffered a right hip fracture. He underwent a right hip hemiarthroplasty on 11/06/2016.  Logan Burton remains hospitalized. His family contacted me yesterday requesting an update on his condition.  The family is not present this morning.    Objective: Vital signs in last 24 hours: Blood pressure 94/60, pulse 83, temperature 98.1 F (36.7 C), temperature source Axillary, resp. rate 16, height 6\' 2"  (1.88 m), SpO2 94 %.  Intake/Output from previous day: 03/26 0701 - 03/27 0700 In: 1776.3 [P.O.:280; I.V.:1496.3] Out: 1070 [Urine:1070]  Physical Exam:    Abdomen:  distended, the liver is palpable in the upper abdomen Skin: Jaundice neurologic: Arousable, confused, not following commands, appears delirious   Lab Results:  Recent Labs  11/08/16 0646 11/09/16 0750  WBC 25.2* 25.4*  HGB 12.3* 11.6*  HCT 37.2* 35.4*  PLT 204 209    BMET  Recent Labs  11/08/16 0646 11/09/16 0750  NA 135 134*  K 4.8 4.1  CL 103 102  CO2 23 22  GLUCOSE 135* 121*  BUN 81* 75*  CREATININE 2.31* 2.21*  CALCIUM 10.7* 10.7*  Ammonia 40, bilirubin 42.3, calcium 10.7, albumin 2.1   Medications: I have reviewed the patient's current medications.  Assessment/Plan: 1. Metastatic carcinomaresenting with abdominal/back pain  CT 10/12/2016 with pancreas head fullness, possible liver/adrenal metastases, and abdominal lymphadenopathy  10/15/2016 CA-19-9 elevated at 272; CEA mildly elevated  8.34  Upper EUS 10/19/2016-sonographic changes consistent with moderate-severe chronic pancreatitis. Minimally enlarged lymph nodes visualized in the gastrohepatic ligament, porta hepatis region, aortocaval region and upper abdominal periaortic region. Multiple round discrete lesions noted in the left lobe and visualized right lobe of the liver. Fine-needle aspiration performed.   pathology confirmed poorly differentiated carcinoma  2. History of coronary artery disease  3. Congestiveeart failure  4. Arrhythmia-defibrillator in place  5. Chronic renal failure  6. Atrophic right kidney  7. BPH  8. Peripheral vascular disease, left renal artery stent in place  9.     Hypercalcemia of malignancy  10.   Altered mental status  11.  Fall with right hip fracture 11/06/2016-status post right hip hemiarthroplasty 11/06/2016   Logan Burton is admitted after a right hip fracture. He was recently diagnosed with poorly differentiated carcinoma involving a liver biopsy. 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 He elected comfort care/hospice when he was admitted earlier this month.  He appears delirious this morning. This is likely secondary to hepatic encephalopathy, hypercalcemia, and hospital delirium. I was asked to contact his nephew, a physician in [place] to discuss the prognosis. I recommend comfort care/hospice. He appears to be a candidate for [facility]. I will contact his nephew.  I would not treat the hypercalcemia unless the patient and family elected for treatment beyond comfort care.  Recommendations: 1. Consult the [facility] program to consider transfer to [facility] 2. Consolidate medical regimen to comfort care only 3. I will contact his nephew  Please call  Oncology as needed.  Addendum: I spoke with his nephew Dr. Sabino Gasser by  telephone. We reviewed the history and prognosis. He reports the family is in agreement with comfort care and desires transfer to [facility]. He appears to have a prognosis of days to a few weeks. I agree with the plan for a [facility] transfer.   LOS: 3 days   Betsy Coder, MD   11/09/2016, 2:02 PM

## 2016-11-09 NOTE — Progress Notes (Signed)
[facility]- [facility] RN visit at 0900 am.  This is a related, covered GIP admission on 11/06/16, per Dr. Earlie Counts.  Patient was admitted to Hospice with CTI dx:  Pancreatic cancer.  Code status:  DNR.  Patient was brought in by EMS after he had an unwitnessed fall at home.   Patient's wife contacted [facility] and advised she wanted to initiate EMS.  She was given the okay by hospice RN to call EMS to take patient to the hospital.  Visited with patient in his room this morning. No family in room but patient now has a bedside one on one sitter. Patient continues to be jaundiced, is slightly restless and mumbling with complains of slight pain this morning yet unable to describe; sitter states "RN is getting something for pain".  Bedside RN states patient was agitated/combative on evening shift and medicated with Haldol last night, and that the plan is to seek comfort care at this time.  PT and OT services have been discontinued at this time.  Spoke with SW [place] Cerise about placement at [facility] which has no bed availability this morning; will keep her updated on availability today.  [facility] SW [place] Cerise made aware and will reach out to support family.    Patient receiving: oxyCODONE (OXYCONTIN) 12 hr tablet 15 mg, Dose 15 mg, Q12H via PO; Lactated Ringer continuous IV infusion at 58ml/hr; PRN Morphine and Dilaudid has been discontinued.  In last 12 hours, PRN medications Pt has received are Haldol 1mg  IV at 2339 and Tylenol 650  tablet at 0905 this am.    Will continue to follow patient while in the hospital and anticipate any discharge needs.   Thank you,  Gar Ponto, RN [facility] Liaison  [facility] Liaisons schedule and contact information can now be found on Amion

## 2016-11-09 NOTE — Progress Notes (Signed)
CSW consulted to assist with d/c planning. PN reviewed. Spoke with Attending, Judeen Hammans, RN and Jackelyn Poling, MSW from [facility]. CSW will meet with pt's sister, [name] and brother this afternoon to assist with residential hospice home placement.   Werner Lean LCSW [phone]

## 2016-11-09 NOTE — Progress Notes (Signed)
PROGRESS NOTE [facility]   Logan Burton   RXV:[id number] DOB: 1955-10-17  DOA: 11/06/2016 PCP: Jonathon Bellows, MD   Brief Narrative:  61 year old male with medical history significant for CAD with CABG in the past chronic CHF chronic kidney disease stage III, recently diagnosed with pancreatic cancer with metastasis to the liver with obstructive jaundice currently on hospice presented to the emergency room after fall. Patient was admitted with right femoral displaced fracture. Status post right hip hemiarthroplasty. Patient now with acute metabolic encephalopathy   Subjective: [name] continues to decline. Very confuse and restless, per nursing staff responded well to haldol last night. Patient voices no pain at the time of my exam.   I have discussed with his nephew,patient status and prognosis, family are in agreement of keeping patient comfortable as this was patient wishes when he elected comfort care/hospice when admitted early this morning. [place] would like to proceed with inpatient hospice care. Patient would be full comfort at this point.   Assessment & Plan: Right hip fracture s/p R hip hemiarthroplasty 11/06/16 D/c PO/OT as patient is full comfort  Continue pain management as needed keep patient comfortable.  Continue haldol PRN for agitation.   Obstructive jaundice due to Pancreatic Ca - very poor prognosis  Patient with liver metastasis, he is currently on hospice, he looks significantly icteric on exam. Bili 42.3 He was evaluated by GI last time he was hospitalized, they considered an ERCP versus IR intervention, however patient did not wish for any aggressive measures. Ammonia 40 - will d/c Lactulose Comfort measures only   Acute hepatic encephalopathy - due to liver failure. Ammonia level 68 -> 40  Started on Lactulose 30 mg BID will increase to TID - will d/c Lactulose No more lab draw   Essential hypertension - BP soft  Holding home medication for now    Chronic systolic CHF (congestive heart failure) - no signs of fluid overload AICD has been deactivated by [employer] tech when he was discharged last week Will continue to monitor   Acute on Chronic kidney disease stage III - likely due to poor oral intake  Cr up to 2.31 -> 2.21 On gentle hydration LR 75 mg  COPD -Stable  Depression -Continue Wellbutrin  Goal of care - Discussed goal of care family made decision of full comfort measures - patient will be transfer to [facility] place when bed available   DVT prophylaxis: ASA BID  Code Status: DNR Family Communication: Case discussed with nephew over the phone Disposition Plan: Residential hospice  Consultants:   Ortho   Antimicrobials:  Vanco x 2 doses   Objective: Vitals:   11/09/16 0403 11/09/16 0904 11/09/16 0910 11/09/16 1334  BP: 119/72  112/68 94/60  Pulse: 94 93 93 83  Resp: 18  16 16   Temp: 98.1 F (36.7 C)  98.8 F (37.1 C) 98.1 F (36.7 C)  TempSrc: Axillary  Axillary Axillary  SpO2: 99%  95% 94%  Height:        Intake/Output Summary (Last 24 hours) at 11/09/16 1555 Last data filed at 11/09/16 1300  Gross per 24 hour  Intake          1736.25 ml  Output             1100 ml  Net           636.25 ml   Filed Weights    Examination:  General exam: Lethargic, but responding to commands  HEENT: Scleral icterus 3+  Respiratory system: CTA  Cardiovascular system: RRR Gastrointestinal system: Soft  Central nervous system: Disoriented  Extremities: No edema   Data Reviewed: I have personally reviewed following labs and imaging studies  CBC:  Recent Labs Lab 11/06/16 0700 11/07/16 0348 11/08/16 0646 11/09/16 0750  WBC 27.1* 19.7* 25.2* 25.4*  NEUTROABS 24.1*  --   --   --   HGB 14.2 11.2* 12.3* 11.6*  HCT 41.4 34.0* 37.2* 35.4*  MCV 84.7 84.8 86.3 87.0  PLT 210 199 204 557   Basic Metabolic Panel:  Recent Labs Lab 11/06/16 0700 11/07/16 0348 11/08/16 0646 11/09/16 0750  NA  133* 135 135 134*  K 4.1 4.5 4.8 4.1  CL 100* 105 103 102  CO2 18* 20* 23 22  GLUCOSE 139* 175* 135* 121*  BUN 44* 57* 81* 75*  CREATININE 1.72* 1.88* 2.31* 2.21*  CALCIUM 13.1* 11.5* 10.7* 10.7*    Recent Results (from the past 240 hour(s))  Surgical pcr screen     Status: None   Collection Time: 11/06/16  6:29 PM  Result Value Ref Range Status   MRSA, PCR NEGATIVE NEGATIVE Final   Staphylococcus aureus NEGATIVE NEGATIVE Final    Comment:        The Xpert SA Assay (FDA approved for NASAL specimens in patients over 9 years of age), is one component of a comprehensive surveillance program.  Test performance has been validated by [facility] for patients greater than or equal to 75 year old. It is not intended to diagnose infection nor to guide or monitor treatment.      Radiology Studies: No results found.  Scheduled Meds: . aspirin EC  81 mg Oral BID WC  . buPROPion  150 mg Oral Daily  . dexamethasone  1 mg Oral BID AC  . digoxin  0.0625 mg Oral QODAY  . escitalopram  20 mg Oral Daily  . feeding supplement  1 Container Oral TID BM  . lactulose  30 g Oral TID  . oxyCODONE  15 mg Oral Q12H  . senna-docusate  2 tablet Oral BID   Continuous Infusions: . lactated ringers 75 mL/hr at 11/09/16 0501     LOS: 3 days    Time spent 40 minutes coordinating care for this patient   Chipper Oman, MD Pager: Text Page via [email or web address]  [phone]  If 7PM-7AM, please contact night-coverage [email or web address] Password [facility] 11/09/2016, 3:55 PM

## 2016-11-09 NOTE — Progress Notes (Signed)
Physical Therapy Discharge Patient Details Name: BACH ROCCHI MRN: [id number] DOB: 1956/03/05 Today's Date: 11/09/2016 Time:  -     Patient discharged from PT services secondary to medical decline - Notes reviewed. Comfort care measures/ residential Hospice is being considered.   Please see latest therapy progress note for current level of functioning and progress toward goals.      GP     Marcelino Freestone PT [phone]  11/09/2016, 6:54 AM

## 2016-11-10 DIAGNOSIS — S72001D Fracture of unspecified part of neck of right femur, subsequent encounter for closed fracture with routine healing: Secondary | ICD-10-CM

## 2016-11-10 DIAGNOSIS — S72001A Fracture of unspecified part of neck of right femur, initial encounter for closed fracture: Secondary | ICD-10-CM

## 2016-11-10 DIAGNOSIS — K838 Other specified diseases of biliary tract: Secondary | ICD-10-CM

## 2016-11-10 MED ORDER — ACETAMINOPHEN 325 MG PO TABS: 650.0000 mg | ORAL_TABLET | Freq: Four times a day (QID) | ORAL | Status: AC | PRN

## 2016-11-10 NOTE — Progress Notes (Signed)
[facility] Liaison:  RN   Received request from [place] for family interest in [facility] with request for transfer today.  Chart reviewed and patient eligible.  Family agreeable to transfer today.  Debbie, [facility] SW, did paperwork with family yesterday.  CSW aware.  Dr. Orpah Melter to assume care per family request.  Please fax discharge summary to [phone].  RN please call report to [phone].  Please arrange transport for patient to arrive before noon, if possible.    Thank you,  Edyth Gunnels, RN, [facility] Liaison [phone]  All hospital liaison's are now on [place].

## 2016-11-10 NOTE — Progress Notes (Signed)
Pt ready to bed/c to [facility] today. Family is in agreement with this plan. EMS transport arranged and medical necessity form completed. D/C Summary sent to facility for review. Number for report provided to nsg.  Werner Lean LCSW [phone]

## 2016-11-10 NOTE — Progress Notes (Signed)
PROGRESS NOTE [facility]   Logan Burton   [id number]:[id number] DOB: [birth year]  DOA: 11/06/2016 PCP: Jonathon Bellows, MD   Brief Narrative:  61 year old male with medical history significant for CAD with CABG in the past chronic CHF chronic kidney disease stage III, recently diagnosed with pancreatic cancer with metastasis to the liver with obstructive jaundice currently on hospice presented to the emergency room after fall. Patient was admitted with right femoral displaced fracture. Status post right hip hemiarthroplasty. Patient now with acute metabolic encephalopathy   Subjective: No acute events overnight. He appears calm this morning.  Family member at the bedside.   Assessment & Plan: Right hip fracture s/p R hip hemiarthroplasty 11/06/16 Continue pain management as needed keep patient comfortable.  Continue haldol PRN for agitation.   Obstructive jaundice due to Pancreatic Ca - very poor prognosis  Comfort measures only   Acute hepatic encephalopathy - due to liver failure. Comfort care only.   Essential hypertension - BP soft  Holding home medication for now   Chronic systolic CHF (congestive heart failure) - no signs of fluid overload AICD has been deactivated by [employer] tech when he was discharged last week Will continue to monitor   Acute on Chronic kidney disease stage III - likely due to poor oral intake  Comfort care   COPD -Stable  Depression   Goal of care - Discussed goal of care family made decision of full comfort measures - patient will be transfer to [facility] place when bed available   DVT prophylaxis: ASA BID  Code Status: DNR Family Communication: family member at the bedside.  Disposition Plan: Discharge to [facility] place for hospice care.   Consultants:   Ortho   Antimicrobials:  Vanco x 2 doses   Objective: Vitals:   11/09/16 0910 11/09/16 1334 11/09/16 2033 11/10/16 0609  BP: 112/68 94/60 116/88 111/71  Pulse: 93 83 84 91    Resp: 16 16 17 18   Temp: 98.8 F (37.1 C) 98.1 F (36.7 C) 97.8 F (36.6 C) 98.4 F (36.9 C)  TempSrc: Axillary Axillary Axillary Axillary  SpO2: 95% 94% 97% 93%  Height:        Intake/Output Summary (Last 24 hours) at 11/10/16 7341 Last data filed at 11/10/16 9379  Gross per 24 hour  Intake             1185 ml  Output             1075 ml  Net              110 ml   Filed Weights    Examination:  General exam: Lethargic HEENT: Scleral icterus 3+  Respiratory system: CTA  Cardiovascular system: RRR Gastrointestinal system: Soft  Central nervous system: Disoriented  Extremities: No edema   Data Reviewed: I have personally reviewed following labs and imaging studies  CBC:  Recent Labs Lab 11/06/16 0700 11/07/16 0348 11/08/16 0646 11/09/16 0750  WBC 27.1* 19.7* 25.2* 25.4*  NEUTROABS 24.1*  --   --   --   HGB 14.2 11.2* 12.3* 11.6*  HCT 41.4 34.0* 37.2* 35.4*  MCV 84.7 84.8 86.3 87.0  PLT 210 199 204 024   Basic Metabolic Panel:  Recent Labs Lab 11/06/16 0700 11/07/16 0348 11/08/16 0646 11/09/16 0750  NA 133* 135 135 134*  K 4.1 4.5 4.8 4.1  CL 100* 105 103 102  CO2 18* 20* 23 22  GLUCOSE 139* 175* 135* 121*  BUN 44* 57* 81* 75*  CREATININE 1.72* 1.88* 2.31* 2.21*  CALCIUM 13.1* 11.5* 10.7* 10.7*    Recent Results (from the past 240 hour(s))  Surgical pcr screen     Status: None   Collection Time: 11/06/16  6:29 PM  Result Value Ref Range Status   MRSA, PCR NEGATIVE NEGATIVE Final   Staphylococcus aureus NEGATIVE NEGATIVE Final    Comment:        The Xpert SA Assay (FDA approved for NASAL specimens in patients over 37 years of age), is one component of a comprehensive surveillance program.  Test performance has been validated by [facility] for patients greater than or equal to 74 year old. It is not intended to diagnose infection nor to guide or monitor treatment.      Radiology Studies: No results found.  Scheduled Meds: .  buPROPion  150 mg Oral Daily  . dexamethasone  1 mg Oral BID AC  . digoxin  0.0625 mg Oral QODAY  . escitalopram  20 mg Oral Daily  . feeding supplement  1 Container Oral TID BM  . oxyCODONE  15 mg Oral Q12H  . senna-docusate  2 tablet Oral BID   Continuous Infusions: . lactated ringers 75 mL/hr at 11/09/16 0501     LOS: 4 days    Time spent 40 minutes coordinating care for this patient   Logan Renne Arsenio Loader, MD Pager: Text Page via [email or web address]  [phone]  If 7PM-7AM, please contact night-coverage [email or web address] Password [email or web address] 11/10/2016, 9:29 AM

## 2016-11-10 NOTE — Discharge Summary (Signed)
Physician Discharge Summary  Logan Burton NLZ:[id number] DOB: 12-03-1955 DOA: 11/06/2016  PCP: Jonathon Bellows, MD  Admit date: 11/06/2016 Discharge date: 11/10/2016  Admitted From: Home Disposition:  Hospice, [facility]   Recommendations for Outpatient Follow-up:  1. Discharge to hospice care,comfort care.   Home Health: No Equipment/Devices: None  Discharge Condition: Hospice  CODE STATUS: Comfort care  Diet recommendation: As tolerated   Brief/Interim Summary:  61 y.o. male with medical history significant of coronary artery disease history of MI and CABG in the past, chronic systolic CHF, chronic kidney disease stage III, recently diagnosed pancreatic cancer with metastasis to the liver with obstructive jaundice, currently on hospice, presents to the emergency room with fall. CT scan of the femur showed mildly displaced, impacted and angulated subcapital fx of the right femur. Orthopedic was consulted and he underwent right hip hemiarthroplasty. Pain control was initiated.  Today is stable to go to [facility] with comfort care.   Discharge Diagnoses:  Active Problems:   Essential hypertension   Coronary artery disease   Chronic systolic CHF (congestive heart failure) (HCC)   Chronic renal insufficiency   Dyslipidemia   COPD GOLD 0   Ischemic cardiomyopathy   Chronotropic incompetence with sinus node dysfunction ? Iatrogenic   Obstructive jaundice   Hip fracture (HCC)   Closed displaced fracture of right femoral neck (HCC)  Right hip fracture s/p R hip hemiarthroplasty 11/06/16 Continue pain management as needed keep patient comfortable.  Continue haldol PRN for agitation.   Obstructive jaundice due to Pancreatic Ca - very poor prognosis  Comfort measures only   Acute hepatic encephalopathy - due to liver failure. Comfort care only.   Essential hypertension Holding medication   Chronic systolic CHF (congestive heart failure) - no signs of fluid overload AICD has  been deactivated by [employer] tech when he was discharged last week Will continue to monitor   Acute on Chronic kidney disease stage III - likely due to poor oral intake  Comfort care   COPD -Stable  Depression   Discharge Instructions   Allergies as of 11/10/2016      Reactions   Tuberculin Tests    Pt states he had tb vaccine in his country. He states that if he now has a tb skin test for tuberculosis - it will always be positive. Pt states it is dangerous to have the skin tb tests.      Medication List    STOP taking these medications   aspirin 81 MG tablet   buPROPion 150 MG 12 hr tablet Commonly known as:  WELLBUTRIN SR   dexamethasone 1 MG tablet Commonly known as:  DECADRON   digoxin 0.125 MG tablet Commonly known as:  LANOXIN   escitalopram 20 MG tablet Commonly known as:  LEXAPRO   ezetimibe 10 MG tablet Commonly known as:  ZETIA   hydrALAZINE 25 MG tablet Commonly known as:  APRESOLINE   metoprolol succinate 25 MG 24 hr tablet Commonly known as:  TOPROL-XL   ondansetron 4 MG disintegrating tablet Commonly known as:  ZOFRAN-ODT   oxyCODONE 15 mg 12 hr tablet Commonly known as:  OXYCONTIN   oxyCODONE 5 MG immediate release tablet Commonly known as:  Oxy IR/ROXICODONE   polyethylene glycol packet Commonly known as:  MIRALAX / GLYCOLAX   senna-docusate 8.6-50 MG tablet Commonly known as:  Senokot-S     TAKE these medications   acetaminophen 325 MG tablet Commonly known as:  TYLENOL Take 2 tablets (650 mg  total) by mouth every 6 (six) hours as needed for mild pain (or Fever >/= 101).   glycopyrrolate 1 MG tablet Commonly known as:  ROBINUL Take 1 tablet (1 mg total) by mouth every 4 (four) hours as needed (excessive secretions).   HYDROmorphone 4 MG tablet Commonly known as:  DILAUDID Take 4 mg by mouth 2 (two) times daily.      Follow-up Information    Swinteck, Horald Pollen, MD. Schedule an appointment as soon as possible for a  visit in 2 week(s).   Specialty:  Orthopedic Surgery Why:  For wound re-check Contact information: [address] [place] [address] [phone]          Allergies  Allergen Reactions  . Tuberculin Tests     Pt states he had tb vaccine in his country. He states that if he now has a tb skin test for tuberculosis - it will always be positive. Pt states it is dangerous to have the skin tb tests.    Consultations: Orthopedic  Oncology - Dr Benay Spice  Procedures/Studies: Ct Abdomen Pelvis Wo Contrast  Result Date: 10/12/2016 CLINICAL DATA:  Left lower quadrant abdominal pain for 2 weeks. EXAM: CT ABDOMEN AND PELVIS WITHOUT CONTRAST TECHNIQUE: Multidetector CT imaging of the abdomen and pelvis was performed following the standard protocol without IV contrast. COMPARISON:  None. FINDINGS: Lower chest: Multiple small nodules are noted in the visualized lung bases, with the largest measuring 6 mm. Hepatobiliary: Gallbladder appears to be contracted, without evidence of gallstones. Nearly 3 cm ill-defined low density is noted inferiorly in right hepatic lobe concerning for possible metastatic lesion. Pancreas: There appears to be pancreatic head enlargement concerning for pancreatic neoplasm or malignancy, although pancreatitis cannot be excluded. Spleen: Normal in size without focal abnormality. Adrenals/Urinary Tract: 2 cm right adrenal nodule is noted. Severe right renal atrophy is noted. Left kidney and ureter are unremarkable. Urinary bladder is decompressed. Stomach/Bowel: There is no evidence of bowel obstruction. Vascular/Lymphatic: Atherosclerosis of abdominal aorta is noted with 5.6 cm aneurysm noted in suprarenal abdominal aorta. Extensive adenopathy is noted in the superior retroperitoneal region as well as probable porta hepatis region concerning for metastatic disease. Reproductive: Mild prostatic enlargement is noted. Other: Mild inflammatory stranding is noted in  mesenteric region. Musculoskeletal: No acute or significant osseous findings. IMPRESSION: Pancreatic head enlargement is noted concerning for pancreatic neoplasm or malignancy, although pancreatitis cannot be excluded. Further evaluation with MRI is recommended. Retroperitoneal and porta hepatis adenopathy is noted concerning for metastatic disease. 3 cm low density is noted in right hepatic lobe concerning for possible metastatic lesion. Further evaluation with MRI is recommended. 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 Vascular surgery consultation recommended due to increased risk of rupture for AAA >5.5 cm. This recommendation follows ACR consensus guidelines: White Paper of the ACR Incidental Findings Committee II on Vascular Findings. J Am Coll Radiol 2013; 10:789-794. Multiple pulmonary nodules are noted in visualized lung bases concerning for possible metastatic disease given the above findings. These results will be called to the ordering clinician or representative by the Radiologist Assistant, and communication documented in the PACS or zVision Dashboard. Electronically Signed   By: Marijo Conception, M.D.   On: 10/12/2016 15:30   Ct Abdomen Wo Contrast  Result Date: 10/29/2016 CLINICAL DATA:  Hyperbilirubinemia. EXAM: CT ABDOMEN WITHOUT CONTRAST TECHNIQUE: Multidetector CT imaging of the abdomen was performed following the standard protocol without IV contrast. COMPARISON:  10/12/2016. FINDINGS: Lower chest: There  are multiple pulmonary nodules identified within both lung bases. Compared with previous exam these are increased in number suspicious for metastatic disease. Hepatobiliary: The liver has an irregular contour compatible with cirrhosis. New intrahepatic bile duct dilatation. Mass within segment 5 of the liver measures 5.5 cm, image 34 of series 2. Previously 4.8 cm. There is diffuse gallbladder wall thickening. Pancreas: Exam detail is diminished due to lack of IV and oral  contrast material. Mass in the region of the porta hepatis and head of pancreas is again noted. Difficult to assess due to limitations mentioned above. This measures approximately 4.3 x 4.5 cm, image 25 of series 2. Head, body and tail of pancreas appear normal. Spleen: Normal in size without focal abnormality. Adrenals/Urinary Tract: Similar appearance of right adrenal nodule measuring 1.6 cm, image 21 of series 2. Normal appearance of the left adrenal gland. Unremarkable appearance of the left kidney. The right kidney appears atrophic. Stomach/Bowel: Stomach is within normal limits. No pathologic dilatation of the visualized upper abdominal bowel loops. Vascular/Lymphatic: Aortic atherosclerosis. Infrarenal abdominal aortic aneurysm measures 3.5 cm, image 41 of series 2. Retroperitoneal adenopathy is again identified. Index periaortic node measures 1.8 cm, image 33 of series 2. Previously 1.4 cm. Lymph node adjacent to the superior mesenteric artery measures 1.9 cm, image 27 of series 2. Previously 1.6 cm. Enlarged retrocaval node measures 1.5 cm, image 33 of series 2. Previously 1.7 cm. Other: No ascites.  No focal fluid collections. Musculoskeletal: No aggressive lytic or sclerotic bone lesions. IMPRESSION: 1. Interval development of intrahepatic bile duct dilatation. This likely reflects mass effect due to lesion in the region of the head of pancreas/porta hepatis. There is also wall thickening involving the gallbladder which may reflect obstruction of the cystic duct. 2. Right lobe of liver metastases is again noted and appears slightly increased in size from previous exam. 3. Metastatic adenopathy noted within the upper abdomen. 4. Aortic atherosclerosis 5. Infrarenal abdominal aortic aneurysm. Recommend followup by ultrasound in 2 years. This recommendation follows ACR consensus guidelines: White Paper of the ACR Incidental Findings Committee II on Vascular Findings. J Am Coll Radiol 2013; 10:789-794.  Electronically Signed   By: Kerby Moors M.D.   On: 10/29/2016 13:55   Dg Chest 2 View  Result Date: 11/06/2016 CLINICAL DATA:  61 year old male with fall. Chest radiograph dated 10/16/2013 EXAM: CHEST  2 VIEW COMPARISON:  None. FINDINGS: There is shallow inspiration. Linear left lung base atelectasis/scarring. There is diffuse vascular prominence with areas of pulmonary nodularity which may represent pulmonary vascular congestion or pneumonia. There is no focal consolidation, pleural effusion, or pneumothorax. The cardiac silhouette is within normal limits. Median sternotomy wires and CABG vascular clips noted. There is tortuosity of the thoracic aorta. Left pectoral AICD device noted. No acute osseous pathology. IMPRESSION: 1. Findings may represent vascular congestion or atypical pneumonia. Clinical correlation is recommended. No focal consolidation. 2. Stable cardiomediastinal silhouette. Electronically Signed   By: Anner Crete M.D.   On: 11/06/2016 06:06   US Abdomen Complete  Result Date: 10/27/2016 CLINICAL DATA:  Known hepatic lesion and likely pancreatic enlargement with jaundice EXAM: ABDOMEN ULTRASOUND COMPLETE COMPARISON:  10/12/2016 FINDINGS: Gallbladder: Echogenic material is noted within although no definitive gallstones are seen. This likely represents tumefactive sludge. Mild gallbladder wall thickening is noted similar to that seen on prior CT examination Common bile duct: Diameter: 5.9 mm. Liver: Multiple hypoechoic lesions are identified throughout the liver. Largest of these corresponds to that seen on recent CT examination measuring  approximately 5.8 cm in greatest length. The multiplicity of lesions with was not well appreciated on prior CT due to the lack of IV contrast. IVC: Not well visualized Pancreas: Not well visualized Spleen: Size and appearance within normal limits. Right Kidney: Length: 5.9 cm. Diffuse atrophy is noted similar to that seen on recent CT examination.  Left Kidney: Length: 14.4 cm. Echogenicity within normal limits. No mass or hydronephrosis visualized. Abdominal aorta: No aneurysm visualized. Other findings: None. IMPRESSION: Multiple hepatic lesions consistent with metastatic disease. The overall appearance is worse than that seen on the prior exam although the degree of visualization was limited on the prior study due to the lack of IV contrast. Tissue sampling is recommended for further evaluation. Electronically Signed   By: Inez Catalina M.D.   On: 10/27/2016 09:45   Pelvis Portable  Result Date: 11/06/2016 CLINICAL DATA:  61 year old male status post right replacement. EXAM: PORTABLE PELVIS 1-2 VIEWS COMPARISON:  Abdominal CT dated 10/12/2016 FINDINGS: There is a right hemiarthroplasty which appears intact and in anatomic alignment. There is good contact between the femoral component of the arthroplasty and adjacent bone. No acute fracture or dislocation identified. The bones are osteopenic. There are postoperative changes with soft tissue gas over the right hip and gluteal region. A drainage catheter is noted with tip medial to the neck of the arthroplasty close to the lesser trochanter. IMPRESSION: Right hip hemiarthroplasty appears intact and in anatomic alignment. No acute fracture or dislocation. Electronically Signed   By: Anner Crete M.D.   On: 11/06/2016 22:53   Ct Femur Right Wo Contrast  Result Date: 11/06/2016 CLINICAL DATA:  Right femoral neck fracture status post fall today. History of metastatic disease. Evaluate for pathologic fracture. EXAM: CT OF THE LOWER RIGHT EXTREMITY WITHOUT CONTRAST TECHNIQUE: Multidetector CT imaging of the right thigh was performed according to the standard protocol. COMPARISON:  Right hip radiographs 11/06/2016. Abdominopelvic CT 10/12/2016 and abdominal CT 10/19/2016. FINDINGS: Bones/Joint/Cartilage Images extend from the hip through the knee. There is a mildly impacted and comminuted subcapital  fracture of the right femoral neck. This fracture demonstrates mild anterior and proximal displacement as well as mild apex anterolateral angulation. No underlying lytic lesion is identified. There are no lytic or blastic lesions elsewhere within the right femur. The femoral head is located. No significant arthropathic changes at the right hip or knee. There is a small right hip joint effusion. Ligaments Not relevant for exam/indication. Muscles and Tendons Unremarkable.  The extensor mechanism appears intact at the knee. Soft tissues No evidence of periarticular hematoma or soft tissue mass. Mild femoral popliteal atherosclerosis. IMPRESSION: 1. The mildly displaced, impacted and angulated subcapital fracture of the right femur demonstrates no pathologic features. 2. No evidence of metastatic disease elsewhere in the right femur or right thigh soft tissues. Electronically Signed   By: Richardean Sale M.D.   On: 11/06/2016 09:53   Dg Hip Unilat W Or Wo Pelvis 2-3 Views Right  Result Date: 11/06/2016 CLINICAL DATA:  61 year old male with fall and right hip pain. EXAM: DG HIP (WITH OR WITHOUT PELVIS) 2-3V RIGHT COMPARISON:  None. FINDINGS: There is a transverse fracture of the right femoral neck with mild proximal migration and impaction. There is no dislocation. The bones are osteopenic. No other acute fracture identified. Mild arthritic changes of the hips bilaterally. The soft tissues appear unremarkable. IMPRESSION: Mildly impacted fracture of the right femoral neck.  No dislocation. Electronically Signed   By: Laren Everts.D.  On: 11/06/2016 06:07       Subjective:   Discharge Exam: Vitals:   11/09/16 2033 11/10/16 0609  BP: 116/88 111/71  Pulse: 84 91  Resp: 17 18  Temp: 97.8 F (36.6 C) 98.4 F (36.9 C)   Vitals:   11/09/16 0910 11/09/16 1334 11/09/16 2033 11/10/16 0609  BP: 112/68 94/60 116/88 111/71  Pulse: 93 83 84 91  Resp: 16 16 17 18   Temp: 98.8 F (37.1 C) 98.1 F  (36.7 C) 97.8 F (36.6 C) 98.4 F (36.9 C)  TempSrc: Axillary Axillary Axillary Axillary  SpO2: 95% 94% 97% 93%  Height:        General: Pt is alert, awake, not in acute distress Cardiovascular: RRR, S1/S2 +, no rubs, no gallops Respiratory: CTA bilaterally, no wheezing, no rhonchi Abdominal: Soft, NT, ND, bowel sounds + Extremities: no edema, no cyanosis    The results of significant diagnostics from this hospitalization (including imaging, microbiology, ancillary and laboratory) are listed below for reference.     Microbiology: Recent Results (from the past 240 hour(s))  Surgical pcr screen     Status: None   Collection Time: 11/06/16  6:29 PM  Result Value Ref Range Status   MRSA, PCR NEGATIVE NEGATIVE Final   Staphylococcus aureus NEGATIVE NEGATIVE Final    Comment:        The Xpert SA Assay (FDA approved for NASAL specimens in patients over 38 years of age), is one component of a comprehensive surveillance program.  Test performance has been validated by [facility] for patients greater than or equal to 12 year old. It is not intended to diagnose infection nor to guide or monitor treatment.      Labs: BNP (last 3 results)  Recent Labs  06/17/16 1558  BNP 95.1   Basic Metabolic Panel:  Recent Labs Lab 11/06/16 0700 11/07/16 0348 11/08/16 0646 11/09/16 0750  NA 133* 135 135 134*  K 4.1 4.5 4.8 4.1  CL 100* 105 103 102  CO2 18* 20* 23 22  GLUCOSE 139* 175* 135* 121*  BUN 44* 57* 81* 75*  CREATININE 1.72* 1.88* 2.31* 2.21*  CALCIUM 13.1* 11.5* 10.7* 10.7*   Liver Function Tests:  Recent Labs Lab 11/09/16 0750  AST 184*  ALT 154*  ALKPHOS 637*  BILITOT 42.3*  PROT 6.2*  ALBUMIN 2.1*   No results for input(s): LIPASE, AMYLASE in the last 168 hours.  Recent Labs Lab 11/07/16 1709 11/09/16 0750  AMMONIA 68* 40*   CBC:  Recent Labs Lab 11/06/16 0700 11/07/16 0348 11/08/16 0646 11/09/16 0750  WBC 27.1* 19.7* 25.2* 25.4*   NEUTROABS 24.1*  --   --   --   HGB 14.2 11.2* 12.3* 11.6*  HCT 41.4 34.0* 37.2* 35.4*  MCV 84.7 84.8 86.3 87.0  PLT 210 199 204 209   Cardiac Enzymes: No results for input(s): CKTOTAL, CKMB, CKMBINDEX, TROPONINI in the last 168 hours. BNP: Invalid input(s): POCBNP CBG: No results for input(s): GLUCAP in the last 168 hours. D-Dimer No results for input(s): DDIMER in the last 72 hours. Hgb A1c No results for input(s): HGBA1C in the last 72 hours. Lipid Profile No results for input(s): CHOL, HDL, LDLCALC, TRIG, CHOLHDL, LDLDIRECT in the last 72 hours. Thyroid function studies No results for input(s): TSH, T4TOTAL, T3FREE, THYROIDAB in the last 72 hours.  Invalid input(s): FREET3 Anemia work up No results for input(s): VITAMINB12, FOLATE, FERRITIN, TIBC, IRON, RETICCTPCT in the last 72 hours. Urinalysis No results found for:  COLORURINE, APPEARANCEUR, [place], [place], GLUCOSEU, [place], BILIRUBINUR, [place], PROTEINUR, UROBILINOGEN, NITRITE, LEUKOCYTESUR Sepsis Labs Invalid input(s): PROCALCITONIN,  WBC,  LACTICIDVEN Microbiology Recent Results (from the past 240 hour(s))  Surgical pcr screen     Status: None   Collection Time: 11/06/16  6:29 PM  Result Value Ref Range Status   MRSA, PCR NEGATIVE NEGATIVE Final   Staphylococcus aureus NEGATIVE NEGATIVE Final    Comment:        The Xpert SA Assay (FDA approved for NASAL specimens in patients over 101 years of age), is one component of a comprehensive surveillance program.  Test performance has been validated by [facility] for patients greater than or equal to 29 year old. It is not intended to diagnose infection nor to guide or monitor treatment.      Time coordinating discharge: Over 30 minutes  SIGNED:   Damita Lack, MD  [facility] 11/10/2016, 9:32 AM Pager   If 7PM-7AM, please contact night-coverage [email or web address] Password [email or web address]

## 2016-11-14 DEATH — deceased

## 2016-11-24 ENCOUNTER — Telehealth: Payer: Self-pay | Admitting: Cardiology

## 2016-11-24 ENCOUNTER — Encounter: Payer: PPO | Admitting: *Deleted

## 2016-11-24 NOTE — Telephone Encounter (Signed)
LMOVM reminding pt to send remote transmission.   

## 2016-12-20 ENCOUNTER — Telehealth: Payer: Self-pay

## 2016-12-20 NOTE — Telephone Encounter (Signed)
Sister [name] called explaining that the family is discussing how the pt died looking at scans and labs. They are asking what is the underlying cause of death. The death certificate says pancreatic cancer but they are thinking it is another cancer or more likely something else. They are not looking to change the death certificate but are concerned if there is any genetic possibilities.   She would like to know [facility] biopsy results.   Please call sister [name] on her cell [phone]

## 2016-12-27 ENCOUNTER — Telehealth: Payer: Self-pay | Admitting: *Deleted

## 2016-12-27 NOTE — Telephone Encounter (Signed)
Call placed to patient's sister, Logan Burton to inform her per order of Dr. 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  Appointment with genetics counselor offered to [facility] per order of Dr. Benay Spice as well.  Logan Burton would like to think about appt with genetics counselor and get back with [place].  She would also like a copy of the [employer] report mailed to her at [address] [place], [place] [address].  [facility] appreciative of call and information from Dr. Benay Spice and has no further questions at this time.

## 2016-12-28 ENCOUNTER — Encounter (HOSPITAL_COMMUNITY): Payer: Self-pay | Admitting: *Deleted

## 2017-01-17 NOTE — Addendum Note (Signed)
Addendum  created 01/17/17 1240 by Myrtie Soman, MD   Sign clinical note

## 2017-01-17 NOTE — Anesthesia Postprocedure Evaluation (Signed)
Anesthesia Post Note  Patient: Logan Burton  Procedure(s) Performed: Procedure(s) (LRB): ARTHROPLASTY BIPOLAR HIP (HEMIARTHROPLASTY) (Right)     Anesthesia Post Evaluation  Last Vitals:  Vitals:   11/09/16 2033 11/10/16 0609  BP: 116/88 111/71  Pulse: 84 91  Resp: 17 18  Temp: 36.6 C 36.9 C    Last Pain:  Vitals:   11/10/16 1020  TempSrc:   PainSc: Asleep                 Aminat Shelburne S

## 2018-08-09 IMAGING — CT CT ABD-PELV W/O CM
2 of 4 series · 10 of 36 positions shown, 17 images · non-contrast
Comparison: None.

CLINICAL DATA: Left lower quadrant abdominal pain for 2 weeks.

EXAM:
CT ABDOMEN AND PELVIS WITHOUT CONTRAST
TECHNIQUE: Multidetector CT imaging of the abdomen and pelvis was performed
following the standard protocol without IV contrast.

[Series 3: abd/pelvis w/o · axial · non-contrast · 0.75mm/px · z∈[-388,-12]mm · 9 of 95 slices shown, 15 images]
[im 10/95  soft-tissue]
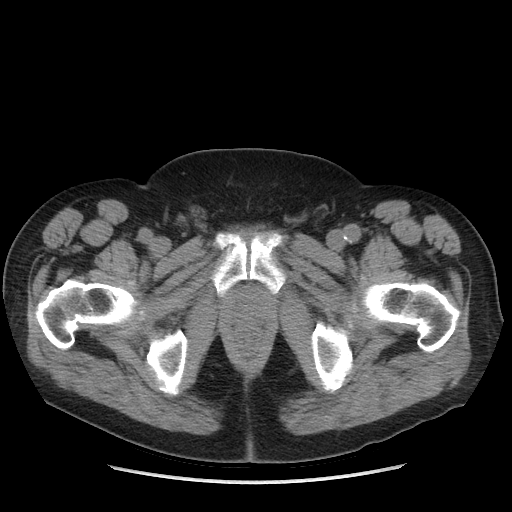
[im 10/95  bone]
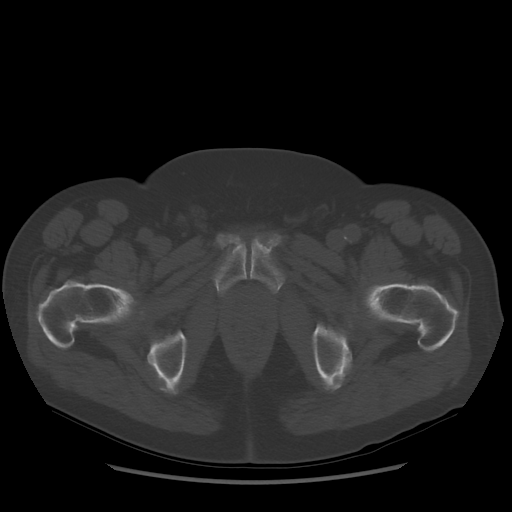
[im 19/95  soft-tissue]
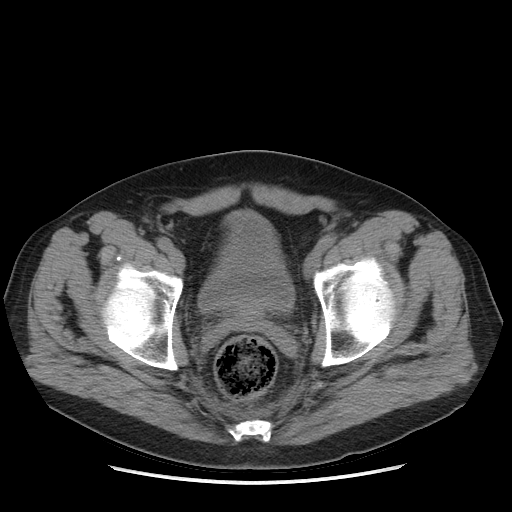
[im 29/95  soft-tissue]
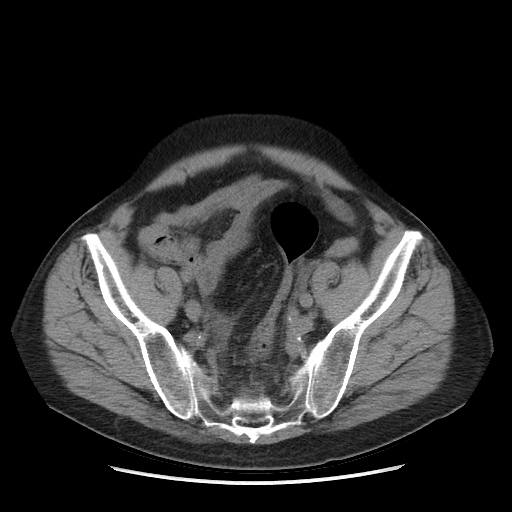
[im 38/95  soft-tissue]
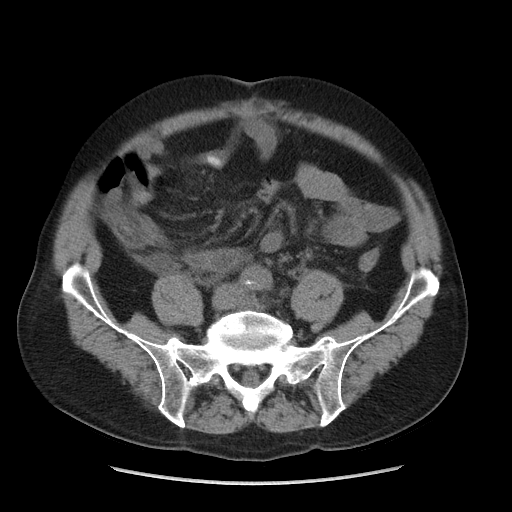
[im 48/95  soft-tissue]
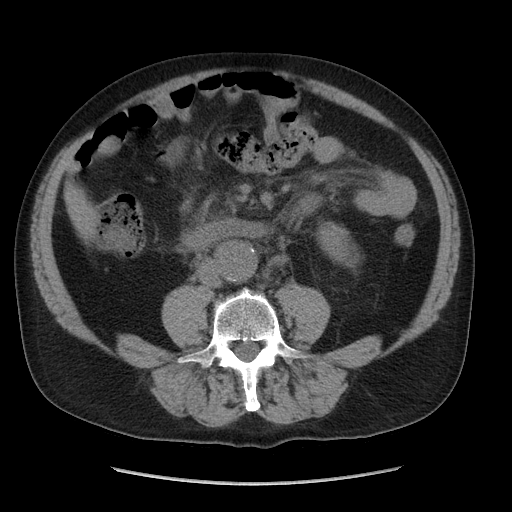
[im 57/95  soft-tissue]
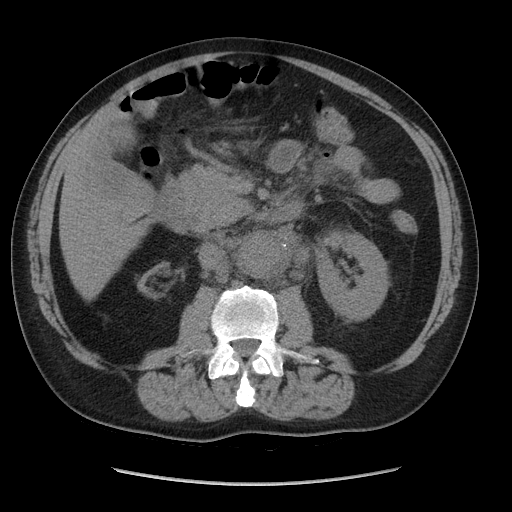
[im 57/95  lung]
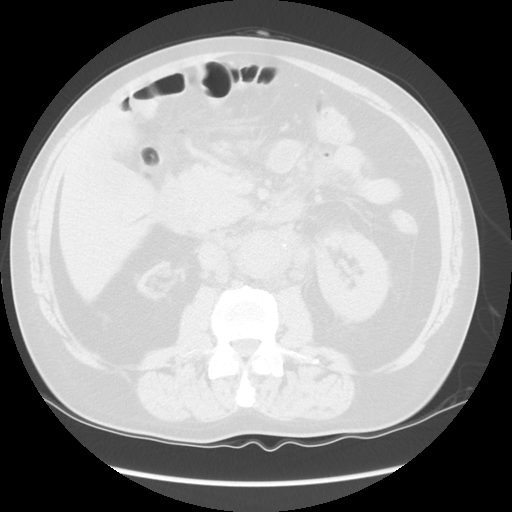
[im 66/95  soft-tissue]
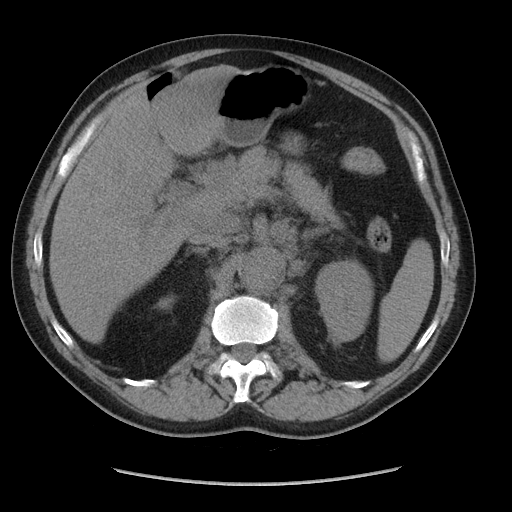
[im 66/95  lung]
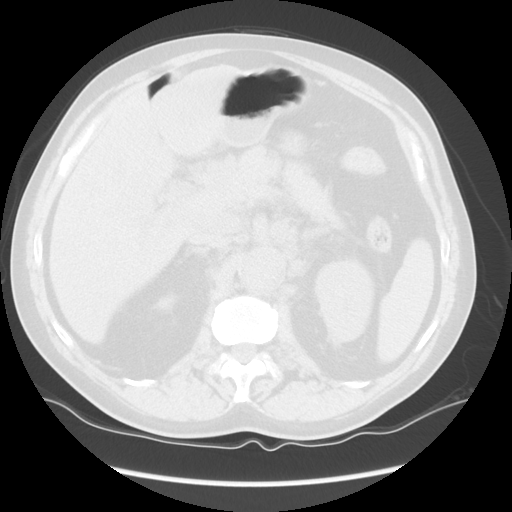
[im 76/95  soft-tissue]
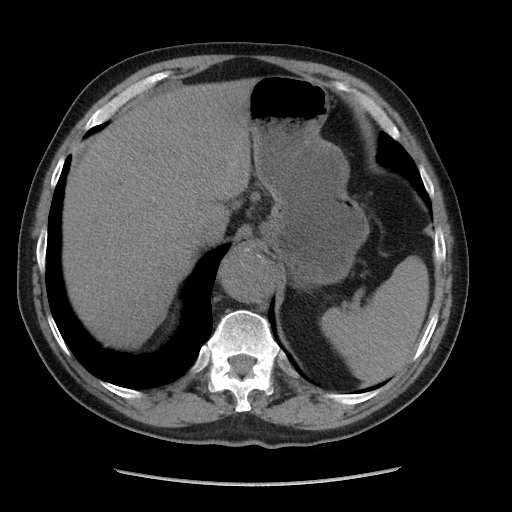
[im 76/95  lung]
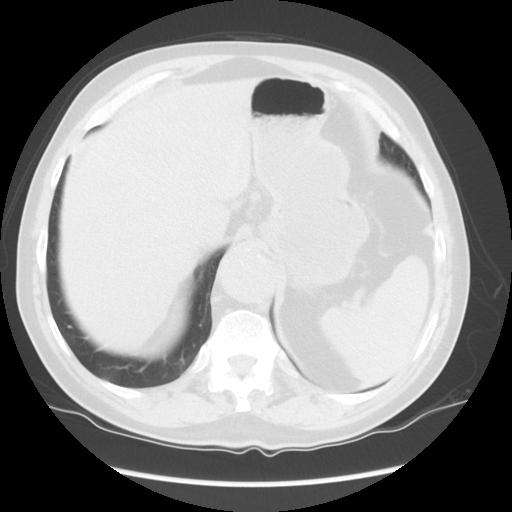
[im 85/95  soft-tissue]
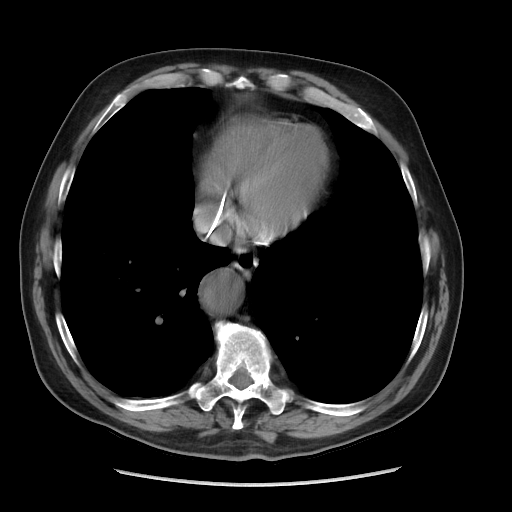
[im 85/95  lung]
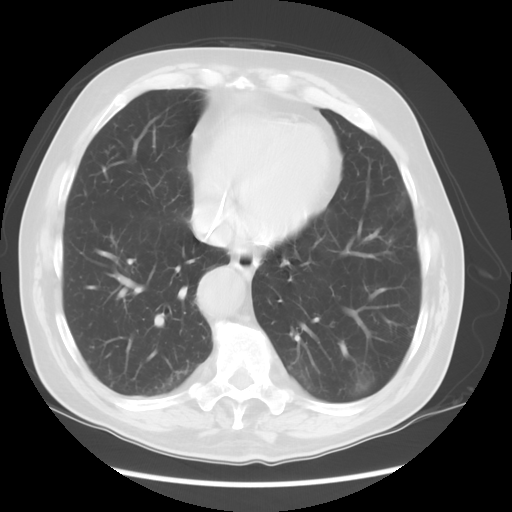
[im 85/95  bone]
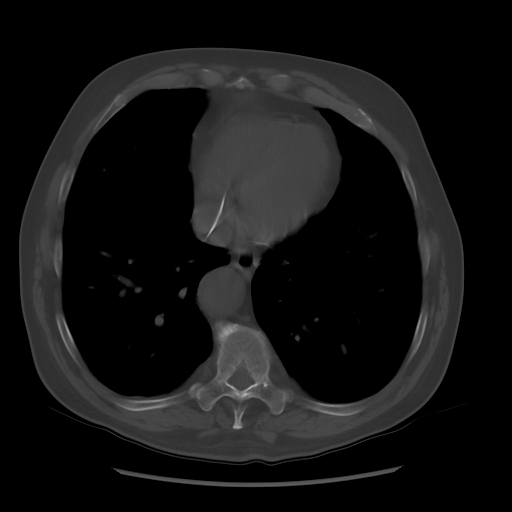

[Series 601: coronal body · coronal · 0.94mm/px · 1 of 133 slices shown, 2 images]
[im 45/133  soft-tissue]
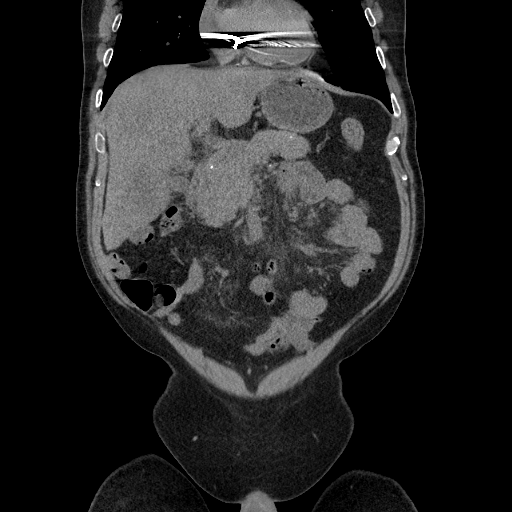
[im 45/133  bone]
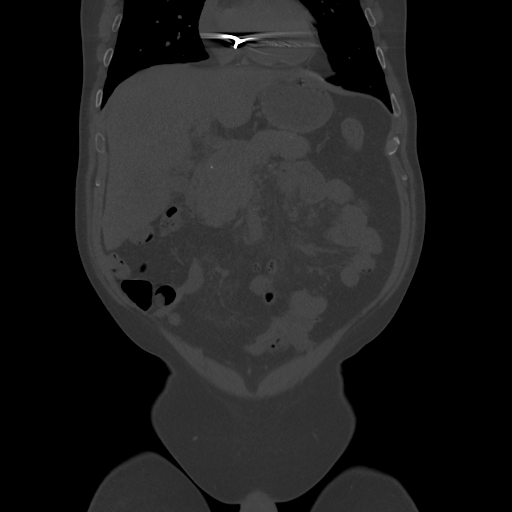

[10 of 36 positions shown; findings below may reference images not displayed]

FINDINGS: Lower chest: Multiple small nodules are noted in the visualized lung
bases, with the largest measuring 6 mm.

Hepatobiliary: Gallbladder appears to be contracted, without
evidence of gallstones. Nearly 3 cm ill-defined low density is noted
inferiorly in right hepatic lobe concerning for possible metastatic
lesion.

Pancreas: There appears to be pancreatic head enlargement concerning
for pancreatic neoplasm or malignancy, although pancreatitis cannot
be excluded.

Spleen: Normal in size without focal abnormality.

Adrenals/Urinary Tract: 2 cm right adrenal nodule is noted. Severe
right renal atrophy is noted. Left kidney and ureter are
unremarkable. Urinary bladder is decompressed.

Stomach/Bowel: There is no evidence of bowel obstruction.

Vascular/Lymphatic: Atherosclerosis of abdominal aorta is noted with
5.6 cm aneurysm noted in suprarenal abdominal aorta. Extensive
adenopathy is noted in the superior retroperitoneal region as well
as probable porta hepatis region concerning for metastatic disease.

Reproductive: Mild prostatic enlargement is noted.

Other: Mild inflammatory stranding is noted in mesenteric region.

Musculoskeletal: No acute or significant osseous findings.
IMPRESSION: Pancreatic head enlargement is noted concerning for pancreatic
neoplasm or malignancy, although pancreatitis cannot be excluded.
Further evaluation with MRI is recommended.

Retroperitoneal and porta hepatis adenopathy is noted concerning for
metastatic disease.

3 cm low density is noted in right hepatic lobe concerning for
possible metastatic lesion. Further evaluation with MRI is
recommended.

5.6 cm aneurysm is seen in the suprarenal abdominal aorta. Vascular
surgery consultation recommended due to increased risk of rupture
for AAA >5.5 cm. This recommendation follows ACR consensus
guidelines: White Paper of the ACR Incidental Findings Committee II
on Vascular Findings. [HOSPITAL] 8907; [DATE].

Multiple pulmonary nodules are noted in visualized lung bases
concerning for possible metastatic disease given the above findings.

These results will be called to the ordering clinician or
representative by the Radiologist Assistant, and communication
documented in the PACS or zVision Dashboard.
